# Patient Record
Sex: Female | Born: 1945
Health system: Southern US, Community
[De-identification: ages and names within clinical notes are randomized; demographics above are authoritative.]

## PROBLEM LIST (undated history)

## (undated) DIAGNOSIS — J189 Pneumonia, unspecified organism: Secondary | ICD-10-CM

## (undated) DIAGNOSIS — F329 Major depressive disorder, single episode, unspecified: Secondary | ICD-10-CM

## (undated) DIAGNOSIS — R51 Headache: Secondary | ICD-10-CM

## (undated) DIAGNOSIS — R0989 Other specified symptoms and signs involving the circulatory and respiratory systems: Secondary | ICD-10-CM

## (undated) DIAGNOSIS — F32A Depression, unspecified: Secondary | ICD-10-CM

## (undated) DIAGNOSIS — M1990 Unspecified osteoarthritis, unspecified site: Secondary | ICD-10-CM

## (undated) DIAGNOSIS — R200 Anesthesia of skin: Secondary | ICD-10-CM

## (undated) DIAGNOSIS — Z8701 Personal history of pneumonia (recurrent): Secondary | ICD-10-CM

## (undated) DIAGNOSIS — F419 Anxiety disorder, unspecified: Secondary | ICD-10-CM

## (undated) DIAGNOSIS — D649 Anemia, unspecified: Secondary | ICD-10-CM

## (undated) DIAGNOSIS — R519 Headache, unspecified: Secondary | ICD-10-CM

## (undated) DIAGNOSIS — E119 Type 2 diabetes mellitus without complications: Secondary | ICD-10-CM

## (undated) HISTORY — PX: EYE SURGERY: SHX253

## (undated) HISTORY — DX: Other specified symptoms and signs involving the circulatory and respiratory systems: R09.89

## (undated) HISTORY — PX: CHOLECYSTECTOMY: SHX55

## (undated) HISTORY — PX: MASTECTOMY: SHX3

## (undated) HISTORY — PX: SPINE SURGERY: SHX786

## (undated) HISTORY — PX: DILATION AND CURETTAGE OF UTERUS: SHX78

## (undated) HISTORY — PX: MENISCUS REPAIR: SHX5179

## (undated) HISTORY — PX: COLONOSCOPY: SHX174

## (undated) HISTORY — DX: Type 2 diabetes mellitus without complications: E11.9

## (undated) HISTORY — PX: BACK SURGERY: SHX140

## (undated) HISTORY — PX: ABDOMINAL HYSTERECTOMY: SHX81

## (undated) HISTORY — PX: ABCESS DRAINAGE: SHX399

---

## 2004-03-26 ENCOUNTER — Encounter: Admission: RE | Admit: 2004-03-26 | Discharge: 2004-03-26 | Payer: Self-pay | Admitting: Orthopedic Surgery

## 2004-03-26 ENCOUNTER — Emergency Department (HOSPITAL_COMMUNITY): Admission: EM | Admit: 2004-03-26 | Discharge: 2004-03-26 | Payer: Self-pay | Admitting: Emergency Medicine

## 2004-12-14 ENCOUNTER — Encounter: Admission: RE | Admit: 2004-12-14 | Discharge: 2005-03-14 | Payer: Self-pay | Admitting: Orthopedic Surgery

## 2005-07-18 ENCOUNTER — Other Ambulatory Visit: Admission: RE | Admit: 2005-07-18 | Discharge: 2005-07-18 | Payer: Self-pay | Admitting: Obstetrics and Gynecology

## 2005-07-27 ENCOUNTER — Encounter: Admission: RE | Admit: 2005-07-27 | Discharge: 2005-07-27 | Payer: Self-pay | Admitting: Obstetrics and Gynecology

## 2006-12-28 ENCOUNTER — Encounter: Admission: RE | Admit: 2006-12-28 | Discharge: 2006-12-28 | Payer: Self-pay | Admitting: Family Medicine

## 2007-01-29 ENCOUNTER — Encounter: Admission: RE | Admit: 2007-01-29 | Discharge: 2007-01-29 | Payer: Self-pay | Admitting: Family Medicine

## 2007-03-13 ENCOUNTER — Encounter: Admission: RE | Admit: 2007-03-13 | Discharge: 2007-03-13 | Payer: Self-pay | Admitting: Family Medicine

## 2007-06-26 ENCOUNTER — Encounter: Admission: RE | Admit: 2007-06-26 | Discharge: 2007-06-26 | Payer: Self-pay | Admitting: Family Medicine

## 2007-07-06 ENCOUNTER — Encounter: Admission: RE | Admit: 2007-07-06 | Discharge: 2007-07-06 | Payer: Self-pay | Admitting: Family Medicine

## 2007-12-31 ENCOUNTER — Encounter: Admission: RE | Admit: 2007-12-31 | Discharge: 2007-12-31 | Payer: Self-pay | Admitting: Obstetrics and Gynecology

## 2008-05-02 ENCOUNTER — Encounter: Admission: RE | Admit: 2008-05-02 | Discharge: 2008-05-02 | Payer: Self-pay | Admitting: Family Medicine

## 2009-01-02 ENCOUNTER — Encounter: Admission: RE | Admit: 2009-01-02 | Discharge: 2009-01-02 | Payer: Self-pay | Admitting: Obstetrics and Gynecology

## 2010-02-17 ENCOUNTER — Other Ambulatory Visit: Admission: RE | Admit: 2010-02-17 | Discharge: 2010-02-17 | Payer: Self-pay | Admitting: Obstetrics and Gynecology

## 2010-03-05 ENCOUNTER — Observation Stay (HOSPITAL_COMMUNITY): Admission: EM | Admit: 2010-03-05 | Discharge: 2010-03-08 | Payer: Self-pay | Admitting: Emergency Medicine

## 2010-03-05 ENCOUNTER — Ambulatory Visit: Payer: Self-pay | Admitting: Internal Medicine

## 2010-03-06 ENCOUNTER — Encounter (INDEPENDENT_AMBULATORY_CARE_PROVIDER_SITE_OTHER): Payer: Self-pay | Admitting: Internal Medicine

## 2010-03-08 ENCOUNTER — Encounter (INDEPENDENT_AMBULATORY_CARE_PROVIDER_SITE_OTHER): Payer: Self-pay | Admitting: Internal Medicine

## 2010-08-01 ENCOUNTER — Encounter: Payer: Self-pay | Admitting: Family Medicine

## 2010-09-24 LAB — DIFFERENTIAL
Basophils Absolute: 0.1 10*3/uL (ref 0.0–0.1)
Basophils Relative: 1 % (ref 0–1)
Eosinophils Absolute: 0.2 10*3/uL (ref 0.0–0.7)
Eosinophils Relative: 2 % (ref 0–5)
Lymphocytes Relative: 56 % — ABNORMAL HIGH (ref 12–46)
Lymphs Abs: 3.7 10*3/uL (ref 0.7–4.0)
Monocytes Absolute: 0.4 10*3/uL (ref 0.1–1.0)
Monocytes Relative: 7 % (ref 3–12)
Neutro Abs: 2.2 10*3/uL (ref 1.7–7.7)
Neutrophils Relative %: 34 % — ABNORMAL LOW (ref 43–77)

## 2010-09-24 LAB — URINE MICROSCOPIC-ADD ON

## 2010-09-24 LAB — POCT CARDIAC MARKERS
CKMB, poc: <1 ng/mL — ABNORMAL LOW (ref 1.0–8.0)
Myoglobin, poc: 41.1 ng/mL (ref 12–200)
Troponin i, poc: <0.05 ng/mL (ref 0.00–0.09)

## 2010-09-24 LAB — COMPREHENSIVE METABOLIC PANEL
ALT: 18 U/L (ref 0–35)
AST: 22 U/L (ref 0–37)
Albumin: 3.6 g/dL (ref 3.5–5.2)
Alkaline Phosphatase: 62 U/L (ref 39–117)
BUN: 7 mg/dL (ref 6–23)
CO2: 27 mEq/L (ref 19–32)
Calcium: 8.7 mg/dL (ref 8.4–10.5)
Chloride: 109 mEq/L (ref 96–112)
Creatinine, Ser: 0.61 mg/dL (ref 0.4–1.2)
GFR calc Af Amer: >60 mL/min (ref >60)
GFR calc non Af Amer: >60 mL/min (ref >60)
Glucose, Bld: 85 mg/dL (ref 70–99)
Potassium: 3.9 mEq/L (ref 3.5–5.1)
Sodium: 143 mEq/L (ref 135–145)
Total Bilirubin: 0.4 mg/dL (ref 0.3–1.2)
Total Protein: 6.1 g/dL (ref 6.0–8.3)

## 2010-09-24 LAB — BASIC METABOLIC PANEL
BUN: 9 mg/dL (ref 6–23)
CO2: 29 mEq/L (ref 19–32)
Calcium: 9.2 mg/dL (ref 8.4–10.5)
Chloride: 107 mEq/L (ref 96–112)
Creatinine, Ser: 0.65 mg/dL (ref 0.4–1.2)
GFR calc Af Amer: >60 mL/min (ref >60)
GFR calc non Af Amer: >60 mL/min (ref >60)
Glucose, Bld: 87 mg/dL (ref 70–99)
Potassium: 3.9 mEq/L (ref 3.5–5.1)
Sodium: 142 mEq/L (ref 135–145)

## 2010-09-24 LAB — URINALYSIS, ROUTINE W REFLEX MICROSCOPIC
Bilirubin Urine: NEGATIVE
Glucose, UA: NEGATIVE mg/dL
Hgb urine dipstick: NEGATIVE
Ketones, ur: NEGATIVE mg/dL
Nitrite: NEGATIVE
Protein, ur: NEGATIVE mg/dL
Specific Gravity, Urine: 1.007 (ref 1.005–1.030)
Urobilinogen, UA: 0.2 mg/dL (ref 0.0–1.0)
pH: 7.5 (ref 5.0–8.0)

## 2010-09-24 LAB — LIPID PANEL
Cholesterol: 136 mg/dL (ref 0–200)
HDL: 70 mg/dL (ref >39)
LDL Cholesterol: 42 mg/dL (ref 0–99)
Total CHOL/HDL Ratio: 1.9 RATIO
Triglycerides: 120 mg/dL (ref <150)
VLDL: 24 mg/dL (ref 0–40)

## 2010-09-24 LAB — CBC
HCT: 41.2 % (ref 36.0–46.0)
Hemoglobin: 13.3 g/dL (ref 12.0–15.0)
MCH: 28.2 pg (ref 26.0–34.0)
MCHC: 32.3 g/dL (ref 30.0–36.0)
MCV: 87.5 fL (ref 78.0–100.0)
Platelets: 214 10*3/uL (ref 150–400)
RBC: 4.71 MIL/uL (ref 3.87–5.11)
RDW: 13.4 % (ref 11.5–15.5)
WBC: 6.6 10*3/uL (ref 4.0–10.5)

## 2010-09-24 LAB — HEMOGLOBIN A1C
Hgb A1c MFr Bld: 6.1 % — ABNORMAL HIGH (ref <5.7)
Mean Plasma Glucose: 128 mg/dL — ABNORMAL HIGH (ref <117)

## 2010-09-24 LAB — TROPONIN I: Troponin I: <0.01 ng/mL (ref 0.00–0.06)

## 2010-09-24 LAB — PROTIME-INR
INR: 1.03 (ref 0.00–1.49)
Prothrombin Time: 13.7 seconds (ref 11.6–15.2)

## 2010-09-24 LAB — CK TOTAL AND CKMB (NOT AT ARMC)
CK, MB: 1.5 ng/mL (ref 0.3–4.0)
Relative Index: 0.7 (ref 0.0–2.5)
Total CK: 208 U/L — ABNORMAL HIGH (ref 7–177)

## 2010-09-24 LAB — GLUCOSE, CAPILLARY
Glucose-Capillary: 126 mg/dL — ABNORMAL HIGH (ref 70–99)
Glucose-Capillary: 94 mg/dL (ref 70–99)

## 2010-09-24 LAB — APTT: aPTT: 27 seconds (ref 24–37)

## 2011-08-03 DIAGNOSIS — H524 Presbyopia: Secondary | ICD-10-CM | POA: Diagnosis not present

## 2011-08-03 DIAGNOSIS — H2510 Age-related nuclear cataract, unspecified eye: Secondary | ICD-10-CM | POA: Diagnosis not present

## 2011-08-03 DIAGNOSIS — H52229 Regular astigmatism, unspecified eye: Secondary | ICD-10-CM | POA: Diagnosis not present

## 2011-08-03 DIAGNOSIS — Z23 Encounter for immunization: Secondary | ICD-10-CM | POA: Diagnosis not present

## 2011-08-03 DIAGNOSIS — H5231 Anisometropia: Secondary | ICD-10-CM | POA: Diagnosis not present

## 2011-08-11 DIAGNOSIS — M259 Joint disorder, unspecified: Secondary | ICD-10-CM | POA: Diagnosis not present

## 2011-08-12 ENCOUNTER — Other Ambulatory Visit: Payer: Self-pay | Admitting: Orthopedic Surgery

## 2011-08-12 DIAGNOSIS — M79671 Pain in right foot: Secondary | ICD-10-CM

## 2011-08-12 DIAGNOSIS — R531 Weakness: Secondary | ICD-10-CM

## 2011-08-15 DIAGNOSIS — M79609 Pain in unspecified limb: Secondary | ICD-10-CM | POA: Diagnosis not present

## 2011-08-15 DIAGNOSIS — R5381 Other malaise: Secondary | ICD-10-CM | POA: Diagnosis not present

## 2011-08-16 ENCOUNTER — Ambulatory Visit
Admission: RE | Admit: 2011-08-16 | Discharge: 2011-08-16 | Disposition: A | Discharge status: Home / Self Care | Payer: Medicare Other | Source: Ambulatory Visit | Attending: Orthopedic Surgery | Admitting: Orthopedic Surgery

## 2011-08-16 DIAGNOSIS — R531 Weakness: Secondary | ICD-10-CM

## 2011-08-16 DIAGNOSIS — R229 Localized swelling, mass and lump, unspecified: Secondary | ICD-10-CM | POA: Diagnosis not present

## 2011-08-16 DIAGNOSIS — M79609 Pain in unspecified limb: Secondary | ICD-10-CM | POA: Diagnosis not present

## 2011-08-16 DIAGNOSIS — M79671 Pain in right foot: Secondary | ICD-10-CM

## 2011-08-16 MED ORDER — GADOBENATE DIMEGLUMINE 529 MG/ML IV SOLN
7.0000 mL | Freq: Once | INTRAVENOUS | Status: AC | PRN
  Administered 2011-08-16: 7 mL via INTRAVENOUS

## 2011-08-25 DIAGNOSIS — M259 Joint disorder, unspecified: Secondary | ICD-10-CM | POA: Diagnosis not present

## 2011-10-14 DIAGNOSIS — A6000 Herpesviral infection of urogenital system, unspecified: Secondary | ICD-10-CM | POA: Diagnosis not present

## 2011-10-14 DIAGNOSIS — Z1211 Encounter for screening for malignant neoplasm of colon: Secondary | ICD-10-CM | POA: Diagnosis not present

## 2011-10-14 DIAGNOSIS — Z23 Encounter for immunization: Secondary | ICD-10-CM | POA: Diagnosis not present

## 2011-10-14 DIAGNOSIS — G5600 Carpal tunnel syndrome, unspecified upper limb: Secondary | ICD-10-CM | POA: Diagnosis not present

## 2011-10-14 DIAGNOSIS — Z79899 Other long term (current) drug therapy: Secondary | ICD-10-CM | POA: Diagnosis not present

## 2011-10-14 DIAGNOSIS — M1990 Unspecified osteoarthritis, unspecified site: Secondary | ICD-10-CM | POA: Diagnosis not present

## 2011-10-14 DIAGNOSIS — Z0000 Encounter for general adult medical examination without abnormal findings: Secondary | ICD-10-CM | POA: Diagnosis not present

## 2011-10-14 DIAGNOSIS — F329 Major depressive disorder, single episode, unspecified: Secondary | ICD-10-CM | POA: Diagnosis not present

## 2011-10-14 DIAGNOSIS — R3915 Urgency of urination: Secondary | ICD-10-CM | POA: Diagnosis not present

## 2011-10-19 DIAGNOSIS — M1710 Unilateral primary osteoarthritis, unspecified knee: Secondary | ICD-10-CM | POA: Diagnosis not present

## 2011-10-28 DIAGNOSIS — Z1211 Encounter for screening for malignant neoplasm of colon: Secondary | ICD-10-CM | POA: Diagnosis not present

## 2011-11-09 DIAGNOSIS — L723 Sebaceous cyst: Secondary | ICD-10-CM | POA: Diagnosis not present

## 2011-12-25 DIAGNOSIS — J069 Acute upper respiratory infection, unspecified: Secondary | ICD-10-CM | POA: Diagnosis not present

## 2012-04-29 DIAGNOSIS — Z23 Encounter for immunization: Secondary | ICD-10-CM | POA: Diagnosis not present

## 2012-05-09 ENCOUNTER — Other Ambulatory Visit: Payer: Self-pay | Admitting: Family Medicine

## 2012-05-09 ENCOUNTER — Ambulatory Visit
Admission: RE | Admit: 2012-05-09 | Discharge: 2012-05-09 | Disposition: A | Discharge status: Home / Self Care | Payer: Medicare Other | Source: Ambulatory Visit | Attending: Family Medicine | Admitting: Family Medicine

## 2012-05-09 DIAGNOSIS — F411 Generalized anxiety disorder: Secondary | ICD-10-CM | POA: Diagnosis not present

## 2012-05-09 DIAGNOSIS — M549 Dorsalgia, unspecified: Secondary | ICD-10-CM

## 2012-05-09 DIAGNOSIS — R32 Unspecified urinary incontinence: Secondary | ICD-10-CM | POA: Diagnosis not present

## 2012-05-09 DIAGNOSIS — M545 Low back pain, unspecified: Secondary | ICD-10-CM | POA: Diagnosis not present

## 2012-06-28 DIAGNOSIS — J019 Acute sinusitis, unspecified: Secondary | ICD-10-CM | POA: Diagnosis not present

## 2012-07-26 DIAGNOSIS — M25569 Pain in unspecified knee: Secondary | ICD-10-CM | POA: Diagnosis not present

## 2012-08-03 DIAGNOSIS — J329 Chronic sinusitis, unspecified: Secondary | ICD-10-CM | POA: Diagnosis not present

## 2012-08-03 DIAGNOSIS — R509 Fever, unspecified: Secondary | ICD-10-CM | POA: Diagnosis not present

## 2012-09-24 ENCOUNTER — Other Ambulatory Visit: Payer: Self-pay | Admitting: Orthopedic Surgery

## 2012-09-24 DIAGNOSIS — M25562 Pain in left knee: Secondary | ICD-10-CM

## 2012-09-24 DIAGNOSIS — R609 Edema, unspecified: Secondary | ICD-10-CM

## 2012-09-24 DIAGNOSIS — R531 Weakness: Secondary | ICD-10-CM

## 2012-09-26 ENCOUNTER — Ambulatory Visit
Admission: RE | Admit: 2012-09-26 | Discharge: 2012-09-26 | Disposition: A | Discharge status: Home / Self Care | Payer: Medicare Other | Source: Ambulatory Visit | Attending: Orthopedic Surgery | Admitting: Orthopedic Surgery

## 2012-09-26 DIAGNOSIS — M25562 Pain in left knee: Secondary | ICD-10-CM

## 2012-09-26 DIAGNOSIS — M1710 Unilateral primary osteoarthritis, unspecified knee: Secondary | ICD-10-CM | POA: Diagnosis not present

## 2012-09-26 DIAGNOSIS — R531 Weakness: Secondary | ICD-10-CM

## 2012-09-26 DIAGNOSIS — M23359 Other meniscus derangements, posterior horn of lateral meniscus, unspecified knee: Secondary | ICD-10-CM | POA: Diagnosis not present

## 2012-09-26 DIAGNOSIS — IMO0002 Reserved for concepts with insufficient information to code with codable children: Secondary | ICD-10-CM | POA: Diagnosis not present

## 2012-09-26 DIAGNOSIS — R609 Edema, unspecified: Secondary | ICD-10-CM

## 2012-10-08 DIAGNOSIS — M23359 Other meniscus derangements, posterior horn of lateral meniscus, unspecified knee: Secondary | ICD-10-CM | POA: Diagnosis not present

## 2012-10-18 DIAGNOSIS — A088 Other specified intestinal infections: Secondary | ICD-10-CM | POA: Diagnosis not present

## 2012-10-22 DIAGNOSIS — Y929 Unspecified place or not applicable: Secondary | ICD-10-CM | POA: Diagnosis not present

## 2012-10-22 DIAGNOSIS — M659 Synovitis and tenosynovitis, unspecified: Secondary | ICD-10-CM | POA: Diagnosis not present

## 2012-10-22 DIAGNOSIS — Y999 Unspecified external cause status: Secondary | ICD-10-CM | POA: Diagnosis not present

## 2012-10-22 DIAGNOSIS — M2240 Chondromalacia patellae, unspecified knee: Secondary | ICD-10-CM | POA: Diagnosis not present

## 2012-10-22 DIAGNOSIS — M9420 Chondromalacia, unspecified site: Secondary | ICD-10-CM | POA: Diagnosis not present

## 2012-10-22 DIAGNOSIS — X58XXXA Exposure to other specified factors, initial encounter: Secondary | ICD-10-CM | POA: Diagnosis not present

## 2012-10-22 DIAGNOSIS — S83289A Other tear of lateral meniscus, current injury, unspecified knee, initial encounter: Secondary | ICD-10-CM | POA: Diagnosis not present

## 2012-10-22 DIAGNOSIS — M23359 Other meniscus derangements, posterior horn of lateral meniscus, unspecified knee: Secondary | ICD-10-CM | POA: Diagnosis not present

## 2012-10-22 DIAGNOSIS — M1710 Unilateral primary osteoarthritis, unspecified knee: Secondary | ICD-10-CM | POA: Diagnosis not present

## 2012-11-01 ENCOUNTER — Other Ambulatory Visit: Payer: Self-pay | Admitting: Family Medicine

## 2012-11-01 DIAGNOSIS — Z0000 Encounter for general adult medical examination without abnormal findings: Secondary | ICD-10-CM | POA: Diagnosis not present

## 2012-11-01 DIAGNOSIS — F329 Major depressive disorder, single episode, unspecified: Secondary | ICD-10-CM | POA: Diagnosis not present

## 2012-11-01 DIAGNOSIS — Z1231 Encounter for screening mammogram for malignant neoplasm of breast: Secondary | ICD-10-CM

## 2012-11-01 DIAGNOSIS — A6000 Herpesviral infection of urogenital system, unspecified: Secondary | ICD-10-CM | POA: Diagnosis not present

## 2012-11-01 DIAGNOSIS — Z13220 Encounter for screening for lipoid disorders: Secondary | ICD-10-CM | POA: Diagnosis not present

## 2012-11-01 DIAGNOSIS — Z79899 Other long term (current) drug therapy: Secondary | ICD-10-CM | POA: Diagnosis not present

## 2012-11-01 DIAGNOSIS — M1990 Unspecified osteoarthritis, unspecified site: Secondary | ICD-10-CM | POA: Diagnosis not present

## 2012-11-01 DIAGNOSIS — M949 Disorder of cartilage, unspecified: Secondary | ICD-10-CM | POA: Diagnosis not present

## 2012-11-01 DIAGNOSIS — M899 Disorder of bone, unspecified: Secondary | ICD-10-CM | POA: Diagnosis not present

## 2012-11-01 DIAGNOSIS — Z1211 Encounter for screening for malignant neoplasm of colon: Secondary | ICD-10-CM | POA: Diagnosis not present

## 2012-11-02 DIAGNOSIS — Z1211 Encounter for screening for malignant neoplasm of colon: Secondary | ICD-10-CM | POA: Diagnosis not present

## 2012-11-08 ENCOUNTER — Ambulatory Visit
Admission: RE | Admit: 2012-11-08 | Discharge: 2012-11-08 | Disposition: A | Discharge status: Home / Self Care | Payer: Medicare Other | Source: Ambulatory Visit | Attending: Family Medicine | Admitting: Family Medicine

## 2012-11-08 DIAGNOSIS — Z1231 Encounter for screening mammogram for malignant neoplasm of breast: Secondary | ICD-10-CM | POA: Diagnosis not present

## 2012-11-19 ENCOUNTER — Other Ambulatory Visit (HOSPITAL_COMMUNITY): Payer: Self-pay | Admitting: Orthopedic Surgery

## 2012-11-19 ENCOUNTER — Ambulatory Visit (HOSPITAL_COMMUNITY)
Admission: RE | Admit: 2012-11-19 | Discharge: 2012-11-19 | Disposition: A | Discharge status: Home / Self Care | Payer: Medicare Other | Source: Ambulatory Visit | Attending: Orthopedic Surgery | Admitting: Orthopedic Surgery

## 2012-11-19 DIAGNOSIS — M7989 Other specified soft tissue disorders: Secondary | ICD-10-CM

## 2012-11-19 DIAGNOSIS — M7120 Synovial cyst of popliteal space [Baker], unspecified knee: Secondary | ICD-10-CM | POA: Insufficient documentation

## 2012-11-19 DIAGNOSIS — M25562 Pain in left knee: Secondary | ICD-10-CM

## 2012-11-19 DIAGNOSIS — M79609 Pain in unspecified limb: Secondary | ICD-10-CM | POA: Insufficient documentation

## 2012-11-19 NOTE — Progress Notes (Signed)
*  PRELIMINARY RESULTS* Vascular Ultrasound Left lower extremity venous duplex has been completed.  Preliminary findings: Left = no evidence of DVT. A ruptured baker's cyst is noted extending from the popliteal fossa to the proximal calf.  Called report to [facility] at Dr. Diamantina Providence office.   Farrel Demark, RDMS, RVT  11/19/2012, 4:11 PM

## 2012-11-28 DIAGNOSIS — M23359 Other meniscus derangements, posterior horn of lateral meniscus, unspecified knee: Secondary | ICD-10-CM | POA: Diagnosis not present

## 2012-12-06 DIAGNOSIS — M899 Disorder of bone, unspecified: Secondary | ICD-10-CM | POA: Diagnosis not present

## 2013-01-23 DIAGNOSIS — M25569 Pain in unspecified knee: Secondary | ICD-10-CM | POA: Diagnosis not present

## 2013-01-23 DIAGNOSIS — M1710 Unilateral primary osteoarthritis, unspecified knee: Secondary | ICD-10-CM | POA: Diagnosis not present

## 2013-01-23 DIAGNOSIS — M23359 Other meniscus derangements, posterior horn of lateral meniscus, unspecified knee: Secondary | ICD-10-CM | POA: Diagnosis not present

## 2013-05-08 DIAGNOSIS — F329 Major depressive disorder, single episode, unspecified: Secondary | ICD-10-CM | POA: Diagnosis not present

## 2013-06-30 DIAGNOSIS — Z23 Encounter for immunization: Secondary | ICD-10-CM | POA: Diagnosis not present

## 2013-07-31 ENCOUNTER — Ambulatory Visit (HOSPITAL_COMMUNITY): Payer: Medicare Other | Admitting: Psychiatry

## 2013-09-18 DIAGNOSIS — J329 Chronic sinusitis, unspecified: Secondary | ICD-10-CM | POA: Diagnosis not present

## 2013-11-15 ENCOUNTER — Other Ambulatory Visit: Payer: Self-pay

## 2013-11-15 DIAGNOSIS — Z1231 Encounter for screening mammogram for malignant neoplasm of breast: Secondary | ICD-10-CM

## 2013-11-19 DIAGNOSIS — H11159 Pinguecula, unspecified eye: Secondary | ICD-10-CM | POA: Diagnosis not present

## 2013-11-19 DIAGNOSIS — H2510 Age-related nuclear cataract, unspecified eye: Secondary | ICD-10-CM | POA: Diagnosis not present

## 2013-11-19 DIAGNOSIS — H524 Presbyopia: Secondary | ICD-10-CM | POA: Diagnosis not present

## 2013-11-19 DIAGNOSIS — H5231 Anisometropia: Secondary | ICD-10-CM | POA: Diagnosis not present

## 2013-11-22 ENCOUNTER — Encounter (INDEPENDENT_AMBULATORY_CARE_PROVIDER_SITE_OTHER): Payer: Self-pay

## 2013-11-22 ENCOUNTER — Ambulatory Visit
Admission: RE | Admit: 2013-11-22 | Discharge: 2013-11-22 | Disposition: A | Discharge status: Home / Self Care | Payer: Medicare Other | Source: Ambulatory Visit

## 2013-11-22 DIAGNOSIS — Z1231 Encounter for screening mammogram for malignant neoplasm of breast: Secondary | ICD-10-CM | POA: Diagnosis not present

## 2013-12-03 ENCOUNTER — Other Ambulatory Visit: Payer: Self-pay | Admitting: Family Medicine

## 2013-12-03 ENCOUNTER — Ambulatory Visit
Admission: RE | Admit: 2013-12-03 | Discharge: 2013-12-03 | Disposition: A | Discharge status: Home / Self Care | Payer: Medicare Other | Source: Ambulatory Visit | Attending: Family Medicine | Admitting: Family Medicine

## 2013-12-03 DIAGNOSIS — R229 Localized swelling, mass and lump, unspecified: Secondary | ICD-10-CM | POA: Diagnosis not present

## 2013-12-03 DIAGNOSIS — M47812 Spondylosis without myelopathy or radiculopathy, cervical region: Secondary | ICD-10-CM | POA: Diagnosis not present

## 2013-12-03 DIAGNOSIS — M25519 Pain in unspecified shoulder: Secondary | ICD-10-CM | POA: Diagnosis not present

## 2013-12-03 DIAGNOSIS — M25512 Pain in left shoulder: Secondary | ICD-10-CM

## 2013-12-03 DIAGNOSIS — M542 Cervicalgia: Secondary | ICD-10-CM

## 2013-12-04 DIAGNOSIS — M25569 Pain in unspecified knee: Secondary | ICD-10-CM | POA: Diagnosis not present

## 2013-12-04 DIAGNOSIS — M25519 Pain in unspecified shoulder: Secondary | ICD-10-CM | POA: Diagnosis not present

## 2013-12-05 ENCOUNTER — Ambulatory Visit
Admission: RE | Admit: 2013-12-05 | Discharge: 2013-12-05 | Disposition: A | Discharge status: Home / Self Care | Payer: Medicare Other | Source: Ambulatory Visit | Attending: Family Medicine | Admitting: Family Medicine

## 2013-12-05 DIAGNOSIS — D1779 Benign lipomatous neoplasm of other sites: Secondary | ICD-10-CM | POA: Diagnosis not present

## 2013-12-05 DIAGNOSIS — R229 Localized swelling, mass and lump, unspecified: Secondary | ICD-10-CM

## 2014-01-20 DIAGNOSIS — K1370 Unspecified lesions of oral mucosa: Secondary | ICD-10-CM | POA: Diagnosis not present

## 2014-01-20 DIAGNOSIS — D179 Benign lipomatous neoplasm, unspecified: Secondary | ICD-10-CM | POA: Diagnosis not present

## 2014-03-10 DIAGNOSIS — M1710 Unilateral primary osteoarthritis, unspecified knee: Secondary | ICD-10-CM | POA: Diagnosis not present

## 2014-05-12 DIAGNOSIS — D3709 Neoplasm of uncertain behavior of other specified sites of the oral cavity: Secondary | ICD-10-CM | POA: Diagnosis not present

## 2014-06-11 ENCOUNTER — Other Ambulatory Visit: Payer: Self-pay | Admitting: Otolaryngology

## 2014-06-11 DIAGNOSIS — D210 Benign neoplasm of connective and other soft tissue of head, face and neck: Secondary | ICD-10-CM | POA: Diagnosis not present

## 2014-06-11 DIAGNOSIS — D3709 Neoplasm of uncertain behavior of other specified sites of the oral cavity: Secondary | ICD-10-CM | POA: Diagnosis not present

## 2014-06-25 DIAGNOSIS — R609 Edema, unspecified: Secondary | ICD-10-CM | POA: Diagnosis not present

## 2014-06-25 DIAGNOSIS — M791 Myalgia: Secondary | ICD-10-CM | POA: Diagnosis not present

## 2014-06-25 DIAGNOSIS — Z23 Encounter for immunization: Secondary | ICD-10-CM | POA: Diagnosis not present

## 2014-07-02 DIAGNOSIS — M1712 Unilateral primary osteoarthritis, left knee: Secondary | ICD-10-CM | POA: Diagnosis not present

## 2014-07-02 DIAGNOSIS — M1711 Unilateral primary osteoarthritis, right knee: Secondary | ICD-10-CM | POA: Diagnosis not present

## 2014-07-02 DIAGNOSIS — M545 Low back pain: Secondary | ICD-10-CM | POA: Diagnosis not present

## 2014-07-09 ENCOUNTER — Other Ambulatory Visit: Payer: Self-pay | Admitting: Orthopedic Surgery

## 2014-07-09 DIAGNOSIS — M545 Low back pain: Secondary | ICD-10-CM

## 2014-07-13 ENCOUNTER — Ambulatory Visit
Admission: RE | Admit: 2014-07-13 | Discharge: 2014-07-13 | Disposition: A | Discharge status: Home / Self Care | Payer: Medicare Other | Source: Ambulatory Visit | Attending: Orthopedic Surgery | Admitting: Orthopedic Surgery

## 2014-07-13 DIAGNOSIS — M545 Low back pain: Secondary | ICD-10-CM

## 2014-07-13 DIAGNOSIS — M47816 Spondylosis without myelopathy or radiculopathy, lumbar region: Secondary | ICD-10-CM | POA: Diagnosis not present

## 2014-07-13 DIAGNOSIS — M4806 Spinal stenosis, lumbar region: Secondary | ICD-10-CM | POA: Diagnosis not present

## 2014-07-18 DIAGNOSIS — M545 Low back pain: Secondary | ICD-10-CM | POA: Diagnosis not present

## 2014-07-18 DIAGNOSIS — M4726 Other spondylosis with radiculopathy, lumbar region: Secondary | ICD-10-CM | POA: Diagnosis not present

## 2014-07-23 DIAGNOSIS — F322 Major depressive disorder, single episode, severe without psychotic features: Secondary | ICD-10-CM | POA: Diagnosis not present

## 2014-07-23 DIAGNOSIS — Z1211 Encounter for screening for malignant neoplasm of colon: Secondary | ICD-10-CM | POA: Diagnosis not present

## 2014-07-23 DIAGNOSIS — Z0000 Encounter for general adult medical examination without abnormal findings: Secondary | ICD-10-CM | POA: Diagnosis not present

## 2014-07-23 DIAGNOSIS — J301 Allergic rhinitis due to pollen: Secondary | ICD-10-CM | POA: Diagnosis not present

## 2014-07-23 DIAGNOSIS — Z79899 Other long term (current) drug therapy: Secondary | ICD-10-CM | POA: Diagnosis not present

## 2014-07-23 DIAGNOSIS — M8580 Other specified disorders of bone density and structure, unspecified site: Secondary | ICD-10-CM | POA: Diagnosis not present

## 2014-07-23 DIAGNOSIS — M1990 Unspecified osteoarthritis, unspecified site: Secondary | ICD-10-CM | POA: Diagnosis not present

## 2014-07-23 DIAGNOSIS — R6889 Other general symptoms and signs: Secondary | ICD-10-CM | POA: Diagnosis not present

## 2014-07-23 DIAGNOSIS — A6000 Herpesviral infection of urogenital system, unspecified: Secondary | ICD-10-CM | POA: Diagnosis not present

## 2014-07-23 DIAGNOSIS — M549 Dorsalgia, unspecified: Secondary | ICD-10-CM | POA: Diagnosis not present

## 2014-07-23 DIAGNOSIS — Z23 Encounter for immunization: Secondary | ICD-10-CM | POA: Diagnosis not present

## 2014-07-31 DIAGNOSIS — Z1211 Encounter for screening for malignant neoplasm of colon: Secondary | ICD-10-CM | POA: Diagnosis not present

## 2014-09-28 DIAGNOSIS — J209 Acute bronchitis, unspecified: Secondary | ICD-10-CM | POA: Diagnosis not present

## 2014-10-08 DIAGNOSIS — K921 Melena: Secondary | ICD-10-CM | POA: Diagnosis not present

## 2014-10-14 ENCOUNTER — Other Ambulatory Visit: Payer: Self-pay | Admitting: Gastroenterology

## 2014-10-14 DIAGNOSIS — K635 Polyp of colon: Secondary | ICD-10-CM | POA: Diagnosis not present

## 2014-10-14 DIAGNOSIS — D125 Benign neoplasm of sigmoid colon: Secondary | ICD-10-CM | POA: Diagnosis not present

## 2014-10-14 DIAGNOSIS — K5730 Diverticulosis of large intestine without perforation or abscess without bleeding: Secondary | ICD-10-CM | POA: Diagnosis not present

## 2014-10-14 DIAGNOSIS — K921 Melena: Secondary | ICD-10-CM | POA: Diagnosis not present

## 2014-10-20 DIAGNOSIS — F322 Major depressive disorder, single episode, severe without psychotic features: Secondary | ICD-10-CM | POA: Diagnosis not present

## 2014-11-05 DIAGNOSIS — M4726 Other spondylosis with radiculopathy, lumbar region: Secondary | ICD-10-CM | POA: Diagnosis not present

## 2014-11-05 DIAGNOSIS — M545 Low back pain: Secondary | ICD-10-CM | POA: Diagnosis not present

## 2014-11-25 DIAGNOSIS — Z6831 Body mass index (BMI) 31.0-31.9, adult: Secondary | ICD-10-CM | POA: Diagnosis not present

## 2014-11-25 DIAGNOSIS — M5137 Other intervertebral disc degeneration, lumbosacral region: Secondary | ICD-10-CM | POA: Diagnosis not present

## 2014-11-25 DIAGNOSIS — M5416 Radiculopathy, lumbar region: Secondary | ICD-10-CM | POA: Diagnosis not present

## 2015-01-01 DIAGNOSIS — M6281 Muscle weakness (generalized): Secondary | ICD-10-CM | POA: Diagnosis not present

## 2015-01-01 DIAGNOSIS — M79604 Pain in right leg: Secondary | ICD-10-CM | POA: Diagnosis not present

## 2015-01-01 DIAGNOSIS — M545 Low back pain: Secondary | ICD-10-CM | POA: Diagnosis not present

## 2015-01-01 DIAGNOSIS — R262 Difficulty in walking, not elsewhere classified: Secondary | ICD-10-CM | POA: Diagnosis not present

## 2015-01-06 DIAGNOSIS — M545 Low back pain: Secondary | ICD-10-CM | POA: Diagnosis not present

## 2015-01-06 DIAGNOSIS — M79604 Pain in right leg: Secondary | ICD-10-CM | POA: Diagnosis not present

## 2015-01-06 DIAGNOSIS — M6281 Muscle weakness (generalized): Secondary | ICD-10-CM | POA: Diagnosis not present

## 2015-01-06 DIAGNOSIS — R262 Difficulty in walking, not elsewhere classified: Secondary | ICD-10-CM | POA: Diagnosis not present

## 2015-01-08 DIAGNOSIS — M79604 Pain in right leg: Secondary | ICD-10-CM | POA: Diagnosis not present

## 2015-01-08 DIAGNOSIS — M545 Low back pain: Secondary | ICD-10-CM | POA: Diagnosis not present

## 2015-01-08 DIAGNOSIS — R262 Difficulty in walking, not elsewhere classified: Secondary | ICD-10-CM | POA: Diagnosis not present

## 2015-01-08 DIAGNOSIS — M6281 Muscle weakness (generalized): Secondary | ICD-10-CM | POA: Diagnosis not present

## 2015-01-13 DIAGNOSIS — R262 Difficulty in walking, not elsewhere classified: Secondary | ICD-10-CM | POA: Diagnosis not present

## 2015-01-13 DIAGNOSIS — M545 Low back pain: Secondary | ICD-10-CM | POA: Diagnosis not present

## 2015-01-13 DIAGNOSIS — M79604 Pain in right leg: Secondary | ICD-10-CM | POA: Diagnosis not present

## 2015-01-13 DIAGNOSIS — M6281 Muscle weakness (generalized): Secondary | ICD-10-CM | POA: Diagnosis not present

## 2015-01-15 DIAGNOSIS — Z6830 Body mass index (BMI) 30.0-30.9, adult: Secondary | ICD-10-CM | POA: Diagnosis not present

## 2015-01-15 DIAGNOSIS — M5137 Other intervertebral disc degeneration, lumbosacral region: Secondary | ICD-10-CM | POA: Diagnosis not present

## 2015-01-20 DIAGNOSIS — M79604 Pain in right leg: Secondary | ICD-10-CM | POA: Diagnosis not present

## 2015-01-20 DIAGNOSIS — M6281 Muscle weakness (generalized): Secondary | ICD-10-CM | POA: Diagnosis not present

## 2015-01-20 DIAGNOSIS — M545 Low back pain: Secondary | ICD-10-CM | POA: Diagnosis not present

## 2015-01-20 DIAGNOSIS — R262 Difficulty in walking, not elsewhere classified: Secondary | ICD-10-CM | POA: Diagnosis not present

## 2015-01-22 DIAGNOSIS — H5201 Hypermetropia, right eye: Secondary | ICD-10-CM | POA: Diagnosis not present

## 2015-01-22 DIAGNOSIS — M545 Low back pain: Secondary | ICD-10-CM | POA: Diagnosis not present

## 2015-01-22 DIAGNOSIS — H5212 Myopia, left eye: Secondary | ICD-10-CM | POA: Diagnosis not present

## 2015-01-22 DIAGNOSIS — R262 Difficulty in walking, not elsewhere classified: Secondary | ICD-10-CM | POA: Diagnosis not present

## 2015-01-22 DIAGNOSIS — M79604 Pain in right leg: Secondary | ICD-10-CM | POA: Diagnosis not present

## 2015-01-22 DIAGNOSIS — H04123 Dry eye syndrome of bilateral lacrimal glands: Secondary | ICD-10-CM | POA: Diagnosis not present

## 2015-01-22 DIAGNOSIS — H524 Presbyopia: Secondary | ICD-10-CM | POA: Diagnosis not present

## 2015-01-22 DIAGNOSIS — H52222 Regular astigmatism, left eye: Secondary | ICD-10-CM | POA: Diagnosis not present

## 2015-01-22 DIAGNOSIS — M6281 Muscle weakness (generalized): Secondary | ICD-10-CM | POA: Diagnosis not present

## 2015-01-27 DIAGNOSIS — R262 Difficulty in walking, not elsewhere classified: Secondary | ICD-10-CM | POA: Diagnosis not present

## 2015-01-27 DIAGNOSIS — M6281 Muscle weakness (generalized): Secondary | ICD-10-CM | POA: Diagnosis not present

## 2015-01-27 DIAGNOSIS — M545 Low back pain: Secondary | ICD-10-CM | POA: Diagnosis not present

## 2015-01-27 DIAGNOSIS — M79604 Pain in right leg: Secondary | ICD-10-CM | POA: Diagnosis not present

## 2015-02-03 DIAGNOSIS — M545 Low back pain: Secondary | ICD-10-CM | POA: Diagnosis not present

## 2015-02-03 DIAGNOSIS — R262 Difficulty in walking, not elsewhere classified: Secondary | ICD-10-CM | POA: Diagnosis not present

## 2015-02-03 DIAGNOSIS — M79604 Pain in right leg: Secondary | ICD-10-CM | POA: Diagnosis not present

## 2015-02-03 DIAGNOSIS — M6281 Muscle weakness (generalized): Secondary | ICD-10-CM | POA: Diagnosis not present

## 2015-02-05 DIAGNOSIS — R262 Difficulty in walking, not elsewhere classified: Secondary | ICD-10-CM | POA: Diagnosis not present

## 2015-02-05 DIAGNOSIS — M6281 Muscle weakness (generalized): Secondary | ICD-10-CM | POA: Diagnosis not present

## 2015-02-05 DIAGNOSIS — M79604 Pain in right leg: Secondary | ICD-10-CM | POA: Diagnosis not present

## 2015-02-05 DIAGNOSIS — M545 Low back pain: Secondary | ICD-10-CM | POA: Diagnosis not present

## 2015-02-15 DIAGNOSIS — M79671 Pain in right foot: Secondary | ICD-10-CM | POA: Diagnosis not present

## 2015-02-17 DIAGNOSIS — M79604 Pain in right leg: Secondary | ICD-10-CM | POA: Diagnosis not present

## 2015-02-17 DIAGNOSIS — R262 Difficulty in walking, not elsewhere classified: Secondary | ICD-10-CM | POA: Diagnosis not present

## 2015-02-17 DIAGNOSIS — M6281 Muscle weakness (generalized): Secondary | ICD-10-CM | POA: Diagnosis not present

## 2015-02-17 DIAGNOSIS — M545 Low back pain: Secondary | ICD-10-CM | POA: Diagnosis not present

## 2015-02-19 ENCOUNTER — Other Ambulatory Visit: Payer: Self-pay

## 2015-02-19 DIAGNOSIS — Z1231 Encounter for screening mammogram for malignant neoplasm of breast: Secondary | ICD-10-CM

## 2015-02-24 DIAGNOSIS — M6281 Muscle weakness (generalized): Secondary | ICD-10-CM | POA: Diagnosis not present

## 2015-02-24 DIAGNOSIS — M545 Low back pain: Secondary | ICD-10-CM | POA: Diagnosis not present

## 2015-02-24 DIAGNOSIS — R262 Difficulty in walking, not elsewhere classified: Secondary | ICD-10-CM | POA: Diagnosis not present

## 2015-02-24 DIAGNOSIS — M79604 Pain in right leg: Secondary | ICD-10-CM | POA: Diagnosis not present

## 2015-02-25 DIAGNOSIS — R262 Difficulty in walking, not elsewhere classified: Secondary | ICD-10-CM | POA: Diagnosis not present

## 2015-02-25 DIAGNOSIS — M79604 Pain in right leg: Secondary | ICD-10-CM | POA: Diagnosis not present

## 2015-02-25 DIAGNOSIS — M6281 Muscle weakness (generalized): Secondary | ICD-10-CM | POA: Diagnosis not present

## 2015-02-25 DIAGNOSIS — M545 Low back pain: Secondary | ICD-10-CM | POA: Diagnosis not present

## 2015-03-10 DIAGNOSIS — M545 Low back pain: Secondary | ICD-10-CM | POA: Diagnosis not present

## 2015-03-10 DIAGNOSIS — M79604 Pain in right leg: Secondary | ICD-10-CM | POA: Diagnosis not present

## 2015-03-10 DIAGNOSIS — R262 Difficulty in walking, not elsewhere classified: Secondary | ICD-10-CM | POA: Diagnosis not present

## 2015-03-10 DIAGNOSIS — M6281 Muscle weakness (generalized): Secondary | ICD-10-CM | POA: Diagnosis not present

## 2015-03-17 ENCOUNTER — Other Ambulatory Visit: Payer: Self-pay | Admitting: Neurosurgery

## 2015-03-17 DIAGNOSIS — M4316 Spondylolisthesis, lumbar region: Secondary | ICD-10-CM | POA: Diagnosis not present

## 2015-03-17 DIAGNOSIS — M4806 Spinal stenosis, lumbar region: Secondary | ICD-10-CM | POA: Diagnosis not present

## 2015-03-19 ENCOUNTER — Ambulatory Visit
Admission: RE | Admit: 2015-03-19 | Discharge: 2015-03-19 | Disposition: A | Discharge status: Home / Self Care | Payer: Medicare Other | Source: Ambulatory Visit

## 2015-03-19 DIAGNOSIS — Z1231 Encounter for screening mammogram for malignant neoplasm of breast: Secondary | ICD-10-CM

## 2015-05-21 NOTE — Pre-Procedure Instructions (Addendum)
    Haydee Standing  05/21/2015      [id number] [place], [place] [phone] [facility] [address] [place] [address] Phone: [phone] Fax: [phone]    Your procedure is scheduled on Monday, November 21st, 2016.  Report to [facility] Admitting at 5:30 A.M.  Call this number if you have problems the morning of surgery:  [phone]   Remember:  Do not eat food or drink liquids after midnight.   Take these medicines the morning of surgery with A SIP OF WATER: Acetaminophen-codeine (Tylenol #3), Refresh Tears, Sertraline (Zoloft), Nasocort Allergy  7 days prior to surgery, stop taking: Meloxicam (Mobic), Aspirin, Aleve, Naproxen, Ibuprofen, Advil, Motrin, NSAIDS, fish oil, all herbal medications, and all vitamins.    Do not wear jewelry, make-up or nail polish.  Do not wear lotions, powders, or perfumes.  You may NOT wear deodorant.  Do not shave 48 hours prior to surgery.    Do not bring valuables to the hospital.  [facility] is not responsible for any belongings or valuables.  Contacts, dentures or bridgework may not be worn into surgery.  Leave your suitcase in the car.  After surgery it may be brought to your room.  For patients admitted to the hospital, discharge time will be determined by your treatment team.  Patients discharged the day of surgery will not be allowed to drive home.   Special instructions:  See attached.   Please read over the following fact sheets that you were given. Pain Booklet, Coughing and Deep Breathing, MRSA Information and Surgical Site Infection Prevention

## 2015-05-22 ENCOUNTER — Encounter (HOSPITAL_COMMUNITY)
Admission: RE | Admit: 2015-05-22 | Discharge: 2015-05-22 | Disposition: A | Discharge status: Home / Self Care | Payer: Medicare Other | Source: Ambulatory Visit | Attending: Neurosurgery | Admitting: Neurosurgery

## 2015-05-22 ENCOUNTER — Encounter (HOSPITAL_COMMUNITY): Payer: Self-pay

## 2015-05-22 DIAGNOSIS — Z0183 Encounter for blood typing: Secondary | ICD-10-CM | POA: Diagnosis not present

## 2015-05-22 DIAGNOSIS — Z01812 Encounter for preprocedural laboratory examination: Secondary | ICD-10-CM | POA: Diagnosis not present

## 2015-05-22 HISTORY — DX: Headache, unspecified: R51.9

## 2015-05-22 HISTORY — DX: Depression, unspecified: F32.A

## 2015-05-22 HISTORY — DX: Unspecified osteoarthritis, unspecified site: M19.90

## 2015-05-22 HISTORY — DX: Major depressive disorder, single episode, unspecified: F32.9

## 2015-05-22 HISTORY — DX: Headache: R51

## 2015-05-22 HISTORY — DX: Personal history of pneumonia (recurrent): Z87.01

## 2015-05-22 HISTORY — DX: Anesthesia of skin: R20.0

## 2015-05-22 HISTORY — DX: Anxiety disorder, unspecified: F41.9

## 2015-05-22 LAB — CBC
HCT: 42.1 % (ref 36.0–46.0)
Hemoglobin: 13.6 g/dL (ref 12.0–15.0)
MCH: 27.3 pg (ref 26.0–34.0)
MCHC: 32.3 g/dL (ref 30.0–36.0)
MCV: 84.5 fL (ref 78.0–100.0)
Platelets: 225 10*3/uL (ref 150–400)
RBC: 4.98 MIL/uL (ref 3.87–5.11)
RDW: 13.3 % (ref 11.5–15.5)
WBC: 5.7 10*3/uL (ref 4.0–10.5)

## 2015-05-22 LAB — BASIC METABOLIC PANEL
Anion gap: 9 (ref 5–15)
BUN: 10 mg/dL (ref 6–20)
CO2: 22 mmol/L (ref 22–32)
Calcium: 9.4 mg/dL (ref 8.9–10.3)
Chloride: 108 mmol/L (ref 101–111)
Creatinine, Ser: 0.55 mg/dL (ref 0.44–1.00)
GFR calc Af Amer: >60 mL/min (ref >60)
GFR calc non Af Amer: >60 mL/min (ref >60)
Glucose, Bld: 156 mg/dL — ABNORMAL HIGH (ref 65–99)
Potassium: 4.2 mmol/L (ref 3.5–5.1)
Sodium: 139 mmol/L (ref 135–145)

## 2015-05-22 LAB — SURGICAL PCR SCREEN
MRSA, PCR: NEGATIVE
Staphylococcus aureus: NEGATIVE

## 2015-05-22 LAB — TYPE AND SCREEN
ABO/RH(D): A POS
Antibody Screen: NEGATIVE

## 2015-05-22 LAB — ABO/RH: ABO/RH(D): A POS

## 2015-05-22 NOTE — Progress Notes (Signed)
PCP- Dr. Mayra Neer Cardiologist - denies  EKG/CXR - denies  Echo- denies Stress test - requested Cardiac Cath - denies  Patient denies chest pain and shortness of breath at PAT appointment.  Patient was tearful at PAT appointment and stated that she just felt alone due to loss of parents, siblings, aunts, cousins.  Patient states that she has not had thoughts of harming self or others.  Patient verbalizes that she does have a support system at home - husband, pastor, and friends.  Patient states that she is going to call Dr. Brigitte Pulse today to inform her of the upcoming surgery and how she has been feeling.

## 2015-06-01 ENCOUNTER — Encounter (HOSPITAL_COMMUNITY): Payer: Self-pay | Admitting: *Deleted

## 2015-06-01 ENCOUNTER — Inpatient Hospital Stay (HOSPITAL_COMMUNITY): Payer: Medicare Other | Admitting: Certified Registered Nurse Anesthetist

## 2015-06-01 ENCOUNTER — Inpatient Hospital Stay (HOSPITAL_COMMUNITY)
Admission: RE | Admit: 2015-06-01 | Discharge: 2015-06-05 | DRG: 460 | Disposition: A | Discharge status: Home Health | Payer: Medicare Other | Source: Ambulatory Visit | Attending: Neurosurgery | Admitting: Neurosurgery

## 2015-06-01 ENCOUNTER — Encounter (HOSPITAL_COMMUNITY)
Admission: RE | Disposition: A | Discharge status: Home Health | Payer: Medicare Other | Source: Ambulatory Visit | Attending: Neurosurgery

## 2015-06-01 ENCOUNTER — Inpatient Hospital Stay (HOSPITAL_COMMUNITY): Payer: Medicare Other

## 2015-06-01 DIAGNOSIS — Z87891 Personal history of nicotine dependence: Secondary | ICD-10-CM

## 2015-06-01 DIAGNOSIS — Z419 Encounter for procedure for purposes other than remedying health state, unspecified: Secondary | ICD-10-CM

## 2015-06-01 DIAGNOSIS — F418 Other specified anxiety disorders: Secondary | ICD-10-CM | POA: Diagnosis not present

## 2015-06-01 DIAGNOSIS — M4316 Spondylolisthesis, lumbar region: Secondary | ICD-10-CM | POA: Diagnosis not present

## 2015-06-01 DIAGNOSIS — M5416 Radiculopathy, lumbar region: Secondary | ICD-10-CM | POA: Diagnosis not present

## 2015-06-01 DIAGNOSIS — M5136 Other intervertebral disc degeneration, lumbar region: Secondary | ICD-10-CM | POA: Diagnosis not present

## 2015-06-01 DIAGNOSIS — M4806 Spinal stenosis, lumbar region: Secondary | ICD-10-CM | POA: Diagnosis not present

## 2015-06-01 DIAGNOSIS — Z9889 Other specified postprocedural states: Secondary | ICD-10-CM | POA: Diagnosis not present

## 2015-06-01 SURGERY — POSTERIOR LUMBAR FUSION 2 LEVEL
Anesthesia: General | Site: Spine Lumbar

## 2015-06-01 MED ORDER — CEFAZOLIN SODIUM-DEXTROSE 2-3 GM-% IV SOLR
2.0000 g | Freq: Three times a day (TID) | INTRAVENOUS | Status: AC
  Administered 2015-06-01 – 2015-06-03 (×6): 2 g via INTRAVENOUS
  Filled 2015-06-01 (×7): qty 50

## 2015-06-01 MED ORDER — VANCOMYCIN HCL 1000 MG IV SOLR
INTRAVENOUS | Status: DC | PRN
  Administered 2015-06-01: 1000 mg via TOPICAL

## 2015-06-01 MED ORDER — SUCCINYLCHOLINE CHLORIDE 20 MG/ML IJ SOLN
INTRAMUSCULAR | Status: AC
  Filled 2015-06-01: qty 1

## 2015-06-01 MED ORDER — FENTANYL CITRATE (PF) 100 MCG/2ML IJ SOLN
INTRAMUSCULAR | Status: AC
  Filled 2015-06-01: qty 2

## 2015-06-01 MED ORDER — HYDROMORPHONE HCL 1 MG/ML IJ SOLN
0.5000 mg | INTRAMUSCULAR | Status: DC | PRN
  Administered 2015-06-01: 0.5 mg via INTRAVENOUS

## 2015-06-01 MED ORDER — SODIUM CHLORIDE 0.9 % IV SOLN
10000.0000 ug | INTRAVENOUS | Status: DC | PRN
  Administered 2015-06-01 (×4): 80 ug via INTRAVENOUS

## 2015-06-01 MED ORDER — PHENOL 1.4 % MT LIQD: 1.0000 | OROMUCOSAL | Status: DC | PRN

## 2015-06-01 MED ORDER — HYDROMORPHONE HCL 1 MG/ML IJ SOLN
INTRAMUSCULAR | Status: AC
  Filled 2015-06-01: qty 1

## 2015-06-01 MED ORDER — CARBOXYMETHYLCELLULOSE SODIUM 0.5 % OP SOLN: 1.0000 [drp] | OPHTHALMIC | Status: DC | PRN

## 2015-06-01 MED ORDER — SODIUM CHLORIDE 0.9 % IR SOLN
Status: DC | PRN
  Administered 2015-06-01: 500 mL

## 2015-06-01 MED ORDER — ONDANSETRON HCL 4 MG/2ML IJ SOLN
4.0000 mg | Freq: Once | INTRAMUSCULAR | Status: AC | PRN
  Administered 2015-06-01: 4 mg via INTRAVENOUS

## 2015-06-01 MED ORDER — ACETAMINOPHEN 650 MG RE SUPP: 650.0000 mg | RECTAL | Status: DC | PRN

## 2015-06-01 MED ORDER — OXYCODONE-ACETAMINOPHEN 5-325 MG PO TABS
1.0000 | ORAL_TABLET | ORAL | Status: DC | PRN
  Administered 2015-06-01 – 2015-06-05 (×18): 2 via ORAL
  Filled 2015-06-01 (×9): qty 2
  Filled 2015-06-01: qty 1
  Filled 2015-06-01 (×5): qty 2
  Filled 2015-06-01: qty 1
  Filled 2015-06-01 (×3): qty 2

## 2015-06-01 MED ORDER — MIDAZOLAM HCL 2 MG/2ML IJ SOLN
INTRAMUSCULAR | Status: AC
  Filled 2015-06-01: qty 2

## 2015-06-01 MED ORDER — TRIAMCINOLONE ACETONIDE 55 MCG/ACT NA AERO
2.0000 | INHALATION_SPRAY | Freq: Every day | NASAL | Status: DC | PRN
  Filled 2015-06-01: qty 21.6

## 2015-06-01 MED ORDER — ACETAMINOPHEN 325 MG PO TABS
650.0000 mg | ORAL_TABLET | Freq: Once | ORAL | Status: AC
  Administered 2015-06-01: 650 mg via ORAL
  Filled 2015-06-01 (×2): qty 2

## 2015-06-01 MED ORDER — PHENYLEPHRINE HCL 10 MG/ML IJ SOLN
10.0000 mg | INTRAMUSCULAR | Status: DC | PRN
  Administered 2015-06-01: 25 ug/min via INTRAVENOUS

## 2015-06-01 MED ORDER — 0.9 % SODIUM CHLORIDE (POUR BTL) OPTIME
TOPICAL | Status: DC | PRN
  Administered 2015-06-01: 1000 mL

## 2015-06-01 MED ORDER — SERTRALINE HCL 50 MG PO TABS
50.0000 mg | ORAL_TABLET | ORAL | Status: DC
  Administered 2015-06-01 – 2015-06-05 (×3): 50 mg via ORAL
  Filled 2015-06-01 (×3): qty 1

## 2015-06-01 MED ORDER — LIDOCAINE HCL (CARDIAC) 20 MG/ML IV SOLN
INTRAVENOUS | Status: AC
  Filled 2015-06-01: qty 5

## 2015-06-01 MED ORDER — NEOSTIGMINE METHYLSULFATE 10 MG/10ML IV SOLN
INTRAVENOUS | Status: AC
  Filled 2015-06-01: qty 1

## 2015-06-01 MED ORDER — LIDOCAINE-EPINEPHRINE 1 %-1:100000 IJ SOLN
INTRAMUSCULAR | Status: DC | PRN
  Administered 2015-06-01: 10 mL

## 2015-06-01 MED ORDER — ONDANSETRON HCL 4 MG/2ML IJ SOLN: 4.0000 mg | INTRAMUSCULAR | Status: DC | PRN

## 2015-06-01 MED ORDER — ROCURONIUM BROMIDE 50 MG/5ML IV SOLN
INTRAVENOUS | Status: AC
  Filled 2015-06-01: qty 1

## 2015-06-01 MED ORDER — FENTANYL CITRATE (PF) 100 MCG/2ML IJ SOLN
INTRAMUSCULAR | Status: DC | PRN
  Administered 2015-06-01 (×7): 50 ug via INTRAVENOUS

## 2015-06-01 MED ORDER — LIDOCAINE HCL (CARDIAC) 20 MG/ML IV SOLN
INTRAVENOUS | Status: DC | PRN
  Administered 2015-06-01: 60 mg via INTRAVENOUS

## 2015-06-01 MED ORDER — SODIUM CHLORIDE 0.9 % IJ SOLN: 3.0000 mL | INTRAMUSCULAR | Status: DC | PRN

## 2015-06-01 MED ORDER — ARTIFICIAL TEARS OP OINT
TOPICAL_OINTMENT | OPHTHALMIC | Status: DC | PRN
  Administered 2015-06-01: 1 via OPHTHALMIC

## 2015-06-01 MED ORDER — EPHEDRINE SULFATE 50 MG/ML IJ SOLN
INTRAMUSCULAR | Status: AC
  Filled 2015-06-01: qty 1

## 2015-06-01 MED ORDER — SUGAMMADEX SODIUM 200 MG/2ML IV SOLN
INTRAVENOUS | Status: AC
  Filled 2015-06-01: qty 2

## 2015-06-01 MED ORDER — ACETAMINOPHEN-CODEINE #3 300-30 MG PO TABS: 1.0000 | ORAL_TABLET | ORAL | Status: DC | PRN

## 2015-06-01 MED ORDER — ONDANSETRON HCL 4 MG/2ML IJ SOLN
INTRAMUSCULAR | Status: AC
  Filled 2015-06-01: qty 2

## 2015-06-01 MED ORDER — SODIUM CHLORIDE 0.9 % IJ SOLN
3.0000 mL | Freq: Two times a day (BID) | INTRAMUSCULAR | Status: DC
  Administered 2015-06-02 – 2015-06-05 (×5): 3 mL via INTRAVENOUS

## 2015-06-01 MED ORDER — GLYCOPYRROLATE 0.2 MG/ML IJ SOLN
INTRAMUSCULAR | Status: AC
  Filled 2015-06-01: qty 2

## 2015-06-01 MED ORDER — PROPOFOL 10 MG/ML IV BOLUS
INTRAVENOUS | Status: AC
  Filled 2015-06-01: qty 20

## 2015-06-01 MED ORDER — SODIUM CHLORIDE 0.9 % IJ SOLN
INTRAMUSCULAR | Status: AC
  Filled 2015-06-01: qty 10

## 2015-06-01 MED ORDER — FENTANYL CITRATE (PF) 250 MCG/5ML IJ SOLN
INTRAMUSCULAR | Status: AC
  Filled 2015-06-01: qty 5

## 2015-06-01 MED ORDER — SURGIFOAM 100 EX MISC
CUTANEOUS | Status: DC | PRN
  Administered 2015-06-01: 20 mL via TOPICAL

## 2015-06-01 MED ORDER — DEXAMETHASONE SODIUM PHOSPHATE 10 MG/ML IJ SOLN
10.0000 mg | INTRAMUSCULAR | Status: DC
  Filled 2015-06-01: qty 1

## 2015-06-01 MED ORDER — SODIUM CHLORIDE 0.9 % IV SOLN: 250.0000 mL | INTRAVENOUS | Status: DC

## 2015-06-01 MED ORDER — ACETAMINOPHEN 325 MG PO TABS: 650.0000 mg | ORAL_TABLET | ORAL | Status: DC | PRN

## 2015-06-01 MED ORDER — LACTATED RINGERS IV SOLN
INTRAVENOUS | Status: DC
  Administered 2015-06-01 (×2): via INTRAVENOUS

## 2015-06-01 MED ORDER — MELOXICAM 15 MG PO TABS
15.0000 mg | ORAL_TABLET | Freq: Every day | ORAL | Status: DC
  Administered 2015-06-01 – 2015-06-05 (×5): 15 mg via ORAL
  Filled 2015-06-01: qty 2
  Filled 2015-06-01: qty 1
  Filled 2015-06-01: qty 2
  Filled 2015-06-01: qty 1
  Filled 2015-06-01: qty 2
  Filled 2015-06-01: qty 1
  Filled 2015-06-01: qty 2
  Filled 2015-06-01 (×2): qty 1
  Filled 2015-06-01: qty 2

## 2015-06-01 MED ORDER — VANCOMYCIN HCL 1000 MG IV SOLR
INTRAVENOUS | Status: AC
  Filled 2015-06-01: qty 1000

## 2015-06-01 MED ORDER — DOCUSATE SODIUM 100 MG PO CAPS
100.0000 mg | ORAL_CAPSULE | Freq: Two times a day (BID) | ORAL | Status: DC
Start: 2015-06-01 — End: 2015-06-05
  Administered 2015-06-01 – 2015-06-05 (×8): 100 mg via ORAL
  Filled 2015-06-01 (×8): qty 1

## 2015-06-01 MED ORDER — POLYVINYL ALCOHOL 1.4 % OP SOLN
1.0000 [drp] | OPHTHALMIC | Status: DC | PRN
  Filled 2015-06-01: qty 15

## 2015-06-01 MED ORDER — SUGAMMADEX SODIUM 200 MG/2ML IV SOLN
INTRAVENOUS | Status: DC | PRN
  Administered 2015-06-01: 150 mg via INTRAVENOUS

## 2015-06-01 MED ORDER — PHENYLEPHRINE 40 MCG/ML (10ML) SYRINGE FOR IV PUSH (FOR BLOOD PRESSURE SUPPORT)
PREFILLED_SYRINGE | INTRAVENOUS | Status: AC
  Filled 2015-06-01: qty 10

## 2015-06-01 MED ORDER — ARTIFICIAL TEARS OP OINT
TOPICAL_OINTMENT | OPHTHALMIC | Status: AC
  Filled 2015-06-01: qty 3.5

## 2015-06-01 MED ORDER — PROPOFOL 10 MG/ML IV BOLUS
INTRAVENOUS | Status: DC | PRN
  Administered 2015-06-01: 130 mg via INTRAVENOUS

## 2015-06-01 MED ORDER — CYCLOBENZAPRINE HCL 10 MG PO TABS
10.0000 mg | ORAL_TABLET | Freq: Three times a day (TID) | ORAL | Status: DC | PRN
  Administered 2015-06-01 – 2015-06-05 (×5): 10 mg via ORAL
  Filled 2015-06-01 (×5): qty 1

## 2015-06-01 MED ORDER — ROCURONIUM BROMIDE 100 MG/10ML IV SOLN
INTRAVENOUS | Status: DC | PRN
  Administered 2015-06-01: 40 mg via INTRAVENOUS
  Administered 2015-06-01 (×3): 10 mg via INTRAVENOUS

## 2015-06-01 MED ORDER — FENTANYL CITRATE (PF) 100 MCG/2ML IJ SOLN
25.0000 ug | INTRAMUSCULAR | Status: DC | PRN
  Administered 2015-06-01: 50 ug via INTRAVENOUS

## 2015-06-01 MED ORDER — CEFAZOLIN SODIUM-DEXTROSE 2-3 GM-% IV SOLR
2.0000 g | INTRAVENOUS | Status: AC
  Administered 2015-06-01: 2 g via INTRAVENOUS
  Filled 2015-06-01: qty 50

## 2015-06-01 MED ORDER — BUPIVACAINE LIPOSOME 1.3 % IJ SUSP
INTRAMUSCULAR | Status: DC | PRN
  Administered 2015-06-01: 20 mL

## 2015-06-01 MED ORDER — MENTHOL 3 MG MT LOZG: 1.0000 | LOZENGE | OROMUCOSAL | Status: DC | PRN

## 2015-06-01 MED ORDER — MIDAZOLAM HCL 5 MG/5ML IJ SOLN
INTRAMUSCULAR | Status: DC | PRN
  Administered 2015-06-01: 2 mg via INTRAVENOUS

## 2015-06-01 MED ORDER — BUPIVACAINE LIPOSOME 1.3 % IJ SUSP
20.0000 mL | Freq: Once | INTRAMUSCULAR | Status: DC
  Filled 2015-06-01: qty 20

## 2015-06-01 SURGICAL SUPPLY — 81 items
ALLOSTEM STRIP 20MMX50MM (Tissue) ×1 IMPLANT
APL SKNCLS STERI-STRIP NONHPOA (GAUZE/BANDAGES/DRESSINGS) ×1
BAG DECANTER FOR FLEXI CONT (MISCELLANEOUS) ×2 IMPLANT
BENZOIN TINCTURE PRP APPL 2/3 (GAUZE/BANDAGES/DRESSINGS) ×2 IMPLANT
BIT DRILL 5.0/4.0 (BIT) IMPLANT
BLADE CLIPPER SURG (BLADE) IMPLANT
BLADE SURG 11 STRL SS (BLADE) ×2 IMPLANT
BONE ALLOSTEM MORSELIZED 5CC (Bone Implant) ×1 IMPLANT
BRUSH SCRUB EZ PLAIN DRY (MISCELLANEOUS) ×2 IMPLANT
BUR MATCHSTICK NEURO 3.0 LAGG (BURR) ×2 IMPLANT
BUR PRECISION FLUTE 6.0 (BURR) ×2 IMPLANT
CANISTER SUCT 3000ML PPV (MISCELLANEOUS) ×2 IMPLANT
CAP LOCKING (Cap) ×6 IMPLANT
CAP LOCKING 5.5 CREO (Cap) IMPLANT
CONT SPEC 4OZ CLIKSEAL STRL BL (MISCELLANEOUS) ×2 IMPLANT
COVER BACK TABLE 60X90IN (DRAPES) ×2 IMPLANT
DECANTER SPIKE VIAL GLASS SM (MISCELLANEOUS) ×2 IMPLANT
DRAPE C-ARM 42X72 X-RAY (DRAPES) ×2 IMPLANT
DRAPE C-ARMOR (DRAPES) ×2 IMPLANT
DRAPE LAPAROTOMY 100X72X124 (DRAPES) ×2 IMPLANT
DRAPE POUCH INSTRU U-SHP 10X18 (DRAPES) ×2 IMPLANT
DRAPE PROXIMA HALF (DRAPES) ×1 IMPLANT
DRAPE SURG 17X23 STRL (DRAPES) ×2 IMPLANT
DRILL 5.0/4.0 (BIT) ×2
DRSG OPSITE 4X5.5 SM (GAUZE/BANDAGES/DRESSINGS) ×1 IMPLANT
DRSG OPSITE POSTOP 4X6 (GAUZE/BANDAGES/DRESSINGS) ×1 IMPLANT
DURAPREP 26ML APPLICATOR (WOUND CARE) ×2 IMPLANT
ELECT REM PT RETURN 9FT ADLT (ELECTROSURGICAL) ×2
ELECTRODE REM PT RTRN 9FT ADLT (ELECTROSURGICAL) ×1 IMPLANT
EVACUATOR 3/16  PVC DRAIN (DRAIN) ×1
EVACUATOR 3/16 PVC DRAIN (DRAIN) ×1 IMPLANT
GAUZE SPONGE 4X4 12PLY STRL (GAUZE/BANDAGES/DRESSINGS) ×2 IMPLANT
GAUZE SPONGE 4X4 16PLY XRAY LF (GAUZE/BANDAGES/DRESSINGS) ×1 IMPLANT
GLOVE BIO SURGEON STRL SZ7 (GLOVE) ×1 IMPLANT
GLOVE BIO SURGEON STRL SZ8 (GLOVE) ×4 IMPLANT
GLOVE BIOGEL PI IND STRL 7.0 (GLOVE) IMPLANT
GLOVE BIOGEL PI IND STRL 7.5 (GLOVE) IMPLANT
GLOVE BIOGEL PI INDICATOR 7.0 (GLOVE) ×5
GLOVE BIOGEL PI INDICATOR 7.5 (GLOVE) ×1
GLOVE EXAM NITRILE LRG STRL (GLOVE) IMPLANT
GLOVE EXAM NITRILE MD LF STRL (GLOVE) IMPLANT
GLOVE EXAM NITRILE XL STR (GLOVE) IMPLANT
GLOVE EXAM NITRILE XS STR PU (GLOVE) IMPLANT
GLOVE INDICATOR 7.5 STRL GRN (GLOVE) ×1 IMPLANT
GLOVE INDICATOR 8.5 STRL (GLOVE) ×4 IMPLANT
GOWN STRL REUS W/ TWL LRG LVL3 (GOWN DISPOSABLE) IMPLANT
GOWN STRL REUS W/ TWL XL LVL3 (GOWN DISPOSABLE) ×2 IMPLANT
GOWN STRL REUS W/TWL 2XL LVL3 (GOWN DISPOSABLE) IMPLANT
GOWN STRL REUS W/TWL LRG LVL3 (GOWN DISPOSABLE) ×2
GOWN STRL REUS W/TWL XL LVL3 (GOWN DISPOSABLE) ×4
KIT BASIN OR (CUSTOM PROCEDURE TRAY) ×2 IMPLANT
KIT INFUSE XX SMALL 0.7CC (Orthopedic Implant) ×1 IMPLANT
KIT ROOM TURNOVER OR (KITS) ×2 IMPLANT
LIQUID BAND (GAUZE/BANDAGES/DRESSINGS) ×2 IMPLANT
NDL HYPO 21X1.5 SAFETY (NEEDLE) ×1 IMPLANT
NDL HYPO 25X1 1.5 SAFETY (NEEDLE) ×1 IMPLANT
NEEDLE HYPO 21X1.5 SAFETY (NEEDLE) ×2 IMPLANT
NEEDLE HYPO 25X1 1.5 SAFETY (NEEDLE) ×2 IMPLANT
NS IRRIG 1000ML POUR BTL (IV SOLUTION) ×2 IMPLANT
PACK LAMINECTOMY NEURO (CUSTOM PROCEDURE TRAY) ×2 IMPLANT
PAD ARMBOARD 7.5X6 YLW CONV (MISCELLANEOUS) ×6 IMPLANT
ROD 65MM SPINAL (Rod) ×1 IMPLANT
ROD 70MM SPINAL (Rod) ×1 IMPLANT
ROD SPNL 5.5 CREO TI 65 (Rod) IMPLANT
SCREW CORT CREO 6.0-5.0X30MM (Screw) ×2 IMPLANT
SCREW MOD 6.0-5.0X35MM (Screw) ×2 IMPLANT
SCREW PREASSEMBLY 6.0-5.0X35 (Screw) ×2 IMPLANT
SPACER RISE 10X22 8-14MM-10 (Spacer) ×4 IMPLANT
SPONGE LAP 4X18 X RAY DECT (DISPOSABLE) IMPLANT
SPONGE SURGIFOAM ABS GEL 100 (HEMOSTASIS) ×2 IMPLANT
STRIP CLOSURE SKIN 1/2X4 (GAUZE/BANDAGES/DRESSINGS) ×3 IMPLANT
SUT VIC AB 0 CT1 18XCR BRD8 (SUTURE) ×1 IMPLANT
SUT VIC AB 0 CT1 8-18 (SUTURE) ×4
SUT VIC AB 2-0 CT1 18 (SUTURE) ×3 IMPLANT
SUT VIC AB 4-0 PS2 27 (SUTURE) ×2 IMPLANT
SYR 20CC LL (SYRINGE) ×3 IMPLANT
TOWEL OR 17X24 6PK STRL BLUE (TOWEL DISPOSABLE) ×2 IMPLANT
TOWEL OR 17X26 10 PK STRL BLUE (TOWEL DISPOSABLE) ×2 IMPLANT
TRAY FOLEY W/METER SILVER 14FR (SET/KITS/TRAYS/PACK) ×2 IMPLANT
TULIP CREP AMP 5.5MM (Orthopedic Implant) ×2 IMPLANT
WATER STERILE IRR 1000ML POUR (IV SOLUTION) ×2 IMPLANT

## 2015-06-01 NOTE — Progress Notes (Signed)
Utilization review completed.  

## 2015-06-01 NOTE — Transfer of Care (Signed)
Immediate Anesthesia Transfer of Care Note  Patient: Judith Chavez  Procedure(s) Performed: Procedure(s): POSTERIOR LUMBAR INTERBODY FUSION LUMBAR THREE-FOUR,LUMBAR FOUR-FIVE (N/A)  Patient Location: PACU  Anesthesia Type:General  Level of Consciousness: awake and pateint uncooperative  Airway & Oxygen Therapy: Patient Spontanous Breathing and Patient connected to nasal cannula oxygen  Post-op Assessment: Report given to RN, Post -op Vital signs reviewed and stable, Patient moving all extremities, Patient moving all extremities X 4 and Patient able to stick tongue midline  Post vital signs: Reviewed and stable  Last Vitals:  Filed Vitals:   06/01/15 0639 06/01/15 1300  BP:  109/82  Temp: 36.1 C 36.3 C  Resp:      Complications: No apparent anesthesia complications

## 2015-06-01 NOTE — Anesthesia Preprocedure Evaluation (Addendum)
Anesthesia Evaluation  Patient identified by MRN, date of birth, ID band Patient awake    Reviewed: Allergy & Precautions, NPO status , Patient's Chart, lab work & pertinent test results  History of Anesthesia Complications Negative for: history of anesthetic complications  Airway Mallampati: III  TM Distance: >3 FB Neck ROM: Full    Dental no notable dental hx. (+) Dental Advisory Given   Pulmonary former smoker,    Pulmonary exam normal breath sounds clear to auscultation       Cardiovascular negative cardio ROS Normal cardiovascular exam Rhythm:Regular Rate:Normal     Neuro/Psych  Headaches, PSYCHIATRIC DISORDERS Anxiety Depression    GI/Hepatic negative GI ROS, Neg liver ROS,   Endo/Other  negative endocrine ROS  Renal/GU negative Renal ROS  negative genitourinary   Musculoskeletal  (+) Arthritis , Pain in R leg mostly, also down L with associated numbness and weakness. Pain across lower back.    Abdominal   Peds negative pediatric ROS (+)  Hematology negative hematology ROS (+)   Anesthesia Other Findings   Reproductive/Obstetrics negative OB ROS                            Anesthesia Physical Anesthesia Plan  ASA: II  Anesthesia Plan: General   Post-op Pain Management:    Induction: Intravenous  Airway Management Planned: Oral ETT  Additional Equipment:   Intra-op Plan:   Post-operative Plan: Extubation in OR  Informed Consent: I have reviewed the patients History and Physical, chart, labs and discussed the procedure including the risks, benefits and alternatives for the proposed anesthesia with the patient or authorized representative who has indicated his/her understanding and acceptance.   Dental advisory given  Plan Discussed with: CRNA  Anesthesia Plan Comments:         Anesthesia Quick Evaluation

## 2015-06-01 NOTE — Anesthesia Procedure Notes (Signed)
Procedure Name: Intubation Date/Time: 06/01/2015 8:57 AM Performed by: Shirlyn Goltz Pre-anesthesia Checklist: Patient identified, Emergency Drugs available, Suction available and Patient being monitored Patient Re-evaluated:Patient Re-evaluated prior to inductionOxygen Delivery Method: Circle system utilized Preoxygenation: Pre-oxygenation with 100% oxygen Intubation Type: IV induction Ventilation: Mask ventilation without difficulty Laryngoscope Size: Mac and 3 Grade View: Grade II Tube type: Oral Tube size: 7.0 mm Number of attempts: 1 Airway Equipment and Method: Stylet Placement Confirmation: ETT inserted through vocal cords under direct vision,  positive ETCO2 and breath sounds checked- equal and bilateral Secured at: 22 cm Tube secured with: Tape Dental Injury: Teeth and Oropharynx as per pre-operative assessment

## 2015-06-01 NOTE — Op Note (Signed)
Preoperative diagnosis: Grade 1 spondylolisthesis and lumbar spinal stenosis L3-4, degenerative disc disease severe lumbar spinal stenosis with radiculopathy L4-5  Postoperative diagnosis: Same  Procedure: #1 decompressive lumbar laminectomies in excess and requiring more work to would be needed with a standard interbody fusion at L3-4 and L4-5  #2 posterior lumbar interbody fusion L3-4 L4-5 using the globus rise expandable cage system packed with locally harvested autograft mixed with allostem morsels and strips  #3 cortical screw fixation L3-L5 using the globus creo MCS cortical screw set  Surgeon: Dominica Severin Yarissa Reining  Asst.: Marland Kitchen Ditty  Anesthesia: Gen.  EBL: Minimal  History of present illness: Patient is a very pleasant 69 year old female is a progress worsening back and bilateral leg pain mostly in an L4 and L5 nerve root pattern. Workup revealed severe spinal stenosis grade 1 spinal listhesis and instability L3-4. As well as severe spinal stenosis at L4-5. And due to her progressive worsening clinical syndrome failure conservative treatment imaging findings I recommended decompression stabilization procedures at L3-4 and L4-5. I extensively went over the risks and benefits of the operation with her as well as perioperative course expectations of outcome and alternatives of surgery and she understands and agrees to proceed forward.  Operative procedure: Patient brought into the or was induced under general anesthesia positioned prone on Jackson table back was prepped and draped in routine sterile fashion preoperative x-ray localize the appropriate level so after infiltration of 10 mL lidocaine with epi a midline incision was made and Bovie left car was used to gas in tissues and subperiosteal dissections care lamina of L3-4 and L4-5. I also exposed the entry point inferiorly to C3 facet. Then using fluoroscopy and an AP and lateral projections I selected 200 point of the 5 and 7:00 positions of  the L3 pedicles drilled those probed him tapped with a 6050 tap probed again and 6050 by 35 mm screws were inserted. All screws excellent purchase. At this point the spinous processes at L3 and L4 sent decompression was begun there was marked hourglass compression of thecal sac predominantly at L3-4 but also at L4-5 complete medial fasciectomies were performed with radical foraminotomies of the L3-L4 and L5 nerve roots. After adequate and aggressive undergoing the superior tickling facets at both levels and axis the lateral margins of this was identified and obtained this basal cleaned out and using sequential distraction cleaned out the disc and prepare the endplates and utilizing 8-14 expandable cages at L3-4 as well as 814 expandable to L4-5 I opened up the disc space packed and aggressive local autograft mixed centrally along with BMP and by opening up the disc space I spoke moderately reduced the deformity L3-4 opening up the L3 foramen. After all the cage been inserted autograft were then packed within the cage as well as centrally, after all the interbody interbody work been done and this was all confirmed by fluoroscopy attention second the cortical screw placement at the L4 and L5 pedicles so directing slightly lateral straighten the pedicle with fluoroscopy I inserted 60 by 50 30 Milner screws at L4 and 60/50 by 35 mm at L5. Then copiously irrigated wound aggressively decorticated the superior aspect of the facet complexes but some more bone graft from the tops of each facet to each other as well as the [place] strips. I assembled the heads at L3 and 2 rods anchored the top tightening nuts explore the foramina to confirm patency laid Gelfoam over the foramen large Hemovac drain and closed with layers with after  Vicryl and a running 4 subcuticular and skin Dermabond benzo and Steri-Strips and sterile dressing applied patient recovered in stable condition. At the end of case all needle counts sponge counts  were correct.

## 2015-06-01 NOTE — H&P (Signed)
Judith Chavez is an 69 y.o. female.   Chief Complaint: Back and right greater than left leg pain HPI: Patient is a very pleasant 69 year old female is a progress worsening back and predominantly right leg pain rating down hip posterior lateral thigh the front of her shin in the top foot. This was consistent with an L4 and L5 nerve root pattern. Workup revealed severe spondylosis stenosis and spondylolisthesis at L3-4 and L4-5. Due to her failure conservative treatment imaging findings progression of clinical syndrome I recommended decompression and fusion at L3-4 and L4-5. I extensively reviewed the risks and benefits of the operation with the patient as well as perioperative course expectations of outcome and alternatives of surgery and she understands and agrees to proceed forward.  Past Medical History  Diagnosis Date  . History of pneumonia   . Anxiety   . Depression   . Headache     migraines  . Arthritis     knees  . Numbness     "left foot"     Past Surgical History  Procedure Laterality Date  . Cesarean section  1970  . Abcess drainage      "after cesarean- in hospital for 3 months"  . Abdominal hysterectomy    . Cholecystectomy    . Eye surgery Right   . Colonoscopy    . Meniscus repair Bilateral     History reviewed. No pertinent family history. Social History:  reports that she quit smoking about 28 years ago. She does not have any smokeless tobacco history on file. She reports that she drinks alcohol. She reports that she does not use illicit drugs.  Allergies: No Known Allergies  Medications Prior to Admission  Medication Sig Dispense Refill  . acetaminophen-codeine (TYLENOL #3) 300-30 MG tablet Take 1 tablet by mouth every 4 (four) hours as needed for moderate pain.    . Carboxymethylcellulose Sodium (REFRESH TEARS OP) Place 1 drop into the right eye as needed (for dry eyes).    . meloxicam (MOBIC) 15 MG tablet Take 15 mg by mouth daily as needed for pain.    Marland Kitchen  sertraline (ZOLOFT) 50 MG tablet Take 50 mg by mouth every other day.    . Triamcinolone Acetonide (NASACORT ALLERGY 24HR NA) Place 1 spray into both nostrils daily as needed (for allergies).      No results found for this or any previous visit (from the past 48 hour(s)). No results found.  Review of Systems  Constitutional: Negative.   HENT: Negative.   Eyes: Negative.   Respiratory: Negative.   Cardiovascular: Negative.   Gastrointestinal: Negative.   Genitourinary: Negative.   Musculoskeletal: Positive for myalgias and back pain.  Skin: Negative.   Neurological: Positive for tingling and sensory change.  Psychiatric/Behavioral: Negative.     Blood pressure 144/93, temperature 97 F (36.1 C), temperature source Oral, resp. rate 20, height 5\' 2"  (1.575 m), weight 70.761 kg (156 lb), SpO2 100 %. Physical Exam  Constitutional: She is oriented to person, place, and time. She appears well-developed.  HENT:  Head: Normocephalic.  Neck: Normal range of motion.  Respiratory: Effort normal.  GI: Soft.  Neurological: She is alert and oriented to person, place, and time. She has normal strength. GCS eye subscore is 4. GCS verbal subscore is 5. GCS motor subscore is 6.  Strength is 5 out of 5 in her iliopsoas, quads, hip she's, gastrocs, and tibialis, and EHL.  Skin: Skin is warm and dry.     Assessment/Plan 69 year old  female presents for an L3-4 L4-5 decompression fusion.  Opel Lejeune P 06/01/2015, 8:38 AM

## 2015-06-02 MED FILL — Sodium Chloride IV Soln 0.9%: INTRAVENOUS | Qty: 1000 | Status: AC

## 2015-06-02 MED FILL — Heparin Sodium (Porcine) Inj 1000 Unit/ML: INTRAMUSCULAR | Qty: 30 | Status: AC

## 2015-06-02 NOTE — Anesthesia Postprocedure Evaluation (Signed)
Anesthesia Post Note  Patient: Judith Chavez  Procedure(s) Performed: Procedure(s) (LRB): POSTERIOR LUMBAR INTERBODY FUSION LUMBAR THREE-FOUR,LUMBAR FOUR-FIVE (N/A)  Patient location during evaluation: PACU Anesthesia Type: General Level of consciousness: awake and alert Pain management: pain level controlled Vital Signs Assessment: post-procedure vital signs reviewed and stable Respiratory status: spontaneous breathing, nonlabored ventilation, respiratory function stable and patient connected to nasal cannula oxygen Cardiovascular status: blood pressure returned to baseline and stable Postop Assessment: No signs of nausea or vomiting Anesthetic complications: no    Last Vitals:  Filed Vitals:   06/02/15 0935 06/02/15 1412  BP: 117/78 121/60  Pulse: 70 71  Temp: 36.7 C 36.8 C  Resp: 16 16    Last Pain:  Filed Vitals:   06/02/15 1412  PainSc: 6     LLE Motor Response: Purposeful movement LLE Sensation: Full sensation RLE Motor Response: Purposeful movement RLE Sensation: Full sensation      Mairi Stagliano [name]

## 2015-06-02 NOTE — Progress Notes (Signed)
Subjective: Patient reports Doing great no leg pain condition of back soreness but managed  Objective: Vital signs in last 24 hours: Temp:  [97 F (36.1 C)-98.3 F (36.8 C)] 98.3 F (36.8 C) (11/22 0208) Pulse Rate:  [75-109] 79 (11/22 0208) Resp:  [8-42] 17 (11/22 0208) BP: (109-154)/(57-96) 115/57 mmHg (11/22 0208) SpO2:  [80 %-98 %] 96 % (11/22 0208)  Intake/Output from previous day: 11/21 0701 - 11/22 0700 In: 1800 [I.V.:1800] Out: 1710 [Urine:1235; Drains:175; Blood:300] Intake/Output this shift: Total I/O In: -  Out: 700 [Urine:550; Drains:150]  Strength 5 out of 5 wound clean dry and intact  Lab Results: No results for input(s): WBC, HGB, HCT, PLT in the last 72 hours. BMET No results for input(s): NA, K, CL, CO2, GLUCOSE, BUN, CREATININE, CALCIUM in the last 72 hours.  Studies/Results: Dg Lumbar Spine 2-3 Views  06/01/2015  CLINICAL DATA:  Lumbar spine surgery. EXAM: LUMBAR SPINE - 2-3 VIEW COMPARISON:  MRI 07/13/2014 . FINDINGS: Lumbar spine numbered as per prior MRI of 07/13/2014 . Postsurgical changes are noted from L3 through L5. Inter disc fusion L3- L4, L4-L5. IMPRESSION: Postsurgical changes lumbar spine. Electronically Signed   By: Marcello Moores  Register   On: 06/01/2015 12:44   Dg C-arm Gt 120 Min  06/01/2015  CLINICAL DATA:  Lumbar spine surgery. EXAM: DG C-ARM GT 120 MIN CONTRAST:  None. FLUOROSCOPY TIME:  Fluoroscopy Time (in minutes and seconds): 0 minutes, 0 seconds fluoroscopy time reported Number of Acquired Images:  2 images COMPARISON:  MRI of 07/13/2014. FINDINGS: Lumbar vertebra numbered as per prior MRI of 07/13/2014 . L3-L4 and L4-L5 postsurgical changes are present. Inter disc fusion device is noted L3-L4 and L4-L5. Good anatomic alignment. IMPRESSION: Postsurgical changes L3 through L5. Good anatomic alignment. No acute abnormality. Electronically Signed   By: Marcello Moores  Register   On: 06/01/2015 12:50    Assessment/Plan: Continue to mobilize today with  physical and occupational therapy. Continue Hemovac. DC Foley.  LOS: 1 day     Sherrine Salberg P 06/02/2015, 6:38 AM

## 2015-06-02 NOTE — Progress Notes (Signed)
Occupational Therapy Evaluation Patient Details Name: Judith Chavez MRN: [id number] DOB: [birth year] Today's Date: 06/02/2015    History of Present Illness Adm 06/01/15 for L3-5 laminectomy and fusion  PMHx- anxiety, depression, Lt foot/leg numbness   Clinical Impression   PTA, pt was independent with all ADL and functional mobility. Pt currently requires mod assist for ADL and mobility. Pt will benefit from continued skilled OT to increase independence with ADL and mobility. Pt benefit from education and practice with AE for LB ADL.Will continue to follow acutely.     Follow Up Recommendations  No OT follow up;Supervision/Assistance - 24 hour    Equipment Recommendations  3 in 1 bedside comode;Other (comment) (RW)    Recommendations for Other Services       Precautions / Restrictions Precautions Precautions: Back;Fall Precaution Comments: Reviewed precautions-pt able to recall 3/3 precautions at end of session Required Braces or Orthoses: Spinal Brace Spinal Brace: Lumbar corset;Applied in sitting position Restrictions Weight Bearing Restrictions: No      Mobility Bed Mobility Overal bed mobility: Needs Assistance Bed Mobility: Rolling;Sidelying to Sit;Sit to Sidelying Rolling: Min assist Sidelying to sit: Mod assist     Sit to sidelying: Mod assist General bed mobility comments: Pt required verbal cues for log roll technique and required mod assist for both LE to progress from sidelying to supine.  Transfers Overall transfer level: Needs assistance Equipment used: Rolling walker (2 wheeled) Transfers: Sit to/from Stand Sit to Stand: Mod assist         General transfer comment: Pt required mod assist from lower surfaces and min assist from elevated surfaces. Pt with increased anxiety during transfers due to fear of falling    Balance Overall balance assessment: Needs assistance Sitting-balance support: Bilateral upper extremity supported Sitting  balance-Leahy Scale: Fair     Standing balance support: Bilateral upper extremity supported Standing balance-Leahy Scale: Fair                              ADL Overall ADL's : Needs assistance/impaired     Grooming: Wash/dry hands;Wash/dry face;Oral care;Set up;Cueing for safety;Standing Grooming Details (indicate cue type and reason): Verbal cues for safe positioning of RW while at sink                 Toilet Transfer: Minimal assistance;Cueing for sequencing;Cueing for safety;Ambulation;BSC;Grab bars;RW Armed forces technical officer Details (indicate cue type and reason): Verbal cues for sequencing with RW and BSC Toileting- Clothing Manipulation and Hygiene: Min guard;Sit to/from stand       Functional mobility during ADLs: Minimal assistance;Cueing for safety;Cueing for sequencing;Rolling walker General ADL Comments: Pt moving very slowly because of fear of falling and does not want to break any precautions. Pt demonstrating excellent adherence to back precautions during transfers and ADL. Pt required verbal cues for safe hand placement and positioning of RW during functional activities. Educated and practiced donning/doffing back brace-pt would like husband to be taught this as well.      Vision Vision Assessment?: No apparent visual deficits   Perception     Praxis      Pertinent Vitals/Pain Pain Assessment: 0-10 Pain Score: 5  Pain Descriptors / Indicators: Aching;Operative site guarding Pain Intervention(s): Limited activity within patient's tolerance;Monitored during session;Repositioned     Hand Dominance Right   Extremity/Trunk Assessment Upper Extremity Assessment Upper Extremity Assessment: Overall WFL for tasks assessed   Lower Extremity Assessment Lower Extremity Assessment: Defer to PT evaluation  Cervical / Trunk Assessment Cervical / Trunk Assessment: Normal   Communication Communication Communication: No difficulties   Cognition  Arousal/Alertness: Lethargic;Suspect due to medications Behavior During Therapy: Flat affect Overall Cognitive Status: Within Functional Limits for tasks assessed                     General Comments       Exercises       Shoulder Instructions      Home Living Family/patient expects to be discharged to:: Private residence Living Arrangements: Spouse/significant other Available Help at Discharge: Family;Available 24 hours/day Type of Home: House Home Access: Stairs to enter [employer] of Steps: 1 Entrance Stairs-Rails: None Home Layout: Two level;Able to live on main level with bedroom/bathroom     Bathroom Shower/Tub: Walk-in shower;Door   [employer] Toilet: Standard     Home Equipment: Cane - single point          Prior Functioning/Environment Level of Independence: Independent             OT Diagnosis: Generalized weakness;Acute pain   OT Problem List: Decreased strength;Decreased range of motion;Decreased activity tolerance;Impaired balance (sitting and/or standing);Decreased coordination;Decreased safety awareness;Decreased knowledge of use of DME or AE;Decreased knowledge of precautions;Pain   OT Treatment/Interventions: Self-care/ADL training;DME and/or AE instruction;Therapeutic activities;Patient/family education;Balance training    OT Goals(Current goals can be found in the care plan section) Acute Rehab OT Goals Patient Stated Goal: to decrease pain OT Goal Formulation: With patient Time For Goal Achievement: 06/16/15 Potential to Achieve Goals: Good ADL Goals Pt Will Perform Lower Body Bathing: with supervision;with adaptive equipment;sit to/from stand Pt Will Perform Lower Body Dressing: with supervision;with adaptive equipment;sit to/from stand Pt Will Transfer to Toilet: with supervision;ambulating;bedside commode Pt Will Perform Toileting - Clothing Manipulation and hygiene: with supervision;sitting/lateral leans Pt Will  Perform Tub/Shower Transfer: Shower transfer;with supervision;ambulating;rolling walker Additional ADL Goal #1: Pt will verbalize 3/3 back precautions.  OT Frequency: Min 3X/week   Barriers to D/C:            Co-evaluation              End of Session Equipment Utilized During Treatment: Gait belt;Rolling walker;Back brace  Activity Tolerance: Patient tolerated treatment well;Patient limited by pain Patient left: in bed;with call bell/phone within reach;with bed alarm set   Time: [id number] OT Time Calculation (min): 27 min Charges:  OT General Charges $OT Visit: 1 Procedure OT Evaluation $Initial OT Evaluation Tier I: 1 Procedure OT Treatments $Self Care/Home Management : 8-22 mins G-Codes:    Redmond Baseman 2015-06-08, 4:04 PM

## 2015-06-02 NOTE — Evaluation (Addendum)
Physical Therapy Evaluation Patient Details Name: Judith Chavez MRN: [id number] DOB: 07/01/1946 Today's Date: 06/02/2015   History of Present Illness  Adm 06/01/15 for L3-5 laminectomy and fusion  PMHx- anxiety, depression, Lt foot/leg numbness  Clinical Impression  Patient is s/p above surgery resulting in the deficits listed below (see PT Problem List). Patient will benefit from skilled PT to increase their independence and safety with mobility (while adhering to their precautions) to allow discharge to the venue listed below.     Follow Up Recommendations Home health PT;Supervision for mobility/OOB (assuming cognition clears up)    Equipment Recommendations  Rolling walker with 5" wheels    Recommendations for Other Services OT consult     Precautions / Restrictions Precautions Precautions: Back;Fall Precaution Booklet Issued: Yes (comment) Precaution Comments: pt very groggy and unable to recall at end of session Required Braces or Orthoses: Spinal Brace Spinal Brace: Lumbar corset;Applied in sitting position Restrictions Weight Bearing Restrictions: No      Mobility  Bed Mobility Overal bed mobility: Needs Assistance Bed Mobility: Rolling;Sidelying to Sit Rolling: Mod assist Sidelying to sit: Mod assist       General bed mobility comments: drowsy required frequent repetition of instructions/steps  Transfers Overall transfer level: Needs assistance Equipment used: Rolling walker (2 wheeled) Transfers: Sit to/from Stand Sit to Stand: Mod assist;From elevated surface (simulated height of her bed)         General transfer comment: required repeated cues for sequence and safe use of RW (very drowsy)  Ambulation/Gait Ambulation/Gait assistance: Min assist Ambulation Distance (Feet): 20 Feet Assistive device: Rolling walker (2 wheeled) Gait Pattern/deviations: Step-through pattern;Decreased stride length;Shuffle Gait velocity: very slow Gait velocity  interpretation: <1.8 ft/sec, indicative of risk for recurrent falls General Gait Details: pt taking extremely short steps (fearful of falling while drowsy); distance limited due to RN trying to cap-off pt's IV while up (had to stand still for ~3 minutes) with pt uncormfortable and tired  Stairs            Wheelchair Mobility    Modified Rankin (Stroke Patients Only)       Balance                                             Pertinent Vitals/Pain Pain Assessment: 0-10 Pain Score: 4  Pain Location: back Pain Descriptors / Indicators: Operative site guarding Pain Intervention(s): Limited activity within patient's tolerance;Monitored during session;Repositioned    Home Living Family/patient expects to be discharged to:: Private residence Living Arrangements: Spouse/significant other Available Help at Discharge: Family;Neighbor;Available 24 hours/day Type of Home: House Home Access: Stairs to enter Entrance Stairs-Rails: None Entrance Stairs-Number of Steps: 1 Home Layout: Two level;Able to live on main level with bedroom/bathroom Home Equipment: None      Prior Function Level of Independence: Independent         Comments: Pt denies falls PTA, however admits Lt knee would partially buckle and she "favored it"     Hand Dominance        Extremity/Trunk Assessment   Upper Extremity Assessment: Defer to OT evaluation           Lower Extremity Assessment: LLE deficits/detail      Cervical / Trunk Assessment: Normal  Communication   Communication: Expressive difficulties (I'm thinking one thing, but that's not what comes out)  Cognition Arousal/Alertness: Lethargic;Suspect due to medications (  awakened on arrival) Behavior During Therapy: Flat affect Overall Cognitive Status: Difficult to assess                      General Comments      Exercises        Assessment/Plan    PT Assessment Patient needs continued PT  services  PT Diagnosis Difficulty walking;Acute pain   PT Problem List Decreased activity tolerance;Decreased mobility;Decreased knowledge of use of DME;Decreased knowledge of precautions;Impaired sensation;Pain  PT Treatment Interventions DME instruction;Gait training;Functional mobility training;Therapeutic activities;Cognitive remediation;Patient/family education   PT Goals (Current goals can be found in the Care Plan section) Acute Rehab PT Goals Patient Stated Goal: unable due to drowsiness and RN interruption PT Goal Formulation: Patient unable to participate in goal setting Time For Goal Achievement: 06/09/15 Potential to Achieve Goals: Good    Frequency Min 5X/week   Barriers to discharge        Co-evaluation               End of Session Equipment Utilized During Treatment: Gait belt;Back brace Activity Tolerance: Patient limited by lethargy Patient left: in chair;with call bell/phone within reach;with nursing/sitter in room (chair alarm pad under pt; no box in room) Nurse Communication: Mobility status;Other (comment) (no alarm box in room)         Time: [id number] PT Time Calculation (min) (ACUTE ONLY): 34 min   Charges:   PT Evaluation $Initial PT Evaluation Tier I: 1 Procedure PT Treatments $Gait Training: 8-22 mins   PT G Codes:        Fleur Audino 06/11/15, 10:50 AM Pager [phone]

## 2015-06-03 NOTE — Progress Notes (Signed)
Physical Therapy Treatment Patient Details Name: Judith Chavez MRN: [id number] DOB: [birth year] Today's Date: 06/03/2015    History of Present Illness Adm 06/01/15 for L3-5 laminectomy and fusion  PMHx- anxiety, depression, Lt foot/leg numbness    PT Comments    Patient still slightly groggy, however much more clear than 11/22. Tolerated increased activity and required near constant cues to incr step length and gait velocity.   Follow Up Recommendations  Home health PT;Supervision for mobility/OOB     Equipment Recommendations  Rolling walker with 5" wheels    Recommendations for Other Services       Precautions / Restrictions Precautions Precautions: Back;Fall Precaution Booklet Issued: Yes (comment) (she had lost original) Precaution Comments: could not recall all prec (despite recent OT session) Required Braces or Orthoses: Spinal Brace Spinal Brace: Lumbar corset;Applied in sitting position Restrictions Weight Bearing Restrictions: No    Mobility  Bed Mobility Overal bed mobility: Needs Assistance Bed Mobility: Rolling;Sit to Sidelying Rolling: Min assist Sidelying to sit: Min assist     Sit to sidelying: Mod assist General bed mobility comments: vc for sequencing and maintain back precautions  Transfers Overall transfer level: Needs assistance Equipment used: Rolling walker (2 wheeled) Transfers: Sit to/from Stand Sit to Stand: Min assist         General transfer comment: required repeated cues for sequence and safe use of RW; repeated x 2  Ambulation/Gait Ambulation/Gait assistance: Min assist Ambulation Distance (Feet): 180 Feet Assistive device: Rolling walker (2 wheeled) Gait Pattern/deviations: Step-through pattern;Decreased stride length;Shuffle Gait velocity: very slow   General Gait Details: pt taking extremely short steps; able to incr step length and velocity significantly with repeated cues   Stairs            Wheelchair  Mobility    Modified Rankin (Stroke Patients Only)       Balance Overall balance assessment: Needs assistance Sitting-balance support: No upper extremity supported Sitting balance-Leahy Scale: Fair     Standing balance support: Bilateral upper extremity supported Standing balance-Leahy Scale: Fair                      Cognition Arousal/Alertness: Lethargic;Suspect due to medications Behavior During Therapy: Flat affect Overall Cognitive Status: Within Functional Limits for tasks assessed                      Exercises      General Comments        Pertinent Vitals/Pain Pain Assessment: 0-10 Pain Score: 4  Pain Location: back; Lt lower leg Pain Descriptors / Indicators: Burning;Operative site guarding Pain Intervention(s): Limited activity within patient's tolerance;Monitored during session;Repositioned;Ice applied    Home Living                      Prior Function            PT Goals (current goals can now be found in the care plan section) Acute Rehab PT Goals Patient Stated Goal: to go home Time For Goal Achievement: 06/09/15 Progress towards PT goals: Progressing toward goals    Frequency  Min 5X/week    PT Plan Current plan remains appropriate    Co-evaluation             End of Session Equipment Utilized During Treatment: Gait belt;Back brace Activity Tolerance: Patient tolerated treatment well Patient left: with call bell/phone within reach;in bed;with bed alarm set     Time: 0940-1009 PT Time  Calculation (min) (ACUTE ONLY): 29 min  Charges:  $Gait Training: 23-37 mins                    G Codes:      Judith Chavez Jun 13, 2015, 12:13 PM Pager [phone]

## 2015-06-03 NOTE — Progress Notes (Signed)
Subjective: Patient reports No leg pain minimal back pain  Objective: Vital signs in last 24 hours: Temp:  [98 F (36.7 C)-98.6 F (37 C)] 98.1 F (36.7 C) (11/23 0118) Pulse Rate:  [67-77] 72 (11/23 0118) Resp:  [16-20] 20 (11/23 0118) BP: (101-121)/(60-81) 101/81 mmHg (11/23 0118) SpO2:  [91 %-98 %] 94 % (11/23 0118)  Intake/Output from previous day: 11/22 0701 - 11/23 0700 In: -  Out: 520 [Drains:520] Intake/Output this shift: Total I/O In: -  Out: 350 [Drains:350]  Strength 5 out of 5 wound clean dry and intact  Lab Results: No results for input(s): WBC, HGB, HCT, PLT in the last 72 hours. BMET No results for input(s): NA, K, CL, CO2, GLUCOSE, BUN, CREATININE, CALCIUM in the last 72 hours.  Studies/Results: Dg Lumbar Spine 2-3 Views  06/01/2015  CLINICAL DATA:  Lumbar spine surgery. EXAM: LUMBAR SPINE - 2-3 VIEW COMPARISON:  MRI 07/13/2014 . FINDINGS: Lumbar spine numbered as per prior MRI of 07/13/2014 . Postsurgical changes are noted from L3 through L5. Inter disc fusion L3- L4, L4-L5. IMPRESSION: Postsurgical changes lumbar spine. Electronically Signed   By: Marcello Moores  Register   On: 06/01/2015 12:44   Dg C-arm Gt 120 Min  06/01/2015  CLINICAL DATA:  Lumbar spine surgery. EXAM: DG C-ARM GT 120 MIN CONTRAST:  None. FLUOROSCOPY TIME:  Fluoroscopy Time (in minutes and seconds): 0 minutes, 0 seconds fluoroscopy time reported Number of Acquired Images:  2 images COMPARISON:  MRI of 07/13/2014. FINDINGS: Lumbar vertebra numbered as per prior MRI of 07/13/2014 . L3-L4 and L4-L5 postsurgical changes are present. Inter disc fusion device is noted L3-L4 and L4-L5. Good anatomic alignment. IMPRESSION: Postsurgical changes L3 through L5. Good anatomic alignment. No acute abnormality. Electronically Signed   By: Marcello Moores  Register   On: 06/01/2015 12:50    Assessment/Plan: 69 year old 2 days status post PLIF tumor well keep working with physical therapy  LOS: 2 days     Judith Chavez  P 06/03/2015, 6:20 AM

## 2015-06-03 NOTE — Progress Notes (Signed)
Occupational Therapy Treatment/Discharge Patient Details Name: Rhilyn Chavez MRN: [id number] DOB: 10-Dec-1945 Today's Date: 06/03/2015    History of present illness Adm 06/01/15 for L3-5 laminectomy and fusion  PMHx- anxiety, depression, Lt foot/leg numbness   OT comments  All education completed with pt and husband. Pt demonstrated good adherence to back precautions during earlier session. Pt has no further acute OT needs. OT signing off.   Follow Up Recommendations  No OT follow up;Supervision/Assistance - 24 hour    Equipment Recommendations  3 in 1 bedside comode;Other (comment)    Recommendations for Other Services      Precautions / Restrictions Precautions Precautions: Back;Fall Precaution Booklet Issued: Yes (comment) (she had lost original) Precaution Comments: could not recall all prec (despite recent OT session) Required Braces or Orthoses: Spinal Brace Spinal Brace: Lumbar corset;Applied in sitting position Restrictions Weight Bearing Restrictions: No       Mobility Bed Mobility Overal bed mobility: Needs Assistance Bed Mobility: Rolling;Sit to Sidelying Rolling: Min assist       Sit to sidelying: Mod assist General bed mobility comments: vc for sequencing and maintain back precautions  Transfers Overall transfer level: Needs assistance Equipment used: Rolling walker (2 wheeled) Transfers: Sit to/from Stand Sit to Stand: Min assist         General transfer comment: required repeated cues for sequence and safe use of RW; repeated x 2    Balance                                   ADL Overall ADL's : Needs assistance/impaired                 Upper Body Dressing : Minimal assistance;Sitting Upper Body Dressing Details (indicate cue type and reason): Verbal cues for proper placement of back brace                   General ADL Comments: Educated and practiced don/doff back brace with pt's husband. Also reviewed brace wear  schedule, back precautions, and activity progression        Vision                     Perception     Praxis      Cognition   Behavior During Therapy: Flat affect Overall Cognitive Status: Within Functional Limits for tasks assessed                       Extremity/Trunk Assessment               Exercises     Shoulder Instructions       General Comments      Pertinent Vitals/ Pain       Pain Assessment: 0-10 Pain Score: 3  Pain Location: back Pain Descriptors / Indicators: Aching Pain Intervention(s): Limited activity within patient's tolerance;Repositioned  Home Living                                          Prior Functioning/Environment              Frequency       Progress Toward Goals  OT Goals(current goals can now be found in the care plan section)  Progress towards OT goals: Goals met/education completed, patient discharged  from OT  Acute Rehab OT Goals Patient Stated Goal: to go home OT Goal Formulation: With patient Time For Goal Achievement: 06/16/15 Potential to Achieve Goals: Good ADL Goals Pt Will Perform Lower Body Bathing: with supervision;with adaptive equipment;sit to/from stand Pt Will Perform Lower Body Dressing: with supervision;with adaptive equipment;sit to/from stand Pt Will Transfer to Toilet: with supervision;ambulating;bedside commode Pt Will Perform Toileting - Clothing Manipulation and hygiene: with supervision;sitting/lateral leans Pt Will Perform Tub/Shower Transfer: Shower transfer;with supervision;ambulating;rolling walker Additional ADL Goal #1: Pt will verbalize 3/3 back precautions.  Plan All goals met and education completed, patient discharged from OT services    Co-evaluation                 End of Session Equipment Utilized During Treatment: Back brace   Activity Tolerance Patient tolerated treatment well   Patient Left in chair;with call bell/phone within  reach;with family/visitor present   Nurse Communication          Time: 0981-1914 OT Time Calculation (min): 10 min  Charges: OT General Charges $OT Visit: 1 Procedure OT Treatments $Self Care/Home Management : 8-22 mins  Redmond Baseman 06/03/2015, 1:35 PM

## 2015-06-03 NOTE — Clinical Social Work Note (Signed)
CSW Consult Acknowledged:   CSW received a consult for SNF placement. Per PT/OT's evaluation the appropriate level of care is home. CSW will sign off.     Clinical Social Worker  Hershy Flenner, MSW, LCSW [phone]

## 2015-06-03 NOTE — Progress Notes (Signed)
Occupational Therapy Treatment Patient Details Name: Judith Chavez MRN: [id number] DOB: 11/21/1945 Today's Date: 06/03/2015    History of present illness Adm 06/01/15 for L3-5 laminectomy and fusion  PMHx- anxiety, depression, Lt foot/leg numbness   OT comments  Pt is progressing well. Pt required min assist for ADL and mobility and demonstrated good adherence to all back precautions. Educated pt on and practiced with AE for ADLs. D/C recommendations remain appropriate. Will continue to follow acutely to address OT needs and goals.   Follow Up Recommendations  No OT follow up;Supervision/Assistance - 24 hour    Equipment Recommendations  3 in 1 bedside comode;Other (comment)    Recommendations for Other Services      Precautions / Restrictions Precautions Precautions: Back;Fall Precaution Comments: Pt able to recall 3/3 back precautions Spinal Brace: Applied in sitting position Restrictions Weight Bearing Restrictions: No       Mobility Bed Mobility Overal bed mobility: Needs Assistance Bed Mobility: Rolling;Sidelying to Sit Rolling: Min guard Sidelying to sit: Min assist       General bed mobility comments: Pt demonstrating good log roll technique without cues. Min assist to support trunk to come to sitting position.  Transfers Overall transfer level: Needs assistance Equipment used: Rolling walker (2 wheeled) Transfers: Sit to/from Stand Sit to Stand: Min assist;From elevated surface         General transfer comment: Min assist for boost to stand. Elevated bed to simulate home environment.    Balance Overall balance assessment: Needs assistance Sitting-balance support: No upper extremity supported Sitting balance-Leahy Scale: Fair     Standing balance support: Bilateral upper extremity supported Standing balance-Leahy Scale: Fair                     ADL Overall ADL's : Needs assistance/impaired     Grooming: Wash/dry face;Set up;Sitting    Upper Body Bathing: Set up;Sitting   Lower Body Bathing: Minimal assistance;Sit to/from stand;Cueing for sequencing;Cueing for compensatory techniques   Upper Body Dressing : Minimal assistance;Cueing for compensatory techniques;Sitting Upper Body Dressing Details (indicate cue type and reason): Verbal cues to properly don brace Lower Body Dressing: Minimal assistance;Cueing for compensatory techniques;Cueing for back precautions;Sit to/from stand Lower Body Dressing Details (indicate cue type and reason): Verbal cues for ankle-over-knee strategy for dressing Toilet Transfer: Minimal assistance;Ambulation;BSC   Toileting- Clothing Manipulation and Hygiene: Minimal assistance;With adaptive equipment;Sit to/from stand       Functional mobility during ADLs: Min guard;Rolling walker General ADL Comments: Pt educated and practiced with AE for LB ADL. Pt able to squat to complete pericare and bathing without breaking precautions. Pt able to cross ankle-over-knee to complete LB dressing. Pt has reacher at home and does not feel she will need any other AE. Husband not present during session for education on brace.      Vision                     Perception     Praxis      Cognition   Behavior During Therapy: Flat affect Overall Cognitive Status: Within Functional Limits for tasks assessed                       Extremity/Trunk Assessment               Exercises     Shoulder Instructions       General Comments      Pertinent Vitals/ Pain  Pain Assessment: 0-10 Pain Score: 5  Pain Location: low back Pain Descriptors / Indicators: Aching;Operative site guarding Pain Intervention(s): Monitored during session;Repositioned  Home Living                                          Prior Functioning/Environment              Frequency Min 3X/week     Progress Toward Goals  OT Goals(current goals can now be found in the care plan  section)  Progress towards OT goals: Progressing toward goals  Acute Rehab OT Goals Patient Stated Goal: to go home OT Goal Formulation: With patient Time For Goal Achievement: 06/16/15 Potential to Achieve Goals: Good ADL Goals Pt Will Perform Lower Body Bathing: with supervision;with adaptive equipment;sit to/from stand Pt Will Perform Lower Body Dressing: with supervision;with adaptive equipment;sit to/from stand Pt Will Transfer to Toilet: with supervision;ambulating;bedside commode Pt Will Perform Toileting - Clothing Manipulation and hygiene: with supervision;sitting/lateral leans Pt Will Perform Tub/Shower Transfer: Shower transfer;with supervision;ambulating;rolling walker Additional ADL Goal #1: Pt will verbalize 3/3 back precautions.  Plan Discharge plan remains appropriate    Co-evaluation                 End of Session Equipment Utilized During Treatment: Gait belt;Rolling walker;Back brace   Activity Tolerance Patient tolerated treatment well   Patient Left in chair;with call bell/phone within reach   Nurse Communication Other (comment) (No chair alarm box in room, pt up in chair)        Time: [id number] OT Time Calculation (min): 40 min  Charges: OT General Charges $OT Visit: 1 Procedure OT Treatments $Self Care/Home Management : 38-52 mins  Redmond Baseman 06/03/2015, 10:00 AM

## 2015-06-04 MED ORDER — SENNA 8.6 MG PO TABS
1.0000 | ORAL_TABLET | Freq: Two times a day (BID) | ORAL | Status: DC
  Administered 2015-06-04 – 2015-06-05 (×2): 8.6 mg via ORAL
  Filled 2015-06-04 (×2): qty 1

## 2015-06-04 NOTE — Progress Notes (Signed)
Patient ID: Judith Chavez, female   DOB: 03/17/1946, 69 y.o.   MRN: [id number] Judith Chavez is doing great with no leg pain except tenderness right around and above her left ankle that does not appear to be radicular. This appears to be more like tendinitis and has been improved with intermittent removal of her SCDs.  Strength out of 5 wound clean dry and intact  Mobilized today hopeful discharge tomorrow

## 2015-06-05 MED ORDER — CYCLOBENZAPRINE HCL 10 MG PO TABS: 10.0000 mg | ORAL_TABLET | Freq: Three times a day (TID) | ORAL | Status: DC | PRN

## 2015-06-05 MED ORDER — OXYCODONE-ACETAMINOPHEN 5-325 MG PO TABS: 1.0000 | ORAL_TABLET | Freq: Four times a day (QID) | ORAL | Status: DC | PRN

## 2015-06-05 NOTE — Progress Notes (Signed)
Discharged home via w/c with husband. All discharge instructions reviewed thoroughly with husband and patient. Pt home via w/c.

## 2015-06-05 NOTE — Discharge Summary (Signed)
Physician Discharge Summary  Patient ID: Judith Chavez MRN: [id number] DOB/AGE: [birth year] 69 y.o.  Admit date: 06/01/2015 Discharge date: 06/05/2015  Admission Diagnoses: Spondylolisthesis   Discharge Diagnoses: Same   Discharged Condition: good  Hospital Course: The patient was admitted on 06/01/2015 and taken to the operating room where the patient underwent PLIF. The patient tolerated the procedure well and was taken to the recovery room and then to the floor in stable condition. The hospital course was routine. There were no complications. The wound remained clean dry and intact. Pt had appropriate back soreness. No complaints of leg pain or new N/T/W. The patient remained afebrile with stable vital signs, and tolerated a regular diet. The patient continued to increase activities, and pain was well controlled with oral pain medications.   Consults: None  Significant Diagnostic Studies:  Results for orders placed or performed during the hospital encounter of 05/22/15  Surgical pcr screen  Result Value Ref Range   MRSA, PCR NEGATIVE NEGATIVE   Staphylococcus aureus NEGATIVE NEGATIVE  Basic metabolic panel  Result Value Ref Range   Sodium 139 135 - 145 mmol/L   Potassium 4.2 3.5 - 5.1 mmol/L   Chloride 108 101 - 111 mmol/L   CO2 22 22 - 32 mmol/L   Glucose, Bld 156 (H) 65 - 99 mg/dL   BUN 10 6 - 20 mg/dL   Creatinine, Ser 0.55 0.44 - 1.00 mg/dL   Calcium 9.4 8.9 - 10.3 mg/dL   GFR calc non Af Amer >60 >60 mL/min   GFR calc Af Amer >60 >60 mL/min   Anion gap 9 5 - 15  CBC  Result Value Ref Range   WBC 5.7 4.0 - 10.5 K/uL   RBC 4.98 3.87 - 5.11 MIL/uL   Hemoglobin 13.6 12.0 - 15.0 g/dL   HCT 42.1 36.0 - 46.0 %   MCV 84.5 78.0 - 100.0 fL   MCH 27.3 26.0 - 34.0 pg   MCHC 32.3 30.0 - 36.0 g/dL   RDW 13.3 11.5 - 15.5 %   Platelets 225 150 - 400 K/uL  Type and screen All Cardiac and thoracic surgeries, spinal fusions, myomectomies, craniotomies, colon & liver resections,  total joint revisions, same day c-section with placenta previa or accreta.  Result Value Ref Range   ABO/RH(D) A POS    Antibody Screen NEG    Sample Expiration 06/05/2015    Extend sample reason NO TRANSFUSIONS OR PREGNANCY IN THE PAST 3 MONTHS   ABO/Rh  Result Value Ref Range   ABO/RH(D) A POS     Dg Lumbar Spine 2-3 Views  06/01/2015  CLINICAL DATA:  Lumbar spine surgery. EXAM: LUMBAR SPINE - 2-3 VIEW COMPARISON:  MRI 07/13/2014 . FINDINGS: Lumbar spine numbered as per prior MRI of 07/13/2014 . Postsurgical changes are noted from L3 through L5. Inter disc fusion L3- L4, L4-L5. IMPRESSION: Postsurgical changes lumbar spine. Electronically Signed   By: Marcello Moores  Register   On: 06/01/2015 12:44   Dg C-arm Gt 120 Min  06/01/2015  CLINICAL DATA:  Lumbar spine surgery. EXAM: DG C-ARM GT 120 MIN CONTRAST:  None. FLUOROSCOPY TIME:  Fluoroscopy Time (in minutes and seconds): 0 minutes, 0 seconds fluoroscopy time reported Number of Acquired Images:  2 images COMPARISON:  MRI of 07/13/2014. FINDINGS: Lumbar vertebra numbered as per prior MRI of 07/13/2014 . L3-L4 and L4-L5 postsurgical changes are present. Inter disc fusion device is noted L3-L4 and L4-L5. Good anatomic alignment. IMPRESSION: Postsurgical changes L3 through L5. Good anatomic alignment.  No acute abnormality. Electronically Signed   By: Marcello Moores  Register   On: 06/01/2015 12:50    Antibiotics:  Anti-infectives    Start     Dose/Rate Route Frequency Ordered Stop   06/01/15 1700  ceFAZolin (ANCEF) IVPB 2 g/50 mL premix     2 g 100 mL/hr over 30 Minutes Intravenous Every 8 hours 06/01/15 1531 06/03/15 1206   06/01/15 1230  vancomycin (VANCOCIN) powder  Status:  Discontinued       As needed 06/01/15 1230 06/01/15 1259   06/01/15 0956  bacitracin 50,000 Units in sodium chloride irrigation 0.9 % 500 mL irrigation  Status:  Discontinued       As needed 06/01/15 0956 06/01/15 1259   06/01/15 0932  vancomycin (VANCOCIN) 1000 MG powder     Comments:  Carroll Kinds   : cabinet override      06/01/15 0932 06/01/15 2144   06/01/15 0607  ceFAZolin (ANCEF) IVPB 2 g/50 mL premix     2 g 100 mL/hr over 30 Minutes Intravenous On call to O.R. 06/01/15 [id number] 06/01/15 0905      Discharge Exam: Blood pressure 137/82, pulse 65, temperature 98.1 F (36.7 C), temperature source Oral, resp. rate 18, height 5\' 2"  (1.575 m), weight 70.761 kg (156 lb), SpO2 100 %. Neurologic: Grossly normal Dressing dry  Discharge Medications:     Medication List    STOP taking these medications        acetaminophen-codeine 300-30 MG tablet  Commonly known as:  TYLENOL #3     meloxicam 15 MG tablet  Commonly known as:  MOBIC      TAKE these medications        cyclobenzaprine 10 MG tablet  Commonly known as:  FLEXERIL  Take 1 tablet (10 mg total) by mouth 3 (three) times daily as needed for muscle spasms.     NASACORT ALLERGY 24HR NA  Place 1 spray into both nostrils daily as needed (for allergies).     oxyCODONE-acetaminophen 5-325 MG tablet  Commonly known as:  PERCOCET/ROXICET  Take 1-2 tablets by mouth every 6 (six) hours as needed for moderate pain.     REFRESH TEARS OP  Place 1 drop into the right eye as needed (for dry eyes).     sertraline 50 MG tablet  Commonly known as:  ZOLOFT  Take 50 mg by mouth every other day.        Disposition: Home   Final Dx: PLIF      Discharge Instructions    Call MD for:  difficulty breathing, headache or visual disturbances    Complete by:  As directed      Call MD for:  persistant nausea and vomiting    Complete by:  As directed      Call MD for:  redness, tenderness, or signs of infection (pain, swelling, redness, odor or green/yellow discharge around incision site)    Complete by:  As directed      Call MD for:  severe uncontrolled pain    Complete by:  As directed      Call MD for:  temperature >100.4    Complete by:  As directed      Diet - low sodium heart healthy     Complete by:  As directed      Discharge instructions    Complete by:  As directed   No bending or twisting, no heavy lifting, may shower, no driving     Increase activity  slowly    Complete by:  As directed      Remove dressing in 48 hours    Complete by:  As directed            Follow-up Information    Follow up with CRAM,GARY P, MD. Schedule an appointment as soon as possible for a visit in 2 weeks.   Specialty:  Neurosurgery   Contact information:   [address] [place] [address] [phone]        Signed: Eustace Moore 06/05/2015, 9:11 AM

## 2015-06-05 NOTE — Care Management Note (Signed)
Case Management Note  Patient Details  Name: Judith Chavez MRN: [id number] Date of Birth: 1945/07/19  Subjective/Objective:                    Action/Plan: Patient being discharged home today with home health PT and DME orders. CM met with the patient and provided her a list of home health agencies in the [facility] area. She selected [facility]. Butch Penny with [facility] notified and accepted the referral. Pt also ordered a rolling walker and a 3 in 1. Tiffany with [facility] DME notified and equipment is going to be delivered to the room. Will update bedside RN.   Expected Discharge Date:   (Pending)               Expected Discharge Plan:  [place]  In-House Referral:     Discharge planning Services  CM Consult  Post Acute Care Choice:  Home Health, Durable Medical Equipment Choice offered to:  Patient  DME Arranged:  3-N-1, Walker rolling DME Agency:  [place]:  PT [place]:  [facility]  Status of Service:  Completed, signed off  Medicare Important Message Given:    Date Medicare IM Given:    Medicare IM give by:    Date Additional Medicare IM Given:    Additional Medicare Important Message give by:     If discussed at [place] of Stay Meetings, dates discussed:    Additional Comments:  Pollie Friar, RN 06/05/2015, 10:49 AM

## 2015-06-06 DIAGNOSIS — M4806 Spinal stenosis, lumbar region: Secondary | ICD-10-CM | POA: Diagnosis not present

## 2015-06-06 DIAGNOSIS — Z87891 Personal history of nicotine dependence: Secondary | ICD-10-CM | POA: Diagnosis not present

## 2015-06-06 DIAGNOSIS — M5136 Other intervertebral disc degeneration, lumbar region: Secondary | ICD-10-CM | POA: Diagnosis not present

## 2015-06-06 DIAGNOSIS — F419 Anxiety disorder, unspecified: Secondary | ICD-10-CM | POA: Diagnosis not present

## 2015-06-06 DIAGNOSIS — M170 Bilateral primary osteoarthritis of knee: Secondary | ICD-10-CM | POA: Diagnosis not present

## 2015-06-06 DIAGNOSIS — F329 Major depressive disorder, single episode, unspecified: Secondary | ICD-10-CM | POA: Diagnosis not present

## 2015-06-06 DIAGNOSIS — Z4889 Encounter for other specified surgical aftercare: Secondary | ICD-10-CM | POA: Diagnosis not present

## 2015-06-10 DIAGNOSIS — M4806 Spinal stenosis, lumbar region: Secondary | ICD-10-CM | POA: Diagnosis not present

## 2015-06-10 DIAGNOSIS — F329 Major depressive disorder, single episode, unspecified: Secondary | ICD-10-CM | POA: Diagnosis not present

## 2015-06-10 DIAGNOSIS — F419 Anxiety disorder, unspecified: Secondary | ICD-10-CM | POA: Diagnosis not present

## 2015-06-10 DIAGNOSIS — M5136 Other intervertebral disc degeneration, lumbar region: Secondary | ICD-10-CM | POA: Diagnosis not present

## 2015-06-10 DIAGNOSIS — Z4889 Encounter for other specified surgical aftercare: Secondary | ICD-10-CM | POA: Diagnosis not present

## 2015-06-10 DIAGNOSIS — M170 Bilateral primary osteoarthritis of knee: Secondary | ICD-10-CM | POA: Diagnosis not present

## 2015-06-11 ENCOUNTER — Emergency Department (HOSPITAL_COMMUNITY)
Admission: EM | Admit: 2015-06-11 | Discharge: 2015-06-12 | Disposition: A | Discharge status: Home / Self Care | Payer: Medicare Other | Attending: Emergency Medicine | Admitting: Emergency Medicine

## 2015-06-11 ENCOUNTER — Encounter (HOSPITAL_COMMUNITY): Payer: Self-pay | Admitting: Emergency Medicine

## 2015-06-11 ENCOUNTER — Emergency Department (HOSPITAL_COMMUNITY): Payer: Medicare Other

## 2015-06-11 DIAGNOSIS — Z79899 Other long term (current) drug therapy: Secondary | ICD-10-CM | POA: Insufficient documentation

## 2015-06-11 DIAGNOSIS — Z87891 Personal history of nicotine dependence: Secondary | ICD-10-CM | POA: Insufficient documentation

## 2015-06-11 DIAGNOSIS — M1990 Unspecified osteoarthritis, unspecified site: Secondary | ICD-10-CM | POA: Insufficient documentation

## 2015-06-11 DIAGNOSIS — F419 Anxiety disorder, unspecified: Secondary | ICD-10-CM | POA: Insufficient documentation

## 2015-06-11 DIAGNOSIS — M545 Low back pain, unspecified: Secondary | ICD-10-CM

## 2015-06-11 DIAGNOSIS — M5136 Other intervertebral disc degeneration, lumbar region: Secondary | ICD-10-CM | POA: Diagnosis not present

## 2015-06-11 DIAGNOSIS — Z8701 Personal history of pneumonia (recurrent): Secondary | ICD-10-CM | POA: Diagnosis not present

## 2015-06-11 DIAGNOSIS — F329 Major depressive disorder, single episode, unspecified: Secondary | ICD-10-CM | POA: Diagnosis not present

## 2015-06-11 DIAGNOSIS — M4806 Spinal stenosis, lumbar region: Secondary | ICD-10-CM | POA: Diagnosis not present

## 2015-06-11 DIAGNOSIS — G43909 Migraine, unspecified, not intractable, without status migrainosus: Secondary | ICD-10-CM | POA: Diagnosis not present

## 2015-06-11 DIAGNOSIS — R34 Anuria and oliguria: Secondary | ICD-10-CM | POA: Diagnosis not present

## 2015-06-11 DIAGNOSIS — Z4889 Encounter for other specified surgical aftercare: Secondary | ICD-10-CM | POA: Diagnosis not present

## 2015-06-11 DIAGNOSIS — R509 Fever, unspecified: Secondary | ICD-10-CM | POA: Diagnosis present

## 2015-06-11 DIAGNOSIS — M170 Bilateral primary osteoarthritis of knee: Secondary | ICD-10-CM | POA: Diagnosis not present

## 2015-06-11 LAB — CBC WITH DIFFERENTIAL/PLATELET
Basophils Absolute: 0 10*3/uL (ref 0.0–0.1)
Basophils Relative: 0 %
Eosinophils Absolute: 0.1 10*3/uL (ref 0.0–0.7)
Eosinophils Relative: 1 %
HCT: 33.6 % — ABNORMAL LOW (ref 36.0–46.0)
Hemoglobin: 10.9 g/dL — ABNORMAL LOW (ref 12.0–15.0)
Lymphocytes Relative: 30 %
Lymphs Abs: 3.2 10*3/uL (ref 0.7–4.0)
MCH: 27.5 pg (ref 26.0–34.0)
MCHC: 32.4 g/dL (ref 30.0–36.0)
MCV: 84.8 fL (ref 78.0–100.0)
Monocytes Absolute: 0.9 10*3/uL (ref 0.1–1.0)
Monocytes Relative: 8 %
Neutro Abs: 6.4 10*3/uL (ref 1.7–7.7)
Neutrophils Relative %: 61 %
Platelets: 415 10*3/uL — ABNORMAL HIGH (ref 150–400)
RBC: 3.96 MIL/uL (ref 3.87–5.11)
RDW: 13 % (ref 11.5–15.5)
WBC: 10.6 10*3/uL — ABNORMAL HIGH (ref 4.0–10.5)

## 2015-06-11 LAB — COMPREHENSIVE METABOLIC PANEL
ALT: 43 U/L (ref 14–54)
AST: 30 U/L (ref 15–41)
Albumin: 3.2 g/dL — ABNORMAL LOW (ref 3.5–5.0)
Alkaline Phosphatase: 95 U/L (ref 38–126)
Anion gap: 11 (ref 5–15)
BUN: 9 mg/dL (ref 6–20)
CO2: 26 mmol/L (ref 22–32)
Calcium: 9.4 mg/dL (ref 8.9–10.3)
Chloride: 99 mmol/L — ABNORMAL LOW (ref 101–111)
Creatinine, Ser: 0.59 mg/dL (ref 0.44–1.00)
GFR calc Af Amer: >60 mL/min (ref >60)
GFR calc non Af Amer: >60 mL/min (ref >60)
Glucose, Bld: 140 mg/dL — ABNORMAL HIGH (ref 65–99)
Potassium: 4.4 mmol/L (ref 3.5–5.1)
Sodium: 136 mmol/L (ref 135–145)
Total Bilirubin: 0.8 mg/dL (ref 0.3–1.2)
Total Protein: 7 g/dL (ref 6.5–8.1)

## 2015-06-11 LAB — URINALYSIS, ROUTINE W REFLEX MICROSCOPIC
Bilirubin Urine: NEGATIVE
Glucose, UA: NEGATIVE mg/dL
Hgb urine dipstick: NEGATIVE
Ketones, ur: NEGATIVE mg/dL
Leukocytes, UA: NEGATIVE
Nitrite: NEGATIVE
Protein, ur: NEGATIVE mg/dL
Specific Gravity, Urine: 1.011 (ref 1.005–1.030)
pH: 7 (ref 5.0–8.0)

## 2015-06-11 MED ORDER — ONDANSETRON HCL 4 MG/2ML IJ SOLN
4.0000 mg | Freq: Once | INTRAMUSCULAR | Status: AC
  Administered 2015-06-11: 4 mg via INTRAVENOUS
  Filled 2015-06-11: qty 2

## 2015-06-11 MED ORDER — IOHEXOL 300 MG/ML  SOLN
100.0000 mL | Freq: Once | INTRAMUSCULAR | Status: AC | PRN
  Administered 2015-06-11: 500 mL via INTRAVENOUS

## 2015-06-11 MED ORDER — OXYCODONE-ACETAMINOPHEN 5-325 MG PO TABS
1.0000 | ORAL_TABLET | Freq: Once | ORAL | Status: AC
  Administered 2015-06-11: 1 via ORAL
  Filled 2015-06-11: qty 1

## 2015-06-11 MED ORDER — IOHEXOL 300 MG/ML  SOLN: 25.0000 mL | Freq: Once | INTRAMUSCULAR | Status: DC | PRN

## 2015-06-11 NOTE — ED Provider Notes (Signed)
CSN: [id number]     Arrival date & time 06/11/15  1805 History   First MD Initiated Contact with Patient 06/11/15 1820     Chief Complaint  Patient presents with  . Fever  . Urinary Tract Infection      HPI  Patient is a 69 y/o AAF s/p lumbar fusion surgery on 11/21 here with a CC of fever, LLQ pain, dysuria.  Patient reports a 100.4 temp on Tuesday.  She called her PCP who told her to take Tylenol.  On Wednesday her temperature was 102.  Also on Wednesday she reports urinary incontinence x3 without the urgency to void.  Today, she reports a 100.6 temp, dysuria, urinary urgency without the ability to void, and her first BM since 11/19. Her LLQ abdominal pain began after her bowel movement and is sharp in nature.  Denies nausea, vomiting, vaginal discharge.    Past Medical History  Diagnosis Date  . History of pneumonia   . Anxiety   . Depression   . Headache     migraines  . Arthritis     knees  . Numbness     "left foot"    Past Surgical History  Procedure Laterality Date  . Cesarean section  1970  . Abcess drainage      "after cesarean- in hospital for 3 months"  . Abdominal hysterectomy    . Cholecystectomy    . Eye surgery Right   . Colonoscopy    . Meniscus repair Bilateral    No family history on file. Social History  Substance Use Topics  . Smoking status: Former Smoker    Quit date: 07/11/1986  . Smokeless tobacco: None  . Alcohol Use: Yes     Comment: occassionally   OB History    No data available     Review of Systems All other systems negative except as documented in the HPI. All pertinent positives and negatives as reviewed in the HPI.    Allergies  Review of patient's allergies indicates no known allergies.  Home Medications   Prior to Admission medications   Medication Sig Start Date End Date Taking? Authorizing Provider  acetaminophen (TYLENOL) 500 MG tablet Take 1,000 mg by mouth every 6 (six) hours as needed for fever.   Yes Historical  Provider, MD  Carboxymethylcellulose Sodium (REFRESH TEARS OP) Place 1 drop into the right eye as needed (for dry eyes).   Yes Historical Provider, MD  cyclobenzaprine (FLEXERIL) 10 MG tablet Take 1 tablet (10 mg total) by mouth 3 (three) times daily as needed for muscle spasms. 06/05/15  Yes Eustace Moore, MD  oxyCODONE-acetaminophen (PERCOCET/ROXICET) 5-325 MG tablet Take 1-2 tablets by mouth every 6 (six) hours as needed for moderate pain. 06/05/15  Yes Eustace Moore, MD  sertraline (ZOLOFT) 50 MG tablet Take 50 mg by mouth every other day.   Yes Historical Provider, MD  Triamcinolone Acetonide (NASACORT ALLERGY 24HR NA) Place 1 spray into both nostrils daily as needed (for allergies).   Yes Historical Provider, MD   BP 137/77 mmHg  Pulse 77  Temp(Src) 99.4 F (37.4 C) (Oral)  Resp 18  Ht 5\' 1"  (1.549 m)  Wt 68.947 kg  BMI 28.74 kg/m2  SpO2 94% Physical Exam  Constitutional: She is oriented to person, place, and time. She appears well-developed and well-nourished. No distress.  HENT:  Head: Normocephalic and atraumatic.  Mouth/Throat: Oropharynx is clear and moist.  Eyes: Pupils are equal, round, and reactive to light.  Neck: Normal range of motion. Neck supple.  Cardiovascular: Normal rate, regular rhythm and normal heart sounds.  Exam reveals no gallop and no friction rub.   No murmur heard. Pulmonary/Chest: Effort normal and breath sounds normal. No respiratory distress. She has no wheezes.  Abdominal: Soft. She exhibits no distension. Bowel sounds are decreased. There is tenderness in the left lower quadrant.  Neurological: She is alert and oriented to person, place, and time. She exhibits normal muscle tone. Coordination normal.  Skin: Skin is warm and dry. No erythema.  Psychiatric: She has a normal mood and affect. Her behavior is normal.  Nursing note and vitals reviewed.   ED Course  Procedures (including critical care time) Labs Review Labs Reviewed  COMPREHENSIVE  METABOLIC PANEL - Abnormal; Notable for the following:    Chloride 99 (*)    Glucose, Bld 140 (*)    Albumin 3.2 (*)    All other components within normal limits  CBC WITH DIFFERENTIAL/PLATELET - Abnormal; Notable for the following:    WBC 10.6 (*)    Hemoglobin 10.9 (*)    HCT 33.6 (*)    Platelets 415 (*)    All other components within normal limits  URINE CULTURE  URINALYSIS, ROUTINE W REFLEX MICROSCOPIC (NOT AT [facility])    Imaging Review No results found. I have personally reviewed and evaluated these images and lab results as part of my medical decision-making.   EKG Interpretation   Date/Time:  Thursday June 11 2015 18:10:05 EST Ventricular Rate:  80 PR Interval:  120 QRS Duration: 68 QT Interval:  342 QTC Calculation: 394 R Axis:   61 Text Interpretation:  Normal sinus rhythm with sinus arrhythmia  Nonspecific T wave abnormality Abnormal ECG Confirmed by COOK  MD, BRIAN  [phone]) on 06/11/2015 7:20:26 PM      Assessment & Plan Abdominal pain - abdominal CT negative, order abdominal MRI to r/o epidural abscess  Dysuria - UA negative for UTI  Patient will be signed out with MRI pending   Dalia Heading, PA-C 06/17/15 [place], MD 06/18/15 1332

## 2015-06-11 NOTE — ED Notes (Signed)
Pt arrives from home via [facility] c/o fever since Tuesday with decreased urine output.  Pt had lumbar fusion surgery 06/01/15.  Surgical site appears appropriate for ethnicity, no redness, swelling, heat noted.  Pt denies dysuria.  Pt reports using Tylenol to control fever.  NAD noted at this time, resp e/u.

## 2015-06-11 NOTE — ED Notes (Signed)
Patient transported to CT 

## 2015-06-12 ENCOUNTER — Emergency Department (HOSPITAL_COMMUNITY): Payer: Medicare Other

## 2015-06-12 DIAGNOSIS — M545 Low back pain: Secondary | ICD-10-CM | POA: Diagnosis not present

## 2015-06-12 MED ORDER — GADOBENATE DIMEGLUMINE 529 MG/ML IV SOLN
15.0000 mL | Freq: Once | INTRAVENOUS | Status: AC
  Administered 2015-06-12: 15 mL via INTRAVENOUS

## 2015-06-12 NOTE — ED Notes (Signed)
Pt back from MRI 

## 2015-06-12 NOTE — ED Notes (Signed)
Patient transported to MRI 

## 2015-06-12 NOTE — ED Provider Notes (Signed)
Lumbar laminectomy 2 weeks ago   Presents with reported fever and back pain x 2days Non-focal neuro exam MRI pending r/o abscess, infection  2:50: Patient re-examined. She is feeling better here than at home. Work up including labs, CT abd/pel neg, MRI reviewed with dr. Claudine Mouton and feel it shows post-operative changes only. No evidence infection or abscess. She can be discharged home with follow up with neurosurgery as scheduled and with PCP if she has any further fever at home. Discussed return precautions.  Charlann Lange, PA-C 06/12/15 [id number]  Nat Christen, MD 06/13/15 [phone]

## 2015-06-12 NOTE — Discharge Instructions (Signed)
CONTINUE CURRENT MEDICATIONS AT HOME. FOLLOW UP WITH YOUR DOCTORS AS DISCUSSED AND RETURN TO THE EMERGENCY DEPARTMENT AS NEEDED FOR ANY CHANGE IN SYMPTOMS OR NEW CONCERN.

## 2015-06-13 DIAGNOSIS — M170 Bilateral primary osteoarthritis of knee: Secondary | ICD-10-CM | POA: Diagnosis not present

## 2015-06-13 DIAGNOSIS — M4806 Spinal stenosis, lumbar region: Secondary | ICD-10-CM | POA: Diagnosis not present

## 2015-06-13 DIAGNOSIS — M5136 Other intervertebral disc degeneration, lumbar region: Secondary | ICD-10-CM | POA: Diagnosis not present

## 2015-06-13 DIAGNOSIS — F419 Anxiety disorder, unspecified: Secondary | ICD-10-CM | POA: Diagnosis not present

## 2015-06-13 DIAGNOSIS — F329 Major depressive disorder, single episode, unspecified: Secondary | ICD-10-CM | POA: Diagnosis not present

## 2015-06-13 DIAGNOSIS — Z4889 Encounter for other specified surgical aftercare: Secondary | ICD-10-CM | POA: Diagnosis not present

## 2015-06-13 LAB — URINE CULTURE: Culture: 60000

## 2015-06-14 NOTE — Progress Notes (Signed)
ED Antimicrobial Stewardship Positive Culture Follow Up   Judith Chavez is an 69 y.o. female who presented to [facility] on 06/11/2015 with a chief complaint of  Chief Complaint  Patient presents with  . Fever  . Urinary Tract Infection    Recent Results (from the past 720 hour(s))  Surgical pcr screen     Status: None   Collection Time: 05/22/15 10:52 AM  Result Value Ref Range Status   MRSA, PCR NEGATIVE NEGATIVE Final   Staphylococcus aureus NEGATIVE NEGATIVE Final    Comment:        The Xpert SA Assay (FDA approved for NASAL specimens in patients over 71 years of age), is one component of a comprehensive surveillance program.  Test performance has been validated by [facility] for patients greater than or equal to 43 year old. It is not intended to diagnose infection nor to guide or monitor treatment.   Urine culture     Status: None   Collection Time: 06/11/15  8:24 PM  Result Value Ref Range Status   Specimen Description URINE, CLEAN CATCH  Final   Special Requests NONE  Final   Culture 60,000 COLONIES/ml PROTEUS MIRABILIS  Final   Report Status 06/13/2015 FINAL  Final   Organism ID, Bacteria PROTEUS MIRABILIS  Final      Susceptibility   Proteus mirabilis - MIC*    AMPICILLIN <=2 SENSITIVE Sensitive     CEFAZOLIN >=64 RESISTANT Resistant     CEFTRIAXONE <=1 SENSITIVE Sensitive     CIPROFLOXACIN <=0.25 SENSITIVE Sensitive     GENTAMICIN <=1 SENSITIVE Sensitive     IMIPENEM 1 SENSITIVE Sensitive     NITROFURANTOIN 128 RESISTANT Resistant     TRIMETH/SULFA <=20 SENSITIVE Sensitive     AMPICILLIN/SULBACTAM <=2 SENSITIVE Sensitive     PIP/TAZO <=4 SENSITIVE Sensitive     * 60,000 COLONIES/ml PROTEUS MIRABILIS   Patient with 60K proteus in urine culture. No d/c antibiotics. Pt presented with post-op fever and back pain. No urinary symptoms. No treatment is indicated.  ED Provider: Carlisle Cater, PA-C   [name] 06/14/2015, 8:54 AM Infectious Diseases  Pharmacist Phone# [phone]

## 2015-06-15 ENCOUNTER — Telehealth: Payer: Self-pay | Admitting: *Deleted

## 2015-06-15 DIAGNOSIS — M170 Bilateral primary osteoarthritis of knee: Secondary | ICD-10-CM | POA: Diagnosis not present

## 2015-06-15 DIAGNOSIS — F329 Major depressive disorder, single episode, unspecified: Secondary | ICD-10-CM | POA: Diagnosis not present

## 2015-06-15 DIAGNOSIS — Z4889 Encounter for other specified surgical aftercare: Secondary | ICD-10-CM | POA: Diagnosis not present

## 2015-06-15 DIAGNOSIS — M4806 Spinal stenosis, lumbar region: Secondary | ICD-10-CM | POA: Diagnosis not present

## 2015-06-15 DIAGNOSIS — F419 Anxiety disorder, unspecified: Secondary | ICD-10-CM | POA: Diagnosis not present

## 2015-06-15 DIAGNOSIS — M5136 Other intervertebral disc degeneration, lumbar region: Secondary | ICD-10-CM | POA: Diagnosis not present

## 2015-06-15 NOTE — ED Notes (Signed)
(+)  urine culture, advised at discharge to follow up with PCP, OK per Alvera Singh, PA

## 2015-06-16 DIAGNOSIS — M4806 Spinal stenosis, lumbar region: Secondary | ICD-10-CM | POA: Diagnosis not present

## 2015-06-16 DIAGNOSIS — Z4889 Encounter for other specified surgical aftercare: Secondary | ICD-10-CM | POA: Diagnosis not present

## 2015-06-16 DIAGNOSIS — M170 Bilateral primary osteoarthritis of knee: Secondary | ICD-10-CM | POA: Diagnosis not present

## 2015-06-16 DIAGNOSIS — F419 Anxiety disorder, unspecified: Secondary | ICD-10-CM | POA: Diagnosis not present

## 2015-06-16 DIAGNOSIS — F329 Major depressive disorder, single episode, unspecified: Secondary | ICD-10-CM | POA: Diagnosis not present

## 2015-06-16 DIAGNOSIS — M5136 Other intervertebral disc degeneration, lumbar region: Secondary | ICD-10-CM | POA: Diagnosis not present

## 2015-06-17 DIAGNOSIS — Z4889 Encounter for other specified surgical aftercare: Secondary | ICD-10-CM | POA: Diagnosis not present

## 2015-06-17 DIAGNOSIS — M5136 Other intervertebral disc degeneration, lumbar region: Secondary | ICD-10-CM | POA: Diagnosis not present

## 2015-06-17 DIAGNOSIS — F329 Major depressive disorder, single episode, unspecified: Secondary | ICD-10-CM | POA: Diagnosis not present

## 2015-06-17 DIAGNOSIS — M4806 Spinal stenosis, lumbar region: Secondary | ICD-10-CM | POA: Diagnosis not present

## 2015-06-17 DIAGNOSIS — M170 Bilateral primary osteoarthritis of knee: Secondary | ICD-10-CM | POA: Diagnosis not present

## 2015-06-17 DIAGNOSIS — F419 Anxiety disorder, unspecified: Secondary | ICD-10-CM | POA: Diagnosis not present

## 2015-06-18 ENCOUNTER — Other Ambulatory Visit (HOSPITAL_COMMUNITY): Payer: Self-pay | Admitting: Neurosurgery

## 2015-06-18 ENCOUNTER — Ambulatory Visit (HOSPITAL_COMMUNITY)
Admission: RE | Admit: 2015-06-18 | Discharge: 2015-06-18 | Disposition: A | Discharge status: Home / Self Care | Payer: Medicare Other | Source: Ambulatory Visit | Attending: Vascular Surgery | Admitting: Vascular Surgery

## 2015-06-18 DIAGNOSIS — F419 Anxiety disorder, unspecified: Secondary | ICD-10-CM | POA: Diagnosis not present

## 2015-06-18 DIAGNOSIS — M170 Bilateral primary osteoarthritis of knee: Secondary | ICD-10-CM | POA: Diagnosis not present

## 2015-06-18 DIAGNOSIS — R609 Edema, unspecified: Secondary | ICD-10-CM | POA: Insufficient documentation

## 2015-06-18 DIAGNOSIS — Z4889 Encounter for other specified surgical aftercare: Secondary | ICD-10-CM | POA: Diagnosis not present

## 2015-06-18 DIAGNOSIS — M5136 Other intervertebral disc degeneration, lumbar region: Secondary | ICD-10-CM | POA: Diagnosis not present

## 2015-06-18 DIAGNOSIS — M4806 Spinal stenosis, lumbar region: Secondary | ICD-10-CM | POA: Diagnosis not present

## 2015-06-18 DIAGNOSIS — F329 Major depressive disorder, single episode, unspecified: Secondary | ICD-10-CM | POA: Diagnosis not present

## 2015-06-19 DIAGNOSIS — M25572 Pain in left ankle and joints of left foot: Secondary | ICD-10-CM | POA: Diagnosis not present

## 2015-06-19 DIAGNOSIS — M19072 Primary osteoarthritis, left ankle and foot: Secondary | ICD-10-CM | POA: Diagnosis not present

## 2015-06-22 DIAGNOSIS — M170 Bilateral primary osteoarthritis of knee: Secondary | ICD-10-CM | POA: Diagnosis not present

## 2015-06-22 DIAGNOSIS — F419 Anxiety disorder, unspecified: Secondary | ICD-10-CM | POA: Diagnosis not present

## 2015-06-22 DIAGNOSIS — Z4889 Encounter for other specified surgical aftercare: Secondary | ICD-10-CM | POA: Diagnosis not present

## 2015-06-22 DIAGNOSIS — M4806 Spinal stenosis, lumbar region: Secondary | ICD-10-CM | POA: Diagnosis not present

## 2015-06-22 DIAGNOSIS — M5136 Other intervertebral disc degeneration, lumbar region: Secondary | ICD-10-CM | POA: Diagnosis not present

## 2015-06-22 DIAGNOSIS — F329 Major depressive disorder, single episode, unspecified: Secondary | ICD-10-CM | POA: Diagnosis not present

## 2015-06-23 DIAGNOSIS — M7672 Peroneal tendinitis, left leg: Secondary | ICD-10-CM | POA: Diagnosis not present

## 2015-06-24 DIAGNOSIS — M5136 Other intervertebral disc degeneration, lumbar region: Secondary | ICD-10-CM | POA: Diagnosis not present

## 2015-06-24 DIAGNOSIS — M4806 Spinal stenosis, lumbar region: Secondary | ICD-10-CM | POA: Diagnosis not present

## 2015-06-24 DIAGNOSIS — F329 Major depressive disorder, single episode, unspecified: Secondary | ICD-10-CM | POA: Diagnosis not present

## 2015-06-24 DIAGNOSIS — Z4889 Encounter for other specified surgical aftercare: Secondary | ICD-10-CM | POA: Diagnosis not present

## 2015-06-24 DIAGNOSIS — F419 Anxiety disorder, unspecified: Secondary | ICD-10-CM | POA: Diagnosis not present

## 2015-06-24 DIAGNOSIS — M170 Bilateral primary osteoarthritis of knee: Secondary | ICD-10-CM | POA: Diagnosis not present

## 2015-06-29 DIAGNOSIS — F419 Anxiety disorder, unspecified: Secondary | ICD-10-CM | POA: Diagnosis not present

## 2015-06-29 DIAGNOSIS — Z4889 Encounter for other specified surgical aftercare: Secondary | ICD-10-CM | POA: Diagnosis not present

## 2015-06-29 DIAGNOSIS — F329 Major depressive disorder, single episode, unspecified: Secondary | ICD-10-CM | POA: Diagnosis not present

## 2015-06-29 DIAGNOSIS — M170 Bilateral primary osteoarthritis of knee: Secondary | ICD-10-CM | POA: Diagnosis not present

## 2015-06-29 DIAGNOSIS — M5136 Other intervertebral disc degeneration, lumbar region: Secondary | ICD-10-CM | POA: Diagnosis not present

## 2015-06-29 DIAGNOSIS — M4806 Spinal stenosis, lumbar region: Secondary | ICD-10-CM | POA: Diagnosis not present

## 2015-07-02 DIAGNOSIS — Z4889 Encounter for other specified surgical aftercare: Secondary | ICD-10-CM | POA: Diagnosis not present

## 2015-07-02 DIAGNOSIS — F419 Anxiety disorder, unspecified: Secondary | ICD-10-CM | POA: Diagnosis not present

## 2015-07-02 DIAGNOSIS — M5136 Other intervertebral disc degeneration, lumbar region: Secondary | ICD-10-CM | POA: Diagnosis not present

## 2015-07-02 DIAGNOSIS — M170 Bilateral primary osteoarthritis of knee: Secondary | ICD-10-CM | POA: Diagnosis not present

## 2015-07-02 DIAGNOSIS — F329 Major depressive disorder, single episode, unspecified: Secondary | ICD-10-CM | POA: Diagnosis not present

## 2015-07-02 DIAGNOSIS — M4806 Spinal stenosis, lumbar region: Secondary | ICD-10-CM | POA: Diagnosis not present

## 2015-08-12 DIAGNOSIS — N952 Postmenopausal atrophic vaginitis: Secondary | ICD-10-CM | POA: Diagnosis not present

## 2015-08-12 DIAGNOSIS — F322 Major depressive disorder, single episode, severe without psychotic features: Secondary | ICD-10-CM | POA: Diagnosis not present

## 2015-08-12 DIAGNOSIS — M549 Dorsalgia, unspecified: Secondary | ICD-10-CM | POA: Diagnosis not present

## 2015-08-12 DIAGNOSIS — M8580 Other specified disorders of bone density and structure, unspecified site: Secondary | ICD-10-CM | POA: Diagnosis not present

## 2015-08-12 DIAGNOSIS — Z13220 Encounter for screening for lipoid disorders: Secondary | ICD-10-CM | POA: Diagnosis not present

## 2015-08-12 DIAGNOSIS — Z0000 Encounter for general adult medical examination without abnormal findings: Secondary | ICD-10-CM | POA: Diagnosis not present

## 2015-08-12 DIAGNOSIS — Z79899 Other long term (current) drug therapy: Secondary | ICD-10-CM | POA: Diagnosis not present

## 2015-08-12 DIAGNOSIS — M85852 Other specified disorders of bone density and structure, left thigh: Secondary | ICD-10-CM | POA: Diagnosis not present

## 2015-08-12 DIAGNOSIS — A6000 Herpesviral infection of urogenital system, unspecified: Secondary | ICD-10-CM | POA: Diagnosis not present

## 2015-08-12 DIAGNOSIS — J301 Allergic rhinitis due to pollen: Secondary | ICD-10-CM | POA: Diagnosis not present

## 2015-08-12 DIAGNOSIS — M1990 Unspecified osteoarthritis, unspecified site: Secondary | ICD-10-CM | POA: Diagnosis not present

## 2015-08-12 DIAGNOSIS — Z1211 Encounter for screening for malignant neoplasm of colon: Secondary | ICD-10-CM | POA: Diagnosis not present

## 2015-08-12 DIAGNOSIS — Z23 Encounter for immunization: Secondary | ICD-10-CM | POA: Diagnosis not present

## 2015-08-18 DIAGNOSIS — M8589 Other specified disorders of bone density and structure, multiple sites: Secondary | ICD-10-CM | POA: Diagnosis not present

## 2015-08-18 DIAGNOSIS — M859 Disorder of bone density and structure, unspecified: Secondary | ICD-10-CM | POA: Diagnosis not present

## 2015-08-21 DIAGNOSIS — M549 Dorsalgia, unspecified: Secondary | ICD-10-CM | POA: Diagnosis not present

## 2015-08-21 DIAGNOSIS — R531 Weakness: Secondary | ICD-10-CM | POA: Diagnosis not present

## 2015-08-21 DIAGNOSIS — M545 Low back pain: Secondary | ICD-10-CM | POA: Diagnosis not present

## 2015-08-25 DIAGNOSIS — M549 Dorsalgia, unspecified: Secondary | ICD-10-CM | POA: Diagnosis not present

## 2015-08-25 DIAGNOSIS — R531 Weakness: Secondary | ICD-10-CM | POA: Diagnosis not present

## 2015-08-25 DIAGNOSIS — M545 Low back pain: Secondary | ICD-10-CM | POA: Diagnosis not present

## 2015-08-26 DIAGNOSIS — R7303 Prediabetes: Secondary | ICD-10-CM | POA: Diagnosis not present

## 2015-08-26 DIAGNOSIS — R7309 Other abnormal glucose: Secondary | ICD-10-CM | POA: Diagnosis not present

## 2015-09-01 DIAGNOSIS — M545 Low back pain: Secondary | ICD-10-CM | POA: Diagnosis not present

## 2015-09-01 DIAGNOSIS — R531 Weakness: Secondary | ICD-10-CM | POA: Diagnosis not present

## 2015-09-01 DIAGNOSIS — M549 Dorsalgia, unspecified: Secondary | ICD-10-CM | POA: Diagnosis not present

## 2015-09-03 DIAGNOSIS — M545 Low back pain: Secondary | ICD-10-CM | POA: Diagnosis not present

## 2015-09-03 DIAGNOSIS — M549 Dorsalgia, unspecified: Secondary | ICD-10-CM | POA: Diagnosis not present

## 2015-09-03 DIAGNOSIS — R531 Weakness: Secondary | ICD-10-CM | POA: Diagnosis not present

## 2015-09-07 DIAGNOSIS — R531 Weakness: Secondary | ICD-10-CM | POA: Diagnosis not present

## 2015-09-07 DIAGNOSIS — M545 Low back pain: Secondary | ICD-10-CM | POA: Diagnosis not present

## 2015-09-07 DIAGNOSIS — M549 Dorsalgia, unspecified: Secondary | ICD-10-CM | POA: Diagnosis not present

## 2015-09-10 ENCOUNTER — Ambulatory Visit: Payer: Self-pay

## 2015-09-11 DIAGNOSIS — M549 Dorsalgia, unspecified: Secondary | ICD-10-CM | POA: Diagnosis not present

## 2015-09-11 DIAGNOSIS — M545 Low back pain: Secondary | ICD-10-CM | POA: Diagnosis not present

## 2015-09-11 DIAGNOSIS — R531 Weakness: Secondary | ICD-10-CM | POA: Diagnosis not present

## 2015-09-15 ENCOUNTER — Ambulatory Visit: Payer: Self-pay

## 2015-09-15 DIAGNOSIS — M549 Dorsalgia, unspecified: Secondary | ICD-10-CM | POA: Diagnosis not present

## 2015-09-15 DIAGNOSIS — M545 Low back pain: Secondary | ICD-10-CM | POA: Diagnosis not present

## 2015-09-15 DIAGNOSIS — R531 Weakness: Secondary | ICD-10-CM | POA: Diagnosis not present

## 2015-09-17 DIAGNOSIS — M5126 Other intervertebral disc displacement, lumbar region: Secondary | ICD-10-CM | POA: Diagnosis not present

## 2015-09-17 DIAGNOSIS — M5137 Other intervertebral disc degeneration, lumbosacral region: Secondary | ICD-10-CM | POA: Diagnosis not present

## 2015-09-23 ENCOUNTER — Encounter: Payer: Self-pay | Admitting: *Deleted

## 2015-09-23 ENCOUNTER — Encounter: Payer: Medicare Other | Attending: Family Medicine | Admitting: *Deleted

## 2015-09-23 DIAGNOSIS — E118 Type 2 diabetes mellitus with unspecified complications: Secondary | ICD-10-CM | POA: Insufficient documentation

## 2015-09-23 DIAGNOSIS — E119 Type 2 diabetes mellitus without complications: Secondary | ICD-10-CM

## 2015-09-23 NOTE — Patient Instructions (Signed)
Plan:  Aim for 2-3 Carb Choices per meal (30-45 grams) +/- 1 either way  Aim for 0-15 Carbs per snack if hungry  Include protein in moderation with your meals and snacks Consider reading food labels for Total Carbohydrate and Fat Grams of foods Consider  increasing your activity level by riding your bike/walking for 30 minutes daily as tolerated Consider checking BG at alternate times per day to include fasting and 2 hours after a meal as directed by MD  Continue taking medication as directed by MD  Consider Special K Protein Cereal

## 2015-09-23 NOTE — Progress Notes (Signed)
Diabetes Self-Management Education  Visit Type: First/Initial  Appt. Start Time: 1530 Appt. End Time: 1700  09/23/2015  [name], identified by name and date of birth, is a 70 y.o. female with a diagnosis of Diabetes: Type 2. Judith Chavez presents with her husband Judith Chavez r/t new DX of T2DM. In review of her records I find an A1c 2011 of 6.1%. This helped her understand that this did not "happen over night".   Blood Glucose Monitoring Instruction. Called Dr. Brigitte Pulse for order for testing supplies to go to [facility] [place].  Meter Provided: One Touch Verio Flex  Lot #: [id number] Expiration Date: H301410 Glucose reading: 3hpp 181mg /dl     ASSESSMENT  There were no vitals taken for this visit. There is no weight on file to calculate BMI.      Diabetes Self-Management Education - 09/23/15 1722    Visit Information   Visit Type First/Initial   Initial Visit   Diabetes Type Type 2   Are you currently following a meal plan? Yes   What type of meal plan do you follow? vegetarian   Are you taking your medications as prescribed? Yes   Date Diagnosed 08/2015   Health Coping   How would you rate your overall health? Good   Psychosocial Assessment   Patient Belief/Attitude about Diabetes Denial   Self-care barriers None   Self-management support Doctor's office;Friends;Family;CDE visits   Other persons present Patient;Spouse/SO  Husband Judith Chavez   Patient Concerns Nutrition/Meal planning;Medication;Monitoring;Healthy Lifestyle;Glycemic Control   Special Needs None   Preferred Learning Style No preference indicated   Learning Readiness Change in progress   How often do you need to have someone help you when you read instructions, pamphlets, or other written materials from your doctor or pharmacy? 2 - Rarely   What is the last grade level you completed in school? 4 yrs college   Complications   Last HgB A1C per patient/outside source 8.4 %   How often do you check your blood sugar?  1-2 times/day  start testing today   Postprandial Blood glucose range (mg/dL) 180-200  3 hpp today 181mg /dl   Have you had a dilated eye exam in the past 12 months? Yes   Have you had a dental exam in the past 12 months? Yes   Are you checking your feet? No   Dietary Intake   Breakfast corn flakes/ (whole milk) almond milk,...egg white, tomatoes, mushrooms, cheese, slice whole wheat toast/oatmeal   Snack (morning) fruit & nuts   Lunch salmon, salad, vegetables, avacado, strawberries   Snack (afternoon) apple & peanut butter    Dinner lima beans, salmon, white rice   Beverage(s) water, coffee, milk   Exercise   Exercise Type ADL's  recent spinal fusion 05/2015 - activity restriction - just released   Patient Education   Previous Diabetes Education No   Disease state  Definition of diabetes, type 1 and 2, and the diagnosis of diabetes;Factors that contribute to the development of diabetes   Nutrition management  Role of diet in the treatment of diabetes and the relationship between the three main macronutrients and blood glucose level;Food label reading, portion sizes and measuring food.;Reviewed blood glucose goals for pre and post meals and how to evaluate the patients' food intake on their blood glucose level.;Meal options for control of blood glucose level and chronic complications.   Physical activity and exercise  Role of exercise on diabetes management, blood pressure control and cardiac health.   Monitoring  Taught/evaluated SMBG meter.;Purpose and frequency of SMBG.;Taught/discussed recording of test results and interpretation of SMBG.;Identified appropriate SMBG and/or A1C goals.;Other (comment)  Disp: One Touch Verio Flex Lot# [id number], Exp [id number]   Chronic complications Relationship between chronic complications and blood glucose control;Retinopathy and reason for yearly dilated eye exams;Lipid levels, blood glucose control and heart disease;Reviewed with patient heart disease,  higher risk of, and prevention   Psychosocial adjustment Role of stress on diabetes;Travel strategies;Identified and addressed patients feelings and concerns about diabetes   Individualized Goals (developed by patient)   Nutrition General guidelines for healthy choices and portions discussed   Physical Activity Exercise 3-5 times per week;30 minutes per day   Monitoring  test my blood glucose as discussed;test blood glucose pre and post meals as discussed   Reducing Risk increase portions of nuts and seeds   Outcomes   Expected Outcomes Demonstrated interest in learning. Expect positive outcomes   Future DMSE PRN   Program Status Completed      Individualized Plan for Diabetes Self-Management Training:   Learning Objective:  Patient will have a greater understanding of diabetes self-management. Patient education plan is to attend individual and/or group sessions per assessed needs and concerns.   Plan:   Patient Instructions  Plan:  Aim for 2-3 Carb Choices per meal (30-45 grams) +/- 1 either way  Aim for 0-15 Carbs per snack if hungry  Include protein in moderation with your meals and snacks Consider reading food labels for Total Carbohydrate and Fat Grams of foods Consider  increasing your activity level by riding your bike/walking for 30 minutes daily as tolerated Consider checking BG at alternate times per day to include fasting and 2 hours after a meal as directed by MD  Continue taking medication as directed by MD  Consider Special K Protein Cereal  Expected Outcomes:  Demonstrated interest in learning. Expect positive outcomes  Education material provided: Living Well with Diabetes, A1C conversion sheet, Meal plan card and My Plate  If problems or questions, patient to contact team via:  Phone and Email  Future DSME appointment: PRN

## 2015-10-06 ENCOUNTER — Ambulatory Visit: Payer: Self-pay

## 2015-10-06 DIAGNOSIS — E559 Vitamin D deficiency, unspecified: Secondary | ICD-10-CM | POA: Diagnosis not present

## 2015-12-03 DIAGNOSIS — E559 Vitamin D deficiency, unspecified: Secondary | ICD-10-CM | POA: Diagnosis not present

## 2015-12-03 DIAGNOSIS — Z7984 Long term (current) use of oral hypoglycemic drugs: Secondary | ICD-10-CM | POA: Diagnosis not present

## 2015-12-03 DIAGNOSIS — E1165 Type 2 diabetes mellitus with hyperglycemia: Secondary | ICD-10-CM | POA: Diagnosis not present

## 2015-12-11 DIAGNOSIS — H52223 Regular astigmatism, bilateral: Secondary | ICD-10-CM | POA: Diagnosis not present

## 2015-12-11 DIAGNOSIS — E119 Type 2 diabetes mellitus without complications: Secondary | ICD-10-CM | POA: Diagnosis not present

## 2015-12-11 DIAGNOSIS — H524 Presbyopia: Secondary | ICD-10-CM | POA: Diagnosis not present

## 2015-12-11 DIAGNOSIS — H5203 Hypermetropia, bilateral: Secondary | ICD-10-CM | POA: Diagnosis not present

## 2015-12-15 DIAGNOSIS — M5137 Other intervertebral disc degeneration, lumbosacral region: Secondary | ICD-10-CM | POA: Diagnosis not present

## 2016-01-14 ENCOUNTER — Ambulatory Visit (INDEPENDENT_AMBULATORY_CARE_PROVIDER_SITE_OTHER): Payer: Medicare Other | Admitting: Podiatry

## 2016-01-14 ENCOUNTER — Ambulatory Visit (INDEPENDENT_AMBULATORY_CARE_PROVIDER_SITE_OTHER): Payer: Medicare Other

## 2016-01-14 ENCOUNTER — Encounter: Payer: Self-pay | Admitting: Podiatry

## 2016-01-14 VITALS — BP 133/78 | HR 72 | Resp 16 | Ht 62.0 in | Wt 150.0 lb

## 2016-01-14 DIAGNOSIS — M779 Enthesopathy, unspecified: Secondary | ICD-10-CM

## 2016-01-14 DIAGNOSIS — M722 Plantar fascial fibromatosis: Secondary | ICD-10-CM | POA: Diagnosis not present

## 2016-01-14 DIAGNOSIS — M79673 Pain in unspecified foot: Secondary | ICD-10-CM

## 2016-01-14 MED ORDER — TRIAMCINOLONE ACETONIDE 10 MG/ML IJ SUSP
10.0000 mg | Freq: Once | INTRAMUSCULAR | Status: AC
  Administered 2016-01-14: 10 mg

## 2016-01-14 NOTE — Progress Notes (Signed)
   Subjective:    Patient ID: Judith Chavez, female    DOB: 1946-03-22, 70 y.o.   MRN: [id number]  HPI Chief Complaint  Patient presents with  . Foot Pain    Bilateral; Right foot-Plantar & back of heel; Left foot-Back of heel; great toe-joint; pt stated, "feet throb at night time; feet hurt when walking"; pt diabetic type 2; sugar=131 this am; A1C=7.5; x2 months      Review of Systems  HENT: Positive for sinus pressure.   All other systems reviewed and are negative.      Objective:   Physical Exam        Assessment & Plan:

## 2016-01-14 NOTE — Patient Instructions (Addendum)

## 2016-01-15 NOTE — Progress Notes (Signed)
Subjective:     Patient ID: Judith Chavez, female   DOB: [birth year], 70 y.o.   MRN: [id number]  HPI patient presents with inflammatory changes around the first MPJ plantar left and the right plantar heel and states that they've been going on for a few months and that the forefoot left is what started first. States that she does not remember specific injury   Review of Systems  All other systems reviewed and are negative.      Objective:   Physical Exam  Constitutional: She is oriented to person, place, and time.  Cardiovascular: Intact distal pulses.   Musculoskeletal: Normal range of motion.  Neurological: She is oriented to person, place, and time.  Skin: Skin is warm.  Nursing note and vitals reviewed.  neurovascular status intact muscle strength was adequate with patient noted to have exquisite discomfort left plantar first metatarsal with minimal edema associated with it and pain in the plantar heel right with mild Achilles tendinitis bilateral that appears to be more compensatory. Patient's noted to have good digital perfusion and is well oriented 3     Assessment:     Inflammatory changes consistent with sesamoiditis left and plantar fasciitis right with inflammation noted around the medial band. Compensatory Achilles tendinitis which should respond as the others get better and no indication of fracture at this time    Plan:     H&P and conditions reviewed and x-rays reviewed with patient. Plantar capsule sesamoidal injection accomplished left and plantar fascial injection right with application of fascially brace right accomplished. Advised on reduced activity supportive shoe gear usage and reappoint in 2 weeks  X-rays taken were negative for fracture or other bone pathology with spur formation noted but no indication of stress fracture

## 2016-02-04 ENCOUNTER — Ambulatory Visit: Payer: Medicare Other | Admitting: Podiatry

## 2016-02-11 ENCOUNTER — Ambulatory Visit (INDEPENDENT_AMBULATORY_CARE_PROVIDER_SITE_OTHER): Payer: Medicare Other | Admitting: Podiatry

## 2016-02-11 ENCOUNTER — Encounter: Payer: Self-pay | Admitting: Podiatry

## 2016-02-11 DIAGNOSIS — M722 Plantar fascial fibromatosis: Secondary | ICD-10-CM | POA: Diagnosis not present

## 2016-02-11 DIAGNOSIS — M779 Enthesopathy, unspecified: Secondary | ICD-10-CM

## 2016-02-14 NOTE — Progress Notes (Signed)
Subjective:     Patient ID: Judith Chavez, female   DOB: Nov 22, 1945, 70 y.o.   MRN: [id number]  HPI patient presents stating that she still has pain in her feet and burning and she's not sure what may be causing   Review of Systems     Objective:   Physical Exam Neurovascular status unchanged with patient noted to have moderate burning in both feet localized in nature with no indications of advanced neuropathic changes. Patient does have diabetes which is certainly a complicating factor    Assessment:     Inflammatory condition with possibility of diabetes been contributory to the overall problem    Plan:     H&P condition reviewed and recommended at this time that we consider orthotics to try to provide for better plantar support and take stress off her feet. Patient is scanned for custom orthotics to reduce pressure and lesion formation and will be seen back to recheck when ready

## 2016-02-25 DIAGNOSIS — J019 Acute sinusitis, unspecified: Secondary | ICD-10-CM | POA: Diagnosis not present

## 2016-03-09 DIAGNOSIS — Z7984 Long term (current) use of oral hypoglycemic drugs: Secondary | ICD-10-CM | POA: Diagnosis not present

## 2016-03-09 DIAGNOSIS — Z23 Encounter for immunization: Secondary | ICD-10-CM | POA: Diagnosis not present

## 2016-03-09 DIAGNOSIS — G5601 Carpal tunnel syndrome, right upper limb: Secondary | ICD-10-CM | POA: Diagnosis not present

## 2016-03-09 DIAGNOSIS — E1165 Type 2 diabetes mellitus with hyperglycemia: Secondary | ICD-10-CM | POA: Diagnosis not present

## 2016-03-10 ENCOUNTER — Encounter: Payer: Self-pay | Admitting: Podiatry

## 2016-03-10 ENCOUNTER — Ambulatory Visit (INDEPENDENT_AMBULATORY_CARE_PROVIDER_SITE_OTHER): Payer: Medicare Other | Admitting: Podiatry

## 2016-03-10 DIAGNOSIS — M722 Plantar fascial fibromatosis: Secondary | ICD-10-CM

## 2016-03-10 NOTE — Progress Notes (Signed)
Subjective:     Patient ID: Judith Chavez, female   DOB: 1945-09-12, 70 y.o.   MRN: [id number]  HPI patient states that the feet feel somewhat better but the orthotic feels like the left one is not lifting   Review of Systems     Objective:   Physical Exam Neurovascular status unchanged with patient noted to have moderate deformity with tendinitis-like symptoms and long-term pain in feet    Assessment:     Inflammatory condition that were tried and utilize orthotics on    Plan:     Physical therapy anti-inflammatories and went ahead and dispensed orthotics and decided at heel lift left which we'll work on at this time. Patient be seen back when ready

## 2016-03-10 NOTE — Patient Instructions (Signed)

## 2016-03-23 ENCOUNTER — Telehealth: Payer: Self-pay | Admitting: Podiatry

## 2016-03-23 NOTE — Telephone Encounter (Signed)
Left message for patient that adjusted Orthotics are ready to be picked up

## 2016-04-05 DIAGNOSIS — M25512 Pain in left shoulder: Secondary | ICD-10-CM | POA: Diagnosis not present

## 2016-04-05 DIAGNOSIS — M25511 Pain in right shoulder: Secondary | ICD-10-CM | POA: Diagnosis not present

## 2016-05-02 ENCOUNTER — Other Ambulatory Visit: Payer: Self-pay | Admitting: Family Medicine

## 2016-05-02 DIAGNOSIS — Z1231 Encounter for screening mammogram for malignant neoplasm of breast: Secondary | ICD-10-CM

## 2016-05-03 ENCOUNTER — Other Ambulatory Visit: Payer: Self-pay | Admitting: Neurosurgery

## 2016-05-03 DIAGNOSIS — M5137 Other intervertebral disc degeneration, lumbosacral region: Secondary | ICD-10-CM | POA: Diagnosis not present

## 2016-05-03 DIAGNOSIS — M5136 Other intervertebral disc degeneration, lumbar region: Secondary | ICD-10-CM

## 2016-05-03 DIAGNOSIS — M5023 Other cervical disc displacement, cervicothoracic region: Secondary | ICD-10-CM | POA: Diagnosis not present

## 2016-05-03 DIAGNOSIS — M5416 Radiculopathy, lumbar region: Secondary | ICD-10-CM | POA: Diagnosis not present

## 2016-05-03 DIAGNOSIS — M51369 Other intervertebral disc degeneration, lumbar region without mention of lumbar back pain or lower extremity pain: Secondary | ICD-10-CM

## 2016-05-03 DIAGNOSIS — M542 Cervicalgia: Secondary | ICD-10-CM | POA: Diagnosis not present

## 2016-05-15 ENCOUNTER — Ambulatory Visit
Admission: RE | Admit: 2016-05-15 | Discharge: 2016-05-15 | Disposition: A | Discharge status: Home / Self Care | Payer: Medicare Other | Source: Ambulatory Visit | Attending: Neurosurgery | Admitting: Neurosurgery

## 2016-05-15 DIAGNOSIS — M5136 Other intervertebral disc degeneration, lumbar region: Secondary | ICD-10-CM

## 2016-05-15 DIAGNOSIS — M50221 Other cervical disc displacement at C4-C5 level: Secondary | ICD-10-CM | POA: Diagnosis not present

## 2016-05-15 DIAGNOSIS — M5023 Other cervical disc displacement, cervicothoracic region: Secondary | ICD-10-CM

## 2016-05-15 DIAGNOSIS — M50222 Other cervical disc displacement at C5-C6 level: Secondary | ICD-10-CM | POA: Diagnosis not present

## 2016-05-15 DIAGNOSIS — M5126 Other intervertebral disc displacement, lumbar region: Secondary | ICD-10-CM | POA: Diagnosis not present

## 2016-05-15 DIAGNOSIS — M51369 Other intervertebral disc degeneration, lumbar region without mention of lumbar back pain or lower extremity pain: Secondary | ICD-10-CM

## 2016-05-24 ENCOUNTER — Ambulatory Visit
Admission: RE | Admit: 2016-05-24 | Discharge: 2016-05-24 | Disposition: A | Discharge status: Home / Self Care | Payer: Medicare Other | Source: Ambulatory Visit | Attending: Family Medicine | Admitting: Family Medicine

## 2016-05-24 DIAGNOSIS — Z1231 Encounter for screening mammogram for malignant neoplasm of breast: Secondary | ICD-10-CM | POA: Diagnosis not present

## 2016-05-24 DIAGNOSIS — M542 Cervicalgia: Secondary | ICD-10-CM | POA: Diagnosis not present

## 2016-05-24 DIAGNOSIS — M5136 Other intervertebral disc degeneration, lumbar region: Secondary | ICD-10-CM | POA: Diagnosis not present

## 2016-06-13 DIAGNOSIS — J0100 Acute maxillary sinusitis, unspecified: Secondary | ICD-10-CM | POA: Diagnosis not present

## 2016-06-13 DIAGNOSIS — R05 Cough: Secondary | ICD-10-CM | POA: Diagnosis not present

## 2016-07-18 DIAGNOSIS — E119 Type 2 diabetes mellitus without complications: Secondary | ICD-10-CM | POA: Diagnosis not present

## 2016-07-18 DIAGNOSIS — L853 Xerosis cutis: Secondary | ICD-10-CM | POA: Diagnosis not present

## 2016-07-18 DIAGNOSIS — R229 Localized swelling, mass and lump, unspecified: Secondary | ICD-10-CM | POA: Diagnosis not present

## 2016-07-18 DIAGNOSIS — R6889 Other general symptoms and signs: Secondary | ICD-10-CM | POA: Diagnosis not present

## 2016-07-18 DIAGNOSIS — F322 Major depressive disorder, single episode, severe without psychotic features: Secondary | ICD-10-CM | POA: Diagnosis not present

## 2016-07-22 ENCOUNTER — Ambulatory Visit (INDEPENDENT_AMBULATORY_CARE_PROVIDER_SITE_OTHER): Payer: Medicare Other

## 2016-07-22 ENCOUNTER — Ambulatory Visit (INDEPENDENT_AMBULATORY_CARE_PROVIDER_SITE_OTHER): Payer: Medicare Other | Admitting: Orthopaedic Surgery

## 2016-07-22 ENCOUNTER — Encounter (INDEPENDENT_AMBULATORY_CARE_PROVIDER_SITE_OTHER): Payer: Self-pay | Admitting: Orthopaedic Surgery

## 2016-07-22 DIAGNOSIS — M25572 Pain in left ankle and joints of left foot: Secondary | ICD-10-CM | POA: Diagnosis not present

## 2016-07-22 MED ORDER — DICLOFENAC SODIUM 1 % TD GEL: 2.0000 g | Freq: Four times a day (QID) | TRANSDERMAL | 5 refills | Status: DC

## 2016-07-22 NOTE — Progress Notes (Signed)
Office Visit Note   Patient: Judith Chavez           Date of Birth: 11-27-1945           MRN: [id number] Visit Date: 07/22/2016              Requested by: Mayra Neer, MD [address] [place],  [address] PCP: Mayra Neer, MD   Assessment & Plan: Visit Diagnoses:  1. Pain in left ankle and joints of left foot     Plan: Patient likely has inflammation versus chronic tear of her ATFL ligament. I recommend home exercise program for ankle strengthening and icing, Voltaren gel. Follow-up with me as needed.  Follow-Up Instructions: No Follow-up on file.   Orders:  Orders Placed This Encounter  Procedures  . XR Ankle Complete Left   Meds ordered this encounter  Medications  . diclofenac sodium (VOLTAREN) 1 % GEL    Sig: Apply 2 g topically 4 (four) times daily.    Dispense:  1 Tube    Refill:  5      Procedures: No procedures performed   Clinical Data: No additional findings.   Subjective: Chief Complaint  Patient presents with  . Left Ankle - Pain    Patient is a 71 year old female who comes in with left ankle pain and swelling for about a month. She denies any injuries. She has constant 6 out of 10 pain of the anterolateral aspect of the ankle that is sharp and worse with weightbearing that does not radiate. She's been trying to elevate the leg which has helped the swelling.    Review of Systems Complete review of systems is negative except for history of present illness  Objective: Vital Signs: There were no vitals taken for this visit.  Physical Exam Well-developed nourished acute distress alert and 3 Ortho Exam Exam of the left ankle shows moderate swelling of the anterolateral region of the ankle over the ATFL ligament. Negative anterior drawer of the ankle. Exam is essentially benign with mild nonspecific tenderness throughout the lateral aspect of her ankle. Specialty Comments:  No specialty comments  available.  Imaging: Xr Ankle Complete Left  Result Date: 07/22/2016 No acute findings or structural abnormalities    PMFS History: Patient Active Problem List   Diagnosis Date Noted  . Spondylolisthesis at L3-L4 level 06/01/2015   Past Medical History:  Diagnosis Date  . Anxiety   . Arthritis    knees  . Depression   . Diabetes mellitus without complication (HCC)    Type 2  . Headache    migraines  . History of pneumonia   . Numbness    "left foot"   . Poor circulation     No family history on file.  Past Surgical History:  Procedure Laterality Date  . ABCESS DRAINAGE     "after cesarean- in hospital for 3 months"  . ABDOMINAL HYSTERECTOMY    . CESAREAN SECTION  1970  . CHOLECYSTECTOMY    . COLONOSCOPY    . EYE SURGERY Right   . MENISCUS REPAIR Bilateral   . SPINE SURGERY     Social History   Occupational History  . Not on file.   Social History Main Topics  . Smoking status: Former Smoker    Quit date: 07/11/1986  . Smokeless tobacco: Not on file  . Alcohol use Yes     Comment: occassionally  . Drug use: No  . Sexual activity: Not on file

## 2016-08-26 DIAGNOSIS — M542 Cervicalgia: Secondary | ICD-10-CM | POA: Diagnosis not present

## 2016-08-26 DIAGNOSIS — M5023 Other cervical disc displacement, cervicothoracic region: Secondary | ICD-10-CM | POA: Diagnosis not present

## 2016-09-13 DIAGNOSIS — M5023 Other cervical disc displacement, cervicothoracic region: Secondary | ICD-10-CM | POA: Diagnosis not present

## 2016-10-04 DIAGNOSIS — M5023 Other cervical disc displacement, cervicothoracic region: Secondary | ICD-10-CM | POA: Diagnosis not present

## 2016-10-24 ENCOUNTER — Ambulatory Visit (INDEPENDENT_AMBULATORY_CARE_PROVIDER_SITE_OTHER): Payer: Medicare Other | Admitting: Orthopaedic Surgery

## 2016-10-24 ENCOUNTER — Encounter (INDEPENDENT_AMBULATORY_CARE_PROVIDER_SITE_OTHER): Payer: Self-pay | Admitting: Orthopaedic Surgery

## 2016-10-24 DIAGNOSIS — M1711 Unilateral primary osteoarthritis, right knee: Secondary | ICD-10-CM

## 2016-10-24 DIAGNOSIS — M1712 Unilateral primary osteoarthritis, left knee: Secondary | ICD-10-CM

## 2016-10-24 MED ORDER — LIDOCAINE HCL 1 % IJ SOLN
2.0000 mL | INTRAMUSCULAR | Status: AC | PRN
  Administered 2016-10-24: 2 mL

## 2016-10-24 MED ORDER — BUPIVACAINE HCL 0.5 % IJ SOLN
2.0000 mL | INTRAMUSCULAR | Status: AC | PRN
  Administered 2016-10-24: 2 mL via INTRA_ARTICULAR

## 2016-10-24 MED ORDER — METHYLPREDNISOLONE ACETATE 40 MG/ML IJ SUSP
40.0000 mg | INTRAMUSCULAR | Status: AC | PRN
  Administered 2016-10-24: 40 mg via INTRA_ARTICULAR

## 2016-10-24 NOTE — Progress Notes (Signed)
Office Visit Note   Patient: Judith Chavez           Date of Birth: 10/06/1945           MRN: [id number] Visit Date: 10/24/2016              Requested by: Mayra Neer, MD [address] [place], [place] [address] PCP: Mayra Neer, MD   Assessment & Plan: Visit Diagnoses:  1. Unilateral primary osteoarthritis, left knee   2. Unilateral primary osteoarthritis, right knee     Plan: Bilateral knee injections were performed today patient tolerates well see her back as needed. Questions encouraged and answered.  Follow-Up Instructions: Return if symptoms worsen or fail to improve.   Orders:  No orders of the defined types were placed in this encounter.  No orders of the defined types were placed in this encounter.     Procedures: Large Joint Inj Date/Time: 10/24/2016 9:05 AM Performed by: Leandrew Koyanagi Authorized by: Leandrew Koyanagi   Consent Given by:  Patient Timeout: prior to procedure the correct patient, procedure, and site was verified   Indications:  Pain Location:  Knee Site:  R knee Prep: patient was prepped and draped in usual sterile fashion   Needle Size:  22 G Ultrasound Guidance: No   Fluoroscopic Guidance: No   Arthrogram: No   Patient tolerance:  Patient tolerated the procedure well with no immediate complications Large Joint Inj Date/Time: 10/24/2016 9:05 AM Performed by: Leandrew Koyanagi Authorized by: Leandrew Koyanagi   Consent Given by:  Patient Timeout: prior to procedure the correct patient, procedure, and site was verified   Indications:  Pain Location:  Knee Site:  R knee Prep: patient was prepped and draped in usual sterile fashion   Needle Size:  22 G Ultrasound Guidance: No   Fluoroscopic Guidance: No   Arthrogram: No   Medications:  2 mL lidocaine 1 %; 2 mL bupivacaine 0.5 %; 40 mg methylPREDNISolone acetate 40 MG/ML Patient tolerance:  Patient tolerated the procedure well with no immediate complications     Clinical  Data: No additional findings.   Subjective: Chief Complaint  Patient presents with  . Right Knee - Pain    Ms. Elenes comes back today for bilateral knee pain. She has had previous cortisone injections with good relief with Dr. Marlou Sa. This was done over 2 years ago. She states that she would like another injection today. She denies any changes in medical history or new numbness or symptoms.    Review of Systems   Objective: Vital Signs: There were no vitals taken for this visit.  Physical Exam  Ortho Exam Bilateral exam is unchanged. She has no joint effusion. Good range of motion of the knees. Specialty Comments:  No specialty comments available.  Imaging: No results found.   PMFS History: Patient Active Problem List   Diagnosis Date Noted  . Spondylolisthesis at L3-L4 level 06/01/2015   Past Medical History:  Diagnosis Date  . Anxiety   . Arthritis    knees  . Depression   . Diabetes mellitus without complication (HCC)    Type 2  . Headache    migraines  . History of pneumonia   . Numbness    "left foot"   . Poor circulation     No family history on file.  Past Surgical History:  Procedure Laterality Date  . ABCESS DRAINAGE     "after cesarean- in hospital for 3 months"  .  ABDOMINAL HYSTERECTOMY    . CESAREAN SECTION  1970  . CHOLECYSTECTOMY    . COLONOSCOPY    . EYE SURGERY Right   . MENISCUS REPAIR Bilateral   . SPINE SURGERY     Social History   Occupational History  . Not on file.   Social History Main Topics  . Smoking status: Never Smoker  . Smokeless tobacco: Never Used  . Alcohol use Yes     Comment: occassionally  . Drug use: No  . Sexual activity: Not on file

## 2016-11-02 DIAGNOSIS — M5023 Other cervical disc displacement, cervicothoracic region: Secondary | ICD-10-CM | POA: Diagnosis not present

## 2016-11-02 DIAGNOSIS — R030 Elevated blood-pressure reading, without diagnosis of hypertension: Secondary | ICD-10-CM | POA: Diagnosis not present

## 2016-11-02 DIAGNOSIS — Z6830 Body mass index (BMI) 30.0-30.9, adult: Secondary | ICD-10-CM | POA: Diagnosis not present

## 2016-11-02 DIAGNOSIS — M542 Cervicalgia: Secondary | ICD-10-CM | POA: Diagnosis not present

## 2016-12-14 DIAGNOSIS — R030 Elevated blood-pressure reading, without diagnosis of hypertension: Secondary | ICD-10-CM | POA: Diagnosis not present

## 2016-12-14 DIAGNOSIS — M542 Cervicalgia: Secondary | ICD-10-CM | POA: Diagnosis not present

## 2016-12-14 DIAGNOSIS — Z6830 Body mass index (BMI) 30.0-30.9, adult: Secondary | ICD-10-CM | POA: Diagnosis not present

## 2016-12-26 DIAGNOSIS — M546 Pain in thoracic spine: Secondary | ICD-10-CM | POA: Diagnosis not present

## 2017-01-13 ENCOUNTER — Encounter (HOSPITAL_COMMUNITY): Payer: Self-pay | Admitting: Emergency Medicine

## 2017-01-13 ENCOUNTER — Emergency Department (HOSPITAL_COMMUNITY)
Admission: EM | Admit: 2017-01-13 | Discharge: 2017-01-14 | Disposition: A | Discharge status: Home / Self Care | Payer: Medicare Other | Attending: Emergency Medicine | Admitting: Emergency Medicine

## 2017-01-13 ENCOUNTER — Emergency Department (HOSPITAL_COMMUNITY): Payer: Medicare Other

## 2017-01-13 DIAGNOSIS — R51 Headache: Secondary | ICD-10-CM | POA: Diagnosis not present

## 2017-01-13 DIAGNOSIS — Z7984 Long term (current) use of oral hypoglycemic drugs: Secondary | ICD-10-CM | POA: Insufficient documentation

## 2017-01-13 DIAGNOSIS — R42 Dizziness and giddiness: Secondary | ICD-10-CM | POA: Diagnosis not present

## 2017-01-13 DIAGNOSIS — Z79899 Other long term (current) drug therapy: Secondary | ICD-10-CM | POA: Diagnosis not present

## 2017-01-13 DIAGNOSIS — R519 Headache, unspecified: Secondary | ICD-10-CM

## 2017-01-13 DIAGNOSIS — I10 Essential (primary) hypertension: Secondary | ICD-10-CM | POA: Insufficient documentation

## 2017-01-13 DIAGNOSIS — E119 Type 2 diabetes mellitus without complications: Secondary | ICD-10-CM | POA: Insufficient documentation

## 2017-01-13 LAB — COMPREHENSIVE METABOLIC PANEL
ALT: 19 U/L (ref 14–54)
AST: 23 U/L (ref 15–41)
Albumin: 3.9 g/dL (ref 3.5–5.0)
Alkaline Phosphatase: 70 U/L (ref 38–126)
Anion gap: 8 (ref 5–15)
BUN: 9 mg/dL (ref 6–20)
CO2: 24 mmol/L (ref 22–32)
Calcium: 9.1 mg/dL (ref 8.9–10.3)
Chloride: 106 mmol/L (ref 101–111)
Creatinine, Ser: 0.6 mg/dL (ref 0.44–1.00)
GFR calc Af Amer: >60 mL/min (ref >60)
GFR calc non Af Amer: >60 mL/min (ref >60)
Glucose, Bld: 113 mg/dL — ABNORMAL HIGH (ref 65–99)
Potassium: 4 mmol/L (ref 3.5–5.1)
Sodium: 138 mmol/L (ref 135–145)
Total Bilirubin: 0.6 mg/dL (ref 0.3–1.2)
Total Protein: 7.2 g/dL (ref 6.5–8.1)

## 2017-01-13 LAB — CBC WITH DIFFERENTIAL/PLATELET
Basophils Absolute: 0 10*3/uL (ref 0.0–0.1)
Basophils Relative: 0 %
Eosinophils Absolute: 0.1 10*3/uL (ref 0.0–0.7)
Eosinophils Relative: 2 %
HCT: 41.8 % (ref 36.0–46.0)
Hemoglobin: 12.8 g/dL (ref 12.0–15.0)
Lymphocytes Relative: 43 %
Lymphs Abs: 2.9 10*3/uL (ref 0.7–4.0)
MCH: 26.2 pg (ref 26.0–34.0)
MCHC: 30.6 g/dL (ref 30.0–36.0)
MCV: 85.7 fL (ref 78.0–100.0)
Monocytes Absolute: 0.4 10*3/uL (ref 0.1–1.0)
Monocytes Relative: 5 %
Neutro Abs: 3.4 10*3/uL (ref 1.7–7.7)
Neutrophils Relative %: 50 %
Platelets: 222 10*3/uL (ref 150–400)
RBC: 4.88 MIL/uL (ref 3.87–5.11)
RDW: 14.1 % (ref 11.5–15.5)
WBC: 6.8 10*3/uL (ref 4.0–10.5)

## 2017-01-13 LAB — URINALYSIS, ROUTINE W REFLEX MICROSCOPIC
Bilirubin Urine: NEGATIVE
Glucose, UA: NEGATIVE mg/dL
Hgb urine dipstick: NEGATIVE
Ketones, ur: NEGATIVE mg/dL
Leukocytes, UA: NEGATIVE
Nitrite: NEGATIVE
Protein, ur: NEGATIVE mg/dL
Specific Gravity, Urine: 1.01 (ref 1.005–1.030)
pH: 7 (ref 5.0–8.0)

## 2017-01-13 LAB — ETHANOL: Alcohol, Ethyl (B): <5 mg/dL (ref <5)

## 2017-01-13 LAB — PROTIME-INR
INR: 0.98
Prothrombin Time: 13 seconds (ref 11.4–15.2)

## 2017-01-13 LAB — I-STAT CHEM 8, ED
BUN: 12 mg/dL (ref 6–20)
Calcium, Ion: 1.09 mmol/L — ABNORMAL LOW (ref 1.15–1.40)
Chloride: 106 mmol/L (ref 101–111)
Creatinine, Ser: 0.4 mg/dL — ABNORMAL LOW (ref 0.44–1.00)
Glucose, Bld: 114 mg/dL — ABNORMAL HIGH (ref 65–99)
HCT: 42 % (ref 36.0–46.0)
Hemoglobin: 14.3 g/dL (ref 12.0–15.0)
Potassium: 4.4 mmol/L (ref 3.5–5.1)
Sodium: 141 mmol/L (ref 135–145)
TCO2: 27 mmol/L (ref 0–100)

## 2017-01-13 LAB — I-STAT TROPONIN, ED: Troponin i, poc: 0 ng/mL (ref 0.00–0.08)

## 2017-01-13 LAB — RAPID URINE DRUG SCREEN, HOSP PERFORMED
Amphetamines: NOT DETECTED
Barbiturates: NOT DETECTED
Benzodiazepines: NOT DETECTED
Cocaine: NOT DETECTED
Opiates: NOT DETECTED
Tetrahydrocannabinol: NOT DETECTED

## 2017-01-13 LAB — APTT: aPTT: 26 seconds (ref 24–36)

## 2017-01-13 MED ORDER — ACETAMINOPHEN 325 MG PO TABS: 650.0000 mg | ORAL_TABLET | Freq: Once | ORAL | Status: DC

## 2017-01-13 MED ORDER — LISINOPRIL 5 MG PO TABS: 5.0000 mg | ORAL_TABLET | Freq: Every day | ORAL | 1 refills | Status: DC

## 2017-01-13 MED ORDER — LISINOPRIL 10 MG PO TABS: 5.0000 mg | ORAL_TABLET | Freq: Once | ORAL | Status: DC

## 2017-01-13 NOTE — Discharge Instructions (Signed)
Tonight you were evaluated for headache and hypertension  You MRI is normal without any sign of stroke. You have been started on Lisinopril for your elevated blood pressure  please take this on a daily basis Make an appointment with your PCP for follow up and blood pressure monitoring

## 2017-01-13 NOTE — ED Provider Notes (Signed)
[facility] DEPT Provider Note   CSN: [id number] Arrival date & time: 01/13/17  1850     History   Chief Complaint Chief Complaint  Patient presents with  . Headache    HPI Judith Chavez is a 71 y.o. female.  This a 71 year old female who presents with 2 days of the worst headache of her life.  It starts in the back of her head and radiates up it's worse on the right side than on the left.  She also has right frontal sinus tenderness. She has not taken any medication for headache.  She normally does not get headaches. She went to urgent care center for further evaluation to rule out TIA versus stroke. She does have a history of anxiety, depression, diabetes.  She states her blood sugars are running around 150.  She doesn't report any recent illnesses.  No sinus congestion or URI symptoms. Husband reports that the patient is ataxic requiring assistance on stairs, he has not noticed any slurred speech.  Patient does not report any visual disturbances.  No trouble with eye hand coordination depth perception reading. She does not have a personal history of hypertension, but at urgent care, and in this department.  She is quite hypertensive with a systolic reading of 710--GYI does report family history of hypertension in her father and siblings.      Past Medical History:  Diagnosis Date  . Anxiety   . Arthritis    knees  . Depression   . Diabetes mellitus without complication (HCC)    Type 2  . Headache    migraines  . History of pneumonia   . Numbness    "left foot"   . Poor circulation     Patient Active Problem List   Diagnosis Date Noted  . Spondylolisthesis at L3-L4 level 06/01/2015    Past Surgical History:  Procedure Laterality Date  . ABCESS DRAINAGE     "after cesarean- in hospital for 3 months"  . ABDOMINAL HYSTERECTOMY    . CESAREAN SECTION  1970  . CHOLECYSTECTOMY    . COLONOSCOPY    . EYE SURGERY Right   . MENISCUS REPAIR Bilateral   . SPINE  SURGERY      OB History    No data available       Home Medications    Prior to Admission medications   Medication Sig Start Date End Date Taking? Authorizing Provider  calcium-vitamin D 250-100 MG-UNIT tablet Take 1 tablet by mouth daily.    [provider]  Cholecalciferol (VITAMIN D-3 PO) Take 1 tablet by mouth daily.    [provider]  conjugated estrogens (PREMARIN) vaginal cream Place 1 Applicatorful vaginally at bedtime.    [provider]  diclofenac sodium (VOLTAREN) 1 % GEL Apply 2 g topically 4 (four) times daily. 07/22/16   Leandrew Koyanagi, MD  JANUMET XR [phone] MG TB24 TAKE 1 TABLET BY MOUTH WITH EVENING MEAL 01/05/16   [provider]  lisinopril (PRINIVIL,ZESTRIL) 5 MG tablet Take 1 tablet (5 mg total) by mouth daily. 01/13/17   Junius Creamer, NP  [facility] DELICA LANCETS 94W MISC 2 (two) times daily. for testing 12/14/15   [provider]  [facility] VERIO test strip USE AS DIRECTED TO TEST BLOOD SUGAR TWICE A DAY 12/09/15   [provider]  sertraline (ZOLOFT) 50 MG tablet Take 50 mg by mouth every other day.    [provider]  Triamcinolone Acetonide (NASACORT ALLERGY 24HR NA) Place 1  spray into both nostrils daily as needed (for allergies).    [provider]    Family History History reviewed. No pertinent family history.  Social History Social History  Substance Use Topics  . Smoking status: Never Smoker  . Smokeless tobacco: Never Used  . Alcohol use Yes     Comment: occassionally     Allergies   Patient has no known allergies.   Review of Systems Review of Systems  HENT: Positive for sinus pressure.   Eyes: Negative for photophobia and visual disturbance.  Respiratory: Negative for shortness of breath.   Gastrointestinal: Negative for abdominal pain.  Musculoskeletal: Negative for neck pain.  Neurological: Positive for dizziness and headaches. Negative for seizures, speech  difficulty, weakness and numbness.  All other systems reviewed and are negative.    Physical Exam Updated Vital Signs BP (!) 163/92 (BP Location: Right Arm)   Pulse 63   Temp 98.2 F (36.8 C)   Resp 16   SpO2 98%   Physical Exam  Constitutional: She is oriented to person, place, and time. She appears well-developed and well-nourished.  HENT:  Head: Normocephalic.  Right Ear: External ear normal.  Left Ear: External ear normal.  Mouth/Throat: Oropharynx is clear and moist.  Eyes: Conjunctivae and EOM are normal. Pupils are equal, round, and reactive to light. Right eye exhibits no nystagmus. Left eye exhibits no nystagmus.  Neck: Normal range of motion.  Cardiovascular: Normal rate and regular rhythm.   Pulmonary/Chest: Effort normal.  Neurological: She is alert and oriented to person, place, and time. No cranial nerve deficit or sensory deficit.  Skin: Skin is warm.  Psychiatric: She has a normal mood and affect.  Vitals reviewed.    ED Treatments / Results  Labs (all labs ordered are listed, but only abnormal results are displayed) Labs Reviewed  COMPREHENSIVE METABOLIC PANEL - Abnormal; Notable for the following:       Result Value   Glucose, Bld 113 (*)    All other components within normal limits  URINALYSIS, ROUTINE W REFLEX MICROSCOPIC - Abnormal; Notable for the following:    Color, Urine STRAW (*)    All other components within normal limits  I-STAT CHEM 8, ED - Abnormal; Notable for the following:    Creatinine, Ser 0.40 (*)    Glucose, Bld 114 (*)    Calcium, Ion 1.09 (*)    All other components within normal limits  CBC WITH DIFFERENTIAL/PLATELET  ETHANOL  PROTIME-INR  APTT  RAPID URINE DRUG SCREEN, HOSP PERFORMED  I-STAT TROPOININ, ED    EKG  EKG Interpretation None       Radiology Mr Brain Wo Contrast  Result Date: 01/13/2017 CLINICAL DATA:  Severe headache for several days. History of migraine headaches, diabetes, hypertension. EXAM: MRI  HEAD WITHOUT CONTRAST TECHNIQUE: Multiplanar, multiecho pulse sequences of the brain and surrounding structures were obtained without intravenous contrast. COMPARISON:  MRI of the head March 06, 2010 FINDINGS: BRAIN: No reduced diffusion to suggest acute ischemia. No susceptibility artifact to suggest hemorrhage. The ventricles and sulci are normal for patient's age. Scattered subcentimeter supratentorial white matter FLAIR T2 hyperintensities. No suspicious parenchymal signal, mass or mass effect. No abnormal extra-axial fluid collections. VASCULAR: Normal major intracranial vascular flow voids present at skull base. SKULL AND UPPER CERVICAL SPINE: Stable partially empty sella. No suspicious calvarial bone marrow signal. Craniocervical junction maintained. SINUSES/ORBITS: The mastoid air-cells and included paranasal sinuses are well-aerated. The included ocular globes and orbital contents are non-suspicious. OTHER:  None. IMPRESSION: 1. No acute intracranial process. 2. Progressed mild to moderate chronic small vessel ischemic disease. Electronically Signed   By: Elon Alas M.D.   On: 01/13/2017 22:19    Procedures Procedures (including critical care time)  Medications Ordered in ED Medications  lisinopril (PRINIVIL,ZESTRIL) tablet 5 mg (not administered)  acetaminophen (TYLENOL) tablet 650 mg (not administered)     Initial Impression / Assessment and Plan / ED Course  I have reviewed the triage vital signs and the nursing notes.  Pertinent labs & imaging results that were available during my care of the patient were reviewed by me and considered in my medical decision making (see chart for details).      MRI without sign of CVA will start Lisinopril 5 mg daily Follow up with PCP  Final Clinical Impressions(s) / ED Diagnoses   Final diagnoses:  Bad headache  Hypertension, unspecified type    New Prescriptions New Prescriptions   LISINOPRIL (PRINIVIL,ZESTRIL) 5 MG TABLET     Take 1 tablet (5 mg total) by mouth daily.     Junius Creamer, NP 01/13/17 [place], [place], MD 01/14/17 [id number]

## 2017-01-13 NOTE — ED Triage Notes (Signed)
Pt here for HA x several days more severe today was in back of head and very sharp; pt sent from [facility] for further eval

## 2017-01-13 NOTE — ED Notes (Addendum)
On way to MRI 

## 2017-01-30 DIAGNOSIS — I10 Essential (primary) hypertension: Secondary | ICD-10-CM | POA: Diagnosis not present

## 2017-03-15 DIAGNOSIS — M542 Cervicalgia: Secondary | ICD-10-CM | POA: Diagnosis not present

## 2017-03-15 DIAGNOSIS — M5137 Other intervertebral disc degeneration, lumbosacral region: Secondary | ICD-10-CM | POA: Diagnosis not present

## 2017-03-15 DIAGNOSIS — Z6830 Body mass index (BMI) 30.0-30.9, adult: Secondary | ICD-10-CM | POA: Diagnosis not present

## 2017-03-15 DIAGNOSIS — I10 Essential (primary) hypertension: Secondary | ICD-10-CM | POA: Diagnosis not present

## 2017-03-15 DIAGNOSIS — M5416 Radiculopathy, lumbar region: Secondary | ICD-10-CM | POA: Diagnosis not present

## 2017-03-16 DIAGNOSIS — M79605 Pain in left leg: Secondary | ICD-10-CM | POA: Diagnosis not present

## 2017-04-03 DIAGNOSIS — Z7984 Long term (current) use of oral hypoglycemic drugs: Secondary | ICD-10-CM | POA: Diagnosis not present

## 2017-04-03 DIAGNOSIS — Z0000 Encounter for general adult medical examination without abnormal findings: Secondary | ICD-10-CM | POA: Diagnosis not present

## 2017-04-03 DIAGNOSIS — F322 Major depressive disorder, single episode, severe without psychotic features: Secondary | ICD-10-CM | POA: Diagnosis not present

## 2017-04-03 DIAGNOSIS — Z7189 Other specified counseling: Secondary | ICD-10-CM | POA: Diagnosis not present

## 2017-04-03 DIAGNOSIS — Z1159 Encounter for screening for other viral diseases: Secondary | ICD-10-CM | POA: Diagnosis not present

## 2017-04-03 DIAGNOSIS — M549 Dorsalgia, unspecified: Secondary | ICD-10-CM | POA: Diagnosis not present

## 2017-04-03 DIAGNOSIS — E559 Vitamin D deficiency, unspecified: Secondary | ICD-10-CM | POA: Diagnosis not present

## 2017-04-03 DIAGNOSIS — N952 Postmenopausal atrophic vaginitis: Secondary | ICD-10-CM | POA: Diagnosis not present

## 2017-04-03 DIAGNOSIS — M8580 Other specified disorders of bone density and structure, unspecified site: Secondary | ICD-10-CM | POA: Diagnosis not present

## 2017-04-03 DIAGNOSIS — E1165 Type 2 diabetes mellitus with hyperglycemia: Secondary | ICD-10-CM | POA: Diagnosis not present

## 2017-04-03 DIAGNOSIS — M1990 Unspecified osteoarthritis, unspecified site: Secondary | ICD-10-CM | POA: Diagnosis not present

## 2017-04-03 DIAGNOSIS — A6000 Herpesviral infection of urogenital system, unspecified: Secondary | ICD-10-CM | POA: Diagnosis not present

## 2017-04-03 DIAGNOSIS — D179 Benign lipomatous neoplasm, unspecified: Secondary | ICD-10-CM | POA: Diagnosis not present

## 2017-04-24 ENCOUNTER — Ambulatory Visit (INDEPENDENT_AMBULATORY_CARE_PROVIDER_SITE_OTHER): Payer: Medicare Other

## 2017-04-24 ENCOUNTER — Ambulatory Visit (INDEPENDENT_AMBULATORY_CARE_PROVIDER_SITE_OTHER): Payer: Medicare Other | Admitting: Orthopaedic Surgery

## 2017-04-24 ENCOUNTER — Encounter (INDEPENDENT_AMBULATORY_CARE_PROVIDER_SITE_OTHER): Payer: Self-pay | Admitting: Orthopaedic Surgery

## 2017-04-24 DIAGNOSIS — M170 Bilateral primary osteoarthritis of knee: Secondary | ICD-10-CM | POA: Diagnosis not present

## 2017-04-24 MED ORDER — LIDOCAINE HCL 1 % IJ SOLN
2.0000 mL | INTRAMUSCULAR | Status: AC | PRN
  Administered 2017-04-24: 2 mL

## 2017-04-24 MED ORDER — METHYLPREDNISOLONE ACETATE 40 MG/ML IJ SUSP
40.0000 mg | INTRAMUSCULAR | Status: AC | PRN
  Administered 2017-04-24: 40 mg via INTRA_ARTICULAR

## 2017-04-24 MED ORDER — BUPIVACAINE HCL 0.5 % IJ SOLN
2.0000 mL | INTRAMUSCULAR | Status: AC | PRN
  Administered 2017-04-24: 2 mL via INTRA_ARTICULAR

## 2017-04-24 MED ORDER — METHYLPREDNISOLONE ACETATE 40 MG/ML IJ SUSP
40.0000 mg | INTRAMUSCULAR | Status: AC | PRN
Start: 2017-04-24 — End: 2017-04-24
  Administered 2017-04-24: 40 mg via INTRA_ARTICULAR

## 2017-04-24 NOTE — Progress Notes (Signed)
Office Visit Note   Patient: Judith Chavez           Date of Birth: 1946-05-31           MRN: [id number] Visit Date: 04/24/2017              Requested by: Mayra Neer, MD [address] [place], [place] [address] PCP: Mayra Neer, MD   Assessment & Plan: Visit Diagnoses:  1. Primary osteoarthritis of both knees     Plan: Patient has advanced degenerative joint disease of bilateral knees. Bilateral knee cortisone injections were performed today. We did give her reading information on total knee replacement surgery. We briefly discussed this. It sounds like she is getting close to wanting to have a knee replacement since this seems to be severely affecting her quality of life and ADLs.  Follow-Up Instructions: Return if symptoms worsen or fail to improve.   Orders:  Orders Placed This Encounter  Procedures  . XR Knee 1-2 Views Left  . XR Knee 1-2 Views Right   No orders of the defined types were placed in this encounter.     Procedures: Large Joint Inj Date/Time: 04/24/2017 1:52 PM Performed by: Leandrew Koyanagi Authorized by: Leandrew Koyanagi   Consent Given by:  Patient Timeout: prior to procedure the correct patient, procedure, and site was verified   Indications:  Pain Location:  Knee Site:  R knee Prep: patient was prepped and draped in usual sterile fashion   Needle Size:  22 G Approach:  Lateral Ultrasound Guidance: No   Fluoroscopic Guidance: No   Arthrogram: No   Medications:  2 mL bupivacaine 0.5 %; 40 mg methylPREDNISolone acetate 40 MG/ML; 2 mL lidocaine 1 % Patient tolerance:  Patient tolerated the procedure well with no immediate complications Large Joint Inj Date/Time: 04/24/2017 1:52 PM Performed by: Leandrew Koyanagi Authorized by: Leandrew Koyanagi   Consent Given by:  Patient Timeout: prior to procedure the correct patient, procedure, and site was verified   Indications:  Pain Location:  Knee Site:  L knee Prep: patient was prepped  and draped in usual sterile fashion   Needle Size:  22 G Ultrasound Guidance: No   Fluoroscopic Guidance: No   Arthrogram: No   Medications:  2 mL lidocaine 1 %; 2 mL bupivacaine 0.5 %; 40 mg methylPREDNISolone acetate 40 MG/ML Patient tolerance:  Patient tolerated the procedure well with no immediate complications     Clinical Data: No additional findings.   Subjective: Chief Complaint  Patient presents with  . Right Knee - Pain  . Left Knee - Pain    Patient follows up today for bilateral knee osteoarthritis. This continues severely affect her quality of life and ADLs. Her previous injection gave her couple months of relief. She is supposed to go on vacation soon and is Requesting Another Injection. She denies any changes in medical history. Denies a numbness and tingling.     Review of Systems  Constitutional: Negative.   HENT: Negative.   Eyes: Negative.   Respiratory: Negative.   Cardiovascular: Negative.   Endocrine: Negative.   Musculoskeletal: Negative.   Neurological: Negative.   Hematological: Negative.   Psychiatric/Behavioral: Negative.   All other systems reviewed and are negative.    Objective: Vital Signs: There were no vitals taken for this visit.  Physical Exam  Constitutional: She is oriented to person, place, and time. She appears well-developed and well-nourished.  Pulmonary/Chest: Effort normal.  Neurological: She  is alert and oriented to person, place, and time.  Skin: Skin is warm. Capillary refill takes less than 2 seconds.  Psychiatric: She has a normal mood and affect. Her behavior is normal. Judgment and thought content normal.  Nursing note and vitals reviewed.   Ortho Exam Right and left knee exams are stable. No significant effusion Specialty Comments:  No specialty comments available.  Imaging: Xr Knee 1-2 Views Left  Result Date: 04/24/2017 Advanced DJD  Xr Knee 1-2 Views Right  Result Date: 04/24/2017 Advanced  DJD    PMFS History: Patient Active Problem List   Diagnosis Date Noted  . Primary osteoarthritis of both knees 04/24/2017  . Spondylolisthesis at L3-L4 level 06/01/2015   Past Medical History:  Diagnosis Date  . Anxiety   . Arthritis    knees  . Depression   . Diabetes mellitus without complication (HCC)    Type 2  . Headache    migraines  . History of pneumonia   . Numbness    "left foot"   . Poor circulation     No family history on file.  Past Surgical History:  Procedure Laterality Date  . ABCESS DRAINAGE     "after cesarean- in hospital for 3 months"  . ABDOMINAL HYSTERECTOMY    . CESAREAN SECTION  1970  . CHOLECYSTECTOMY    . COLONOSCOPY    . EYE SURGERY Right   . MENISCUS REPAIR Bilateral   . SPINE SURGERY     Social History   Occupational History  . Not on file.   Social History Main Topics  . Smoking status: Never Smoker  . Smokeless tobacco: Never Used  . Alcohol use Yes     Comment: occassionally  . Drug use: No  . Sexual activity: Not on file

## 2017-05-09 ENCOUNTER — Other Ambulatory Visit: Payer: Self-pay | Admitting: Family Medicine

## 2017-05-09 DIAGNOSIS — Z1231 Encounter for screening mammogram for malignant neoplasm of breast: Secondary | ICD-10-CM

## 2017-05-29 DIAGNOSIS — R2232 Localized swelling, mass and lump, left upper limb: Secondary | ICD-10-CM | POA: Diagnosis not present

## 2017-05-29 DIAGNOSIS — M25532 Pain in left wrist: Secondary | ICD-10-CM | POA: Diagnosis not present

## 2017-05-31 ENCOUNTER — Ambulatory Visit
Admission: RE | Admit: 2017-05-31 | Discharge: 2017-05-31 | Disposition: A | Discharge status: Home / Self Care | Payer: Medicare Other | Source: Ambulatory Visit | Attending: Family Medicine | Admitting: Family Medicine

## 2017-05-31 DIAGNOSIS — Z1231 Encounter for screening mammogram for malignant neoplasm of breast: Secondary | ICD-10-CM

## 2017-06-07 DIAGNOSIS — R2232 Localized swelling, mass and lump, left upper limb: Secondary | ICD-10-CM | POA: Diagnosis not present

## 2017-06-09 DIAGNOSIS — R197 Diarrhea, unspecified: Secondary | ICD-10-CM | POA: Diagnosis not present

## 2017-06-14 DIAGNOSIS — M25532 Pain in left wrist: Secondary | ICD-10-CM | POA: Diagnosis not present

## 2017-06-14 DIAGNOSIS — R2232 Localized swelling, mass and lump, left upper limb: Secondary | ICD-10-CM | POA: Diagnosis not present

## 2017-07-08 DIAGNOSIS — H6121 Impacted cerumen, right ear: Secondary | ICD-10-CM | POA: Diagnosis not present

## 2017-07-08 DIAGNOSIS — H6691 Otitis media, unspecified, right ear: Secondary | ICD-10-CM | POA: Diagnosis not present

## 2017-08-17 DIAGNOSIS — R05 Cough: Secondary | ICD-10-CM | POA: Diagnosis not present

## 2017-08-17 DIAGNOSIS — J9801 Acute bronchospasm: Secondary | ICD-10-CM | POA: Diagnosis not present

## 2017-08-17 DIAGNOSIS — B349 Viral infection, unspecified: Secondary | ICD-10-CM | POA: Diagnosis not present

## 2017-09-06 ENCOUNTER — Other Ambulatory Visit: Payer: Self-pay | Admitting: Family Medicine

## 2017-09-06 ENCOUNTER — Ambulatory Visit
Admission: RE | Admit: 2017-09-06 | Discharge: 2017-09-06 | Disposition: A | Discharge status: Home / Self Care | Payer: Medicare Other | Source: Ambulatory Visit | Attending: Family Medicine | Admitting: Family Medicine

## 2017-09-06 DIAGNOSIS — R059 Cough, unspecified: Secondary | ICD-10-CM

## 2017-09-06 DIAGNOSIS — R05 Cough: Secondary | ICD-10-CM | POA: Diagnosis not present

## 2017-09-06 DIAGNOSIS — B349 Viral infection, unspecified: Secondary | ICD-10-CM | POA: Diagnosis not present

## 2017-09-28 ENCOUNTER — Ambulatory Visit (INDEPENDENT_AMBULATORY_CARE_PROVIDER_SITE_OTHER): Payer: Medicare Other | Admitting: Orthopaedic Surgery

## 2017-09-28 DIAGNOSIS — M1711 Unilateral primary osteoarthritis, right knee: Secondary | ICD-10-CM

## 2017-09-28 MED ORDER — BUPIVACAINE HCL 0.5 % IJ SOLN
2.0000 mL | INTRAMUSCULAR | Status: AC | PRN
  Administered 2017-09-28: 2 mL via INTRA_ARTICULAR

## 2017-09-28 MED ORDER — METHYLPREDNISOLONE ACETATE 40 MG/ML IJ SUSP
40.0000 mg | INTRAMUSCULAR | Status: AC | PRN
  Administered 2017-09-28: 40 mg via INTRA_ARTICULAR

## 2017-09-28 MED ORDER — LIDOCAINE HCL 1 % IJ SOLN
2.0000 mL | INTRAMUSCULAR | Status: AC | PRN
  Administered 2017-09-28: 2 mL

## 2017-09-28 NOTE — Progress Notes (Signed)
Office Visit Note   Patient: Judith Chavez           Date of Birth: 09-16-1945           MRN: [id number] Visit Date: 09/28/2017              Requested by: Mayra Neer, MD [address] [place], [place] [address] PCP: Mayra Neer, MD   Assessment & Plan: Visit Diagnoses:  1. Unilateral primary osteoarthritis, right knee     Plan: Impression is 72 year old female with degenerative joint disease right knee.  Cortisone injection performed today.  Patient is not ready for knee replacement at this point.  She is still getting around just fine.  Follow-up as needed.  Follow-Up Instructions: Return if symptoms worsen or fail to improve.   Orders:  No orders of the defined types were placed in this encounter.  No orders of the defined types were placed in this encounter.     Procedures: Large Joint Inj: R knee on 09/28/2017 11:13 AM Indications: pain Details: 22 G needle  Arthrogram: No  Medications: 40 mg methylPREDNISolone acetate 40 MG/ML; 2 mL lidocaine 1 %; 2 mL bupivacaine 0.5 % Consent was given by the patient. Patient was prepped and draped in the usual sterile fashion.       Clinical Data: No additional findings.   Subjective: Chief Complaint  Patient presents with  . Right Knee - Pain    Patient comes in today for right knee pain.  Her previous injection helped.  She received this injection about 5 months ago.  She is continued Voltaren gel.   Review of Systems  Constitutional: Negative.   HENT: Negative.   Eyes: Negative.   Respiratory: Negative.   Cardiovascular: Negative.   Endocrine: Negative.   Musculoskeletal: Negative.   Neurological: Negative.   Hematological: Negative.   Psychiatric/Behavioral: Negative.   All other systems reviewed and are negative.    Objective: Vital Signs: There were no vitals taken for this visit.  Physical Exam  Constitutional: She is oriented to person, place, and time. She appears  well-developed and well-nourished.  Pulmonary/Chest: Effort normal.  Neurological: She is alert and oriented to person, place, and time.  Skin: Skin is warm. Capillary refill takes less than 2 seconds.  Psychiatric: She has a normal mood and affect. Her behavior is normal. Judgment and thought content normal.  Nursing note and vitals reviewed.   Ortho Exam Right knee exam shows a trace effusion.  Collaterals and cruciates are stable. Specialty Comments:  No specialty comments available.  Imaging: No results found.   PMFS History: Patient Active Problem List   Diagnosis Date Noted  . Primary osteoarthritis of both knees 04/24/2017  . Spondylolisthesis at L3-L4 level 06/01/2015   Past Medical History:  Diagnosis Date  . Anxiety   . Arthritis    knees  . Depression   . Diabetes mellitus without complication (HCC)    Type 2  . Headache    migraines  . History of pneumonia   . Numbness    "left foot"   . Poor circulation     No family history on file.  Past Surgical History:  Procedure Laterality Date  . ABCESS DRAINAGE     "after cesarean- in hospital for 3 months"  . ABDOMINAL HYSTERECTOMY    . CESAREAN SECTION  1970  . CHOLECYSTECTOMY    . COLONOSCOPY    . EYE SURGERY Right   . MENISCUS REPAIR Bilateral   .  SPINE SURGERY     Social History   Occupational History  . Not on file  Tobacco Use  . Smoking status: Never Smoker  . Smokeless tobacco: Never Used  Substance and Sexual Activity  . Alcohol use: Yes    Comment: occassionally  . Drug use: No  . Sexual activity: Not on file

## 2017-09-29 DIAGNOSIS — H5212 Myopia, left eye: Secondary | ICD-10-CM | POA: Diagnosis not present

## 2017-09-29 DIAGNOSIS — Z7984 Long term (current) use of oral hypoglycemic drugs: Secondary | ICD-10-CM | POA: Diagnosis not present

## 2017-09-29 DIAGNOSIS — H2513 Age-related nuclear cataract, bilateral: Secondary | ICD-10-CM | POA: Diagnosis not present

## 2017-09-29 DIAGNOSIS — H5201 Hypermetropia, right eye: Secondary | ICD-10-CM | POA: Diagnosis not present

## 2017-09-29 DIAGNOSIS — E119 Type 2 diabetes mellitus without complications: Secondary | ICD-10-CM | POA: Diagnosis not present

## 2017-09-29 DIAGNOSIS — H524 Presbyopia: Secondary | ICD-10-CM | POA: Diagnosis not present

## 2017-09-29 DIAGNOSIS — H52223 Regular astigmatism, bilateral: Secondary | ICD-10-CM | POA: Diagnosis not present

## 2017-09-29 DIAGNOSIS — H11153 Pinguecula, bilateral: Secondary | ICD-10-CM | POA: Diagnosis not present

## 2017-10-02 DIAGNOSIS — Z7984 Long term (current) use of oral hypoglycemic drugs: Secondary | ICD-10-CM | POA: Diagnosis not present

## 2017-10-02 DIAGNOSIS — F322 Major depressive disorder, single episode, severe without psychotic features: Secondary | ICD-10-CM | POA: Diagnosis not present

## 2017-10-02 DIAGNOSIS — E1165 Type 2 diabetes mellitus with hyperglycemia: Secondary | ICD-10-CM | POA: Diagnosis not present

## 2017-10-05 DIAGNOSIS — H6243 Otitis externa in other diseases classified elsewhere, bilateral: Secondary | ICD-10-CM | POA: Diagnosis not present

## 2017-10-05 DIAGNOSIS — B369 Superficial mycosis, unspecified: Secondary | ICD-10-CM | POA: Diagnosis not present

## 2017-10-05 DIAGNOSIS — Z87891 Personal history of nicotine dependence: Secondary | ICD-10-CM | POA: Diagnosis not present

## 2017-10-05 DIAGNOSIS — H9201 Otalgia, right ear: Secondary | ICD-10-CM | POA: Diagnosis not present

## 2017-10-18 DIAGNOSIS — H6240 Otitis externa in other diseases classified elsewhere, unspecified ear: Secondary | ICD-10-CM | POA: Diagnosis not present

## 2017-10-18 DIAGNOSIS — B369 Superficial mycosis, unspecified: Secondary | ICD-10-CM | POA: Diagnosis not present

## 2017-11-01 DIAGNOSIS — B369 Superficial mycosis, unspecified: Secondary | ICD-10-CM | POA: Diagnosis not present

## 2017-11-01 DIAGNOSIS — H6240 Otitis externa in other diseases classified elsewhere, unspecified ear: Secondary | ICD-10-CM | POA: Diagnosis not present

## 2017-11-30 ENCOUNTER — Telehealth (INDEPENDENT_AMBULATORY_CARE_PROVIDER_SITE_OTHER): Payer: Self-pay | Admitting: Orthopaedic Surgery

## 2017-11-30 ENCOUNTER — Ambulatory Visit (INDEPENDENT_AMBULATORY_CARE_PROVIDER_SITE_OTHER): Payer: Medicare Other | Admitting: Orthopaedic Surgery

## 2017-11-30 ENCOUNTER — Encounter (INDEPENDENT_AMBULATORY_CARE_PROVIDER_SITE_OTHER): Payer: Self-pay | Admitting: Orthopaedic Surgery

## 2017-11-30 DIAGNOSIS — M1711 Unilateral primary osteoarthritis, right knee: Secondary | ICD-10-CM | POA: Diagnosis not present

## 2017-11-30 MED ORDER — DICLOFENAC SODIUM 1 % TD GEL: 2.0000 g | Freq: Four times a day (QID) | TRANSDERMAL | 11 refills | Status: DC

## 2017-11-30 NOTE — Progress Notes (Signed)
Office Visit Note   Patient: Judith Chavez           Date of Birth: 1946/01/17           MRN: [id number] Visit Date: 11/30/2017              Requested by: Mayra Neer, MD [address] [place], [place] [address] PCP: Mayra Neer, MD   Assessment & Plan: Visit Diagnoses:  1. Unilateral primary osteoarthritis, right knee     Plan: Patient is end-stage right knee degenerative joint disease.  Patient has had only temporary relief from cortisone injections.  We did discuss a knee replacement versus Visco supplementation.  She will think about this.  I have provided her with lots of information on this.  Voltaren gel was refilled.  Follow-Up Instructions: Return if symptoms worsen or fail to improve.   Orders:  No orders of the defined types were placed in this encounter.  Meds ordered this encounter  Medications  . diclofenac sodium (VOLTAREN) 1 % GEL    Sig: Apply 2 g topically 4 (four) times daily.    Dispense:  1 Tube    Refill:  11      Procedures: No procedures performed   Clinical Data: No additional findings.   Subjective: Chief Complaint  Patient presents with  . Right Knee - Pain    Judith Chavez comes in today for follow-up of knee pain.  She received only 2 weeks of good pain relief from the previous cortisone injection which was performed about 2 months ago.  She is very hesitant to have a knee replacement but at the same time her quality of life has significantly deteriorated.  She has difficulty with ADLs and chronic pain.  She is diabetic.   Review of Systems  Constitutional: Negative.   HENT: Negative.   Eyes: Negative.   Respiratory: Negative.   Cardiovascular: Negative.   Endocrine: Negative.   Musculoskeletal: Negative.   Neurological: Negative.   Hematological: Negative.   Psychiatric/Behavioral: Negative.   All other systems reviewed and are negative.    Objective: Vital Signs: There were no vitals taken for this  visit.  Physical Exam  Constitutional: She is oriented to person, place, and time. She appears well-developed and well-nourished.  Pulmonary/Chest: Effort normal.  Neurological: She is alert and oriented to person, place, and time.  Skin: Skin is warm. Capillary refill takes less than 2 seconds.  Psychiatric: She has a normal mood and affect. Her behavior is normal. Judgment and thought content normal.  Nursing note and vitals reviewed.   Ortho Exam Right knee exam shows no joint effusion.  Collaterals and cruciates are stable. Specialty Comments:  No specialty comments available.  Imaging: No results found.   PMFS History: Patient Active Problem List   Diagnosis Date Noted  . Primary osteoarthritis of both knees 04/24/2017  . Spondylolisthesis at L3-L4 level 06/01/2015   Past Medical History:  Diagnosis Date  . Anxiety   . Arthritis    knees  . Depression   . Diabetes mellitus without complication (HCC)    Type 2  . Headache    migraines  . History of pneumonia   . Numbness    "left foot"   . Poor circulation     History reviewed. No pertinent family history.  Past Surgical History:  Procedure Laterality Date  . ABCESS DRAINAGE     "after cesarean- in hospital for 3 months"  . ABDOMINAL HYSTERECTOMY    .  CESAREAN SECTION  1970  . CHOLECYSTECTOMY    . COLONOSCOPY    . EYE SURGERY Right   . MENISCUS REPAIR Bilateral   . SPINE SURGERY     Social History   Occupational History  . Not on file  Tobacco Use  . Smoking status: Never Smoker  . Smokeless tobacco: Never Used  Substance and Sexual Activity  . Alcohol use: Yes    Comment: occassionally  . Drug use: No  . Sexual activity: Not on file

## 2017-11-30 NOTE — Telephone Encounter (Signed)
Patient called to apologize for her meltdown with Dr. Erlinda Hong, she just wanted to let him know.

## 2017-11-30 NOTE — Telephone Encounter (Signed)
No need to apologize at all.  We're here to help with anything she needs.

## 2017-11-30 NOTE — Telephone Encounter (Signed)
See message below °

## 2017-12-15 DIAGNOSIS — K439 Ventral hernia without obstruction or gangrene: Secondary | ICD-10-CM | POA: Diagnosis not present

## 2018-01-03 ENCOUNTER — Telehealth (INDEPENDENT_AMBULATORY_CARE_PROVIDER_SITE_OTHER): Payer: Self-pay | Admitting: Orthopaedic Surgery

## 2018-01-03 NOTE — Telephone Encounter (Signed)
Patient called stated at last appt she was given a choice of heaving a knee replacement or gel injections. She wants to have injections.   Please call to advise where she goes from here. I advised patient that it would have to be referred and authorizations obtained...she still insisted a call back

## 2018-01-04 NOTE — Telephone Encounter (Signed)
Please submit gel injection for right knee- Dr Xu 

## 2018-01-04 NOTE — Telephone Encounter (Signed)
Noted  

## 2018-01-09 ENCOUNTER — Telehealth (INDEPENDENT_AMBULATORY_CARE_PROVIDER_SITE_OTHER): Payer: Self-pay

## 2018-01-09 NOTE — Telephone Encounter (Signed)
Submitted application online for Monovisc, right knee. 

## 2018-01-18 ENCOUNTER — Telehealth (INDEPENDENT_AMBULATORY_CARE_PROVIDER_SITE_OTHER): Payer: Self-pay

## 2018-01-18 NOTE — Telephone Encounter (Signed)
Patient approved for Monovisc, right knee. Buy & Bill $25.00 co-pay No PA required  Appt. Scheduled 01/24/18

## 2018-01-22 DIAGNOSIS — Z7984 Long term (current) use of oral hypoglycemic drugs: Secondary | ICD-10-CM | POA: Diagnosis not present

## 2018-01-22 DIAGNOSIS — F322 Major depressive disorder, single episode, severe without psychotic features: Secondary | ICD-10-CM | POA: Diagnosis not present

## 2018-01-22 DIAGNOSIS — E1165 Type 2 diabetes mellitus with hyperglycemia: Secondary | ICD-10-CM | POA: Diagnosis not present

## 2018-01-24 ENCOUNTER — Ambulatory Visit (INDEPENDENT_AMBULATORY_CARE_PROVIDER_SITE_OTHER): Payer: Medicare Other | Admitting: Orthopaedic Surgery

## 2018-01-24 ENCOUNTER — Encounter (INDEPENDENT_AMBULATORY_CARE_PROVIDER_SITE_OTHER): Payer: Self-pay | Admitting: Orthopaedic Surgery

## 2018-01-24 DIAGNOSIS — M1711 Unilateral primary osteoarthritis, right knee: Secondary | ICD-10-CM

## 2018-01-24 MED ORDER — HYALURONAN 88 MG/4ML IX SOSY
88.0000 mg | PREFILLED_SYRINGE | INTRA_ARTICULAR | Status: AC | PRN
  Administered 2018-01-24: 88 mg via INTRA_ARTICULAR

## 2018-01-24 NOTE — Progress Notes (Signed)
   Procedure Note  Patient: Judith Chavez             Date of Birth: 02-23-1946           MRN: [id number]             Visit Date: 01/24/2018  Procedures: Visit Diagnoses: Unilateral primary osteoarthritis, right knee  Large Joint Inj: R knee on 01/24/2018 2:43 PM Indications: pain Details: 22 G needle  Arthrogram: No  Medications: 88 mg Hyaluronan 88 MG/4ML Outcome: tolerated well, no immediate complications Patient was prepped and draped in the usual sterile fashion.

## 2018-04-27 ENCOUNTER — Other Ambulatory Visit: Payer: Self-pay | Admitting: Family Medicine

## 2018-04-27 DIAGNOSIS — N952 Postmenopausal atrophic vaginitis: Secondary | ICD-10-CM | POA: Diagnosis not present

## 2018-04-27 DIAGNOSIS — N6310 Unspecified lump in the right breast, unspecified quadrant: Secondary | ICD-10-CM

## 2018-04-27 DIAGNOSIS — A6000 Herpesviral infection of urogenital system, unspecified: Secondary | ICD-10-CM | POA: Diagnosis not present

## 2018-04-27 DIAGNOSIS — M791 Myalgia, unspecified site: Secondary | ICD-10-CM | POA: Diagnosis not present

## 2018-04-27 DIAGNOSIS — G4700 Insomnia, unspecified: Secondary | ICD-10-CM | POA: Diagnosis not present

## 2018-04-27 DIAGNOSIS — Z0000 Encounter for general adult medical examination without abnormal findings: Secondary | ICD-10-CM | POA: Diagnosis not present

## 2018-04-27 DIAGNOSIS — M1990 Unspecified osteoarthritis, unspecified site: Secondary | ICD-10-CM | POA: Diagnosis not present

## 2018-04-27 DIAGNOSIS — E1165 Type 2 diabetes mellitus with hyperglycemia: Secondary | ICD-10-CM | POA: Diagnosis not present

## 2018-04-27 DIAGNOSIS — E559 Vitamin D deficiency, unspecified: Secondary | ICD-10-CM | POA: Diagnosis not present

## 2018-04-27 DIAGNOSIS — F322 Major depressive disorder, single episode, severe without psychotic features: Secondary | ICD-10-CM | POA: Diagnosis not present

## 2018-04-27 DIAGNOSIS — N63 Unspecified lump in unspecified breast: Secondary | ICD-10-CM | POA: Diagnosis not present

## 2018-04-27 DIAGNOSIS — M8580 Other specified disorders of bone density and structure, unspecified site: Secondary | ICD-10-CM | POA: Diagnosis not present

## 2018-04-27 DIAGNOSIS — M549 Dorsalgia, unspecified: Secondary | ICD-10-CM | POA: Diagnosis not present

## 2018-05-11 DIAGNOSIS — Z23 Encounter for immunization: Secondary | ICD-10-CM | POA: Diagnosis not present

## 2018-05-23 DIAGNOSIS — M8588 Other specified disorders of bone density and structure, other site: Secondary | ICD-10-CM | POA: Diagnosis not present

## 2018-06-01 ENCOUNTER — Ambulatory Visit
Admission: RE | Admit: 2018-06-01 | Discharge: 2018-06-01 | Disposition: A | Discharge status: Home / Self Care | Payer: Medicare Other | Source: Ambulatory Visit | Attending: Family Medicine | Admitting: Family Medicine

## 2018-06-01 DIAGNOSIS — N6310 Unspecified lump in the right breast, unspecified quadrant: Secondary | ICD-10-CM

## 2018-06-01 DIAGNOSIS — N644 Mastodynia: Secondary | ICD-10-CM | POA: Diagnosis not present

## 2018-06-01 DIAGNOSIS — R928 Other abnormal and inconclusive findings on diagnostic imaging of breast: Secondary | ICD-10-CM | POA: Diagnosis not present

## 2018-07-09 DIAGNOSIS — J069 Acute upper respiratory infection, unspecified: Secondary | ICD-10-CM | POA: Diagnosis not present

## 2018-07-09 DIAGNOSIS — J0100 Acute maxillary sinusitis, unspecified: Secondary | ICD-10-CM | POA: Diagnosis not present

## 2018-07-26 DIAGNOSIS — Z7984 Long term (current) use of oral hypoglycemic drugs: Secondary | ICD-10-CM | POA: Diagnosis not present

## 2018-07-26 DIAGNOSIS — K219 Gastro-esophageal reflux disease without esophagitis: Secondary | ICD-10-CM | POA: Diagnosis not present

## 2018-07-26 DIAGNOSIS — F322 Major depressive disorder, single episode, severe without psychotic features: Secondary | ICD-10-CM | POA: Diagnosis not present

## 2018-07-26 DIAGNOSIS — R251 Tremor, unspecified: Secondary | ICD-10-CM | POA: Diagnosis not present

## 2018-07-26 DIAGNOSIS — G4700 Insomnia, unspecified: Secondary | ICD-10-CM | POA: Diagnosis not present

## 2018-07-26 DIAGNOSIS — M1990 Unspecified osteoarthritis, unspecified site: Secondary | ICD-10-CM | POA: Diagnosis not present

## 2018-07-26 DIAGNOSIS — M8580 Other specified disorders of bone density and structure, unspecified site: Secondary | ICD-10-CM | POA: Diagnosis not present

## 2018-07-26 DIAGNOSIS — E1165 Type 2 diabetes mellitus with hyperglycemia: Secondary | ICD-10-CM | POA: Diagnosis not present

## 2018-07-26 DIAGNOSIS — R51 Headache: Secondary | ICD-10-CM | POA: Diagnosis not present

## 2018-11-02 DIAGNOSIS — K219 Gastro-esophageal reflux disease without esophagitis: Secondary | ICD-10-CM | POA: Diagnosis not present

## 2018-11-02 DIAGNOSIS — G4700 Insomnia, unspecified: Secondary | ICD-10-CM | POA: Diagnosis not present

## 2018-11-02 DIAGNOSIS — F322 Major depressive disorder, single episode, severe without psychotic features: Secondary | ICD-10-CM | POA: Diagnosis not present

## 2018-11-02 DIAGNOSIS — I10 Essential (primary) hypertension: Secondary | ICD-10-CM | POA: Diagnosis not present

## 2018-11-02 DIAGNOSIS — E1169 Type 2 diabetes mellitus with other specified complication: Secondary | ICD-10-CM | POA: Diagnosis not present

## 2018-11-02 DIAGNOSIS — Z7984 Long term (current) use of oral hypoglycemic drugs: Secondary | ICD-10-CM | POA: Diagnosis not present

## 2018-12-31 ENCOUNTER — Other Ambulatory Visit: Payer: Self-pay | Admitting: Gastroenterology

## 2018-12-31 DIAGNOSIS — K219 Gastro-esophageal reflux disease without esophagitis: Secondary | ICD-10-CM

## 2018-12-31 DIAGNOSIS — R1084 Generalized abdominal pain: Secondary | ICD-10-CM | POA: Diagnosis not present

## 2019-01-02 ENCOUNTER — Other Ambulatory Visit: Payer: Medicare Other

## 2019-01-10 ENCOUNTER — Ambulatory Visit
Admission: RE | Admit: 2019-01-10 | Discharge: 2019-01-10 | Disposition: A | Discharge status: Home / Self Care | Payer: Medicare Other | Source: Ambulatory Visit | Attending: Gastroenterology | Admitting: Gastroenterology

## 2019-01-10 DIAGNOSIS — K219 Gastro-esophageal reflux disease without esophagitis: Secondary | ICD-10-CM

## 2019-01-10 DIAGNOSIS — R1084 Generalized abdominal pain: Secondary | ICD-10-CM

## 2019-01-10 DIAGNOSIS — K449 Diaphragmatic hernia without obstruction or gangrene: Secondary | ICD-10-CM | POA: Diagnosis not present

## 2019-02-20 ENCOUNTER — Other Ambulatory Visit: Payer: Self-pay

## 2019-02-20 ENCOUNTER — Encounter: Payer: Medicare Other | Attending: Family Medicine | Admitting: *Deleted

## 2019-02-20 DIAGNOSIS — E119 Type 2 diabetes mellitus without complications: Secondary | ICD-10-CM

## 2019-02-20 NOTE — Patient Instructions (Addendum)
Plan:  Aim for 2-3 Carb Choices per meal (30-45 grams)  Aim for 0-1 Carbs per snack if hungry  Include protein in moderation with your meals and snacks Consider reading food labels for Total Carbohydrate of foods Continue with your activity level by walking and playing tennis for 30-60 minutes daily as tolerated Continue checking BG at alternate times per day  Continue taking medication as directed by MD

## 2019-02-21 NOTE — Progress Notes (Signed)
Diabetes Self-Management Education  Visit Type: First/Initial  Appt. Start Time: 1100 Appt. End Time: 1230  02/21/2019  Judith Chavez, identified by name and date of birth, is a 73 y.o. female with a diagnosis of Diabetes: Type 2. Patient is retired from [employer] as a Tourist information centre manager. She states she exercises daily between playing tennis and walking daily. Diet history indicates good variety of all food groups in appropriate portion sizes. She would like to know why her blood sugars are so high when her eating and exercise habits are appropriate?  ASSESSMENT  There were no vitals taken for this visit. There is no height or weight on file to calculate BMI.  Diabetes Self-Management Education - 02/20/19 1127      Visit Information   Visit Type  First/Initial      Initial Visit   Diabetes Type  Type 2    Are you currently following a meal plan?  No    Are you taking your medications as prescribed?  Yes    Date Diagnosed  2019      Health Coping   How would you rate your overall health?  Fair      Psychosocial Assessment   Patient Belief/Attitude about Diabetes  Denial   burned out   Self-care barriers  None    Other persons present  Patient    Patient Concerns  Nutrition/Meal planning;Glycemic Control;Medication;Monitoring    Special Needs  None    Learning Readiness  Change in progress    How often do you need to have someone help you when you read instructions, pamphlets, or other written materials from your doctor or pharmacy?  1 - Never    What is the last grade level you completed in school?  16 years      Pre-Education Assessment   Patient understands the diabetes disease and treatment process.  Needs Review    Patient understands incorporating nutritional management into lifestyle.  Needs Instruction    Patient undertands incorporating physical activity into lifestyle.  Needs Review    Patient understands using medications safely.  Needs Instruction    Patient  understands monitoring blood glucose, interpreting and using results  Needs Review    Patient understands prevention, detection, and treatment of acute complications.  Needs Instruction    Patient understands prevention, detection, and treatment of chronic complications.  Needs Instruction    Patient understands how to develop strategies to address psychosocial issues.  Needs Instruction    Patient understands how to develop strategies to promote health/change behavior.  Needs Instruction      Complications   Last HgB A1C per patient/outside source  8.3 %    How often do you check your blood sugar?  3-4 times/day    Fasting Blood glucose range (mg/dL)  180-200;>200    Postprandial Blood glucose range (mg/dL)  180-200;>200    Number of hypoglycemic episodes per month  0    Have you had a dilated eye exam in the past 12 months?  No    Have you had a dental exam in the past 12 months?  Yes      Dietary Intake   Breakfast  egg whites, cheddar cheese, soy bacon, occasionally with whole wheat toast and jelly    Snack (morning)  apple with PNB    Lunch  sandwich with cheese and tomato    Snack (afternoon)  cookies (4-5, no icing) OR chips    Dinner  grilled seafood, tossed  salad with lots of vegetables    Snack (evening)  cookie with glass of milk    Beverage(s)  coffee black, water, occasionally glass of wine (no juice or soda)      Exercise   Exercise Type  Light (walking / raking leaves)   walks and plays tennis (twice a week for 90+ minutes)   How many days per week to you exercise?  60    How many minutes per day do you exercise?  6    Total minutes per week of exercise  360      Patient Education   Previous Diabetes Education  No    Disease state   Definition of diabetes, type 1 and 2, and the diagnosis of diabetes;Factors that contribute to the development of diabetes    Nutrition management   Role of diet in the treatment of diabetes and the relationship between the three main  macronutrients and blood glucose level;Food label reading, portion sizes and measuring food.;Carbohydrate counting    Physical activity and exercise   Role of exercise on diabetes management, blood pressure control and cardiac health.    Medications  Reviewed patients medication for diabetes, action, purpose, timing of dose and side effects.    Monitoring  Identified appropriate SMBG and/or A1C goals.    Acute complications  Other (comment)    Chronic complications  Relationship between chronic complications and blood glucose control    Psychosocial adjustment  Role of stress on diabetes      Individualized Goals (developed by patient)   Nutrition  Follow meal plan discussed    Physical Activity  Exercise 3-5 times per week    Medications  take my medication as prescribed    Monitoring   test blood glucose pre and post meals as discussed      Post-Education Assessment   Patient understands the diabetes disease and treatment process.  Demonstrates understanding / competency    Patient understands incorporating nutritional management into lifestyle.  Demonstrates understanding / competency    Patient undertands incorporating physical activity into lifestyle.  Demonstrates understanding / competency    Patient understands using medications safely.  Demonstrates understanding / competency    Patient understands monitoring blood glucose, interpreting and using results  Demonstrates understanding / competency    Patient understands prevention, detection, and treatment of acute complications.  Demonstrates understanding / competency    Patient understands prevention, detection, and treatment of chronic complications.  Demonstrates understanding / competency    Patient understands how to develop strategies to address psychosocial issues.  Demonstrates understanding / competency    Patient understands how to develop strategies to promote health/change behavior.  Demonstrates understanding / competency       Outcomes   Expected Outcomes  Demonstrated interest in learning. Expect positive outcomes    Future DMSE  4-6 wks    Program Status  Not Completed       Individualized Plan for Diabetes Self-Management Training:   Learning Objective:  Patient will have a greater understanding of diabetes self-management. Patient education plan is to attend individual and/or group sessions per assessed needs and concerns.   Plan:   Patient Instructions  Plan:  Aim for 2-3 Carb Choices per meal (30-45 grams)  Aim for 0-1 Carbs per snack if hungry  Include protein in moderation with your meals and snacks Consider reading food labels for Total Carbohydrate of foods Continue with your activity level by walking and playing tennis for 30-60 minutes daily as tolerated  Continue checking BG at alternate times per day  Continue taking medication as directed by MD  I discussed the possibility of using insulin as a tool just to introduce the idea in case it is needed.   Expected Outcomes:  Demonstrated interest in learning. Expect positive outcomes  Education material provided: ADA - How to Thrive: A Guide for Your Journey with Diabetes, Food label handouts, A1C conversion sheet and Carbohydrate counting sheet  If problems or questions, patient to contact team via:  Phone  Future DSME appointment: 4-6 wks

## 2019-03-20 ENCOUNTER — Other Ambulatory Visit: Payer: Self-pay

## 2019-03-20 ENCOUNTER — Ambulatory Visit (INDEPENDENT_AMBULATORY_CARE_PROVIDER_SITE_OTHER): Payer: Medicare Other | Admitting: Orthopaedic Surgery

## 2019-03-20 ENCOUNTER — Encounter: Payer: Self-pay | Admitting: Orthopaedic Surgery

## 2019-03-20 VITALS — Ht 62.0 in | Wt 150.0 lb

## 2019-03-20 DIAGNOSIS — M1712 Unilateral primary osteoarthritis, left knee: Secondary | ICD-10-CM | POA: Insufficient documentation

## 2019-03-20 DIAGNOSIS — M1711 Unilateral primary osteoarthritis, right knee: Secondary | ICD-10-CM | POA: Diagnosis not present

## 2019-03-20 MED ORDER — BUPIVACAINE HCL 0.5 % IJ SOLN
2.0000 mL | INTRAMUSCULAR | Status: AC | PRN
  Administered 2019-03-20: 2 mL via INTRA_ARTICULAR

## 2019-03-20 MED ORDER — METHYLPREDNISOLONE ACETATE 40 MG/ML IJ SUSP
40.0000 mg | INTRAMUSCULAR | Status: AC | PRN
  Administered 2019-03-20: 40 mg via INTRA_ARTICULAR

## 2019-03-20 MED ORDER — LIDOCAINE HCL 1 % IJ SOLN
2.0000 mL | INTRAMUSCULAR | Status: AC | PRN
  Administered 2019-03-20: 2 mL

## 2019-03-20 NOTE — Progress Notes (Signed)
Office Visit Note   Patient: Judith Chavez           Date of Birth: 1945-09-10           MRN: [id number] Visit Date: 03/20/2019              Requested by: Mayra Neer, MD [address] [place],  [place] [address] PCP: Mayra Neer, MD   Assessment & Plan: Visit Diagnoses:  1. Primary osteoarthritis of left knee   2. Primary osteoarthritis of right knee     Plan: Bilateral knee injection performed today.  I aspirated 20 cc of clear joint fluid from the right knee.  Follow-up as needed.  Follow-Up Instructions: Return if symptoms worsen or fail to improve.   Orders:  No orders of the defined types were placed in this encounter.  No orders of the defined types were placed in this encounter.     Procedures: Large Joint Inj: bilateral knee on 03/20/2019 3:53 PM Indications: pain Details: 22 G needle  Arthrogram: No  Medications (Right): 2 mL lidocaine 1 %; 2 mL bupivacaine 0.5 %; 40 mg methylPREDNISolone acetate 40 MG/ML Aspirate (Right): 20 mL clear Medications (Left): 2 mL lidocaine 1 %; 2 mL bupivacaine 0.5 %; 40 mg methylPREDNISolone acetate 40 MG/ML Outcome: tolerated well, no immediate complications Patient was prepped and draped in the usual sterile fashion.       Clinical Data: No additional findings.   Subjective: Chief Complaint  Patient presents with  . Right Knee - Pain  . Left Knee - Pain    Ms. Stringfield comes in today for bilateral knee cortisone injections.  She has not done well from previous ones.  No changes in medical history.   Review of Systems   Objective: Vital Signs: Ht 5\' 2"  (1.575 m)   Wt 150 lb (68 kg)   BMI 27.44 kg/m   Physical Exam  Ortho Exam Right knee exam shows moderate joint effusion.  Valgus deformity.  No flexion contracture.  Otherwise unremarkable exam Left knee exam is unremarkable. Specialty Comments:  No specialty comments available.  Imaging: No results found.   PMFS History:  Patient Active Problem List   Diagnosis Date Noted  . Primary osteoarthritis of right knee 03/20/2019  . Primary osteoarthritis of left knee 03/20/2019  . Primary osteoarthritis of both knees 04/24/2017  . Spondylolisthesis at L3-L4 level 06/01/2015   Past Medical History:  Diagnosis Date  . Anxiety   . Arthritis    knees  . Depression   . Diabetes mellitus without complication (HCC)    Type 2  . Headache    migraines  . History of pneumonia   . Numbness    "left foot"   . Poor circulation     Family History  Problem Relation Age of Onset  . Breast cancer Cousin   . Breast cancer Cousin   . Breast cancer Cousin   . Breast cancer Cousin   . Breast cancer Cousin     Past Surgical History:  Procedure Laterality Date  . ABCESS DRAINAGE     "after cesarean- in hospital for 3 months"  . ABDOMINAL HYSTERECTOMY    . CESAREAN SECTION  1970  . CHOLECYSTECTOMY    . COLONOSCOPY    . EYE SURGERY Right   . MENISCUS REPAIR Bilateral   . SPINE SURGERY     Social History   Occupational History  . Not on file  Tobacco Use  . Smoking status:  Never Smoker  . Smokeless tobacco: Never Used  Substance and Sexual Activity  . Alcohol use: Yes    Comment: occassionally  . Drug use: No  . Sexual activity: Not on file

## 2019-04-03 ENCOUNTER — Encounter: Payer: Medicare Other | Attending: Family Medicine | Admitting: *Deleted

## 2019-04-03 ENCOUNTER — Other Ambulatory Visit: Payer: Self-pay

## 2019-04-03 DIAGNOSIS — E1165 Type 2 diabetes mellitus with hyperglycemia: Secondary | ICD-10-CM

## 2019-04-03 DIAGNOSIS — E119 Type 2 diabetes mellitus without complications: Secondary | ICD-10-CM | POA: Insufficient documentation

## 2019-04-03 DIAGNOSIS — IMO0002 Reserved for concepts with insufficient information to code with codable children: Secondary | ICD-10-CM

## 2019-04-03 NOTE — Patient Instructions (Addendum)
   We reviewed your BG's today, they continue to run in the 200's before meals and over 300 after meals and even with exercise.  I reviewed the benefit of taking insulin if your body cannot make enough for you  I explained to you that sometimes a single dose of long acting insulin works well.  You were able to give a sample injection with insulin pen needle so you would know what it felt like  You are willing to consider taking insulin as different tool to help manage your blood sugars better  Discuss with your Dr, that you are having significant leg pains and cramping during the night  Continue with your healthy eating habits and exercise!  We weighed you today, you have lost about 7 pounds since I saw you in August 1636.2 lbs

## 2019-04-03 NOTE — Progress Notes (Signed)
Diabetes Self-Management Education  Visit Type:  Follow-up  Appt. Start Time: 0940 Appt. End Time: [id number]  04/03/2019  Judith Chavez, identified by name and date of birth, is a 73 y.o. female with a diagnosis of Diabetes: Type 2.  Patient is very concerned about her rising blood sugars while eating appropriately and exercising daily along with taking medications as prescribed. She is also complaining of leg cramps during the night, not improved with mustard or pickle juice. She is very frustrated and becoming concerned that her blood sugars are consistently in the 200's before meals and over 300 after meals.   ASSESSMENT  Weight 163 lb (73.9 kg). Body mass index is 29.81 kg/m.   Diabetes Self-Management Education - 04/03/19 1250      Psychosocial Assessment   Patient Belief/Attitude about Diabetes  Defeat/Burnout    Self-care barriers  None    Self-management support  CDE visits    Patient Concerns  Medication;Glycemic Control    Special Needs  None    Learning Readiness  Change in progress      Complications   How often do you check your blood sugar?  3-4 times/day    Fasting Blood glucose range (mg/dL)  >200    Postprandial Blood glucose range (mg/dL)  >200    Number of hypoglycemic episodes per month  0      Exercise   Exercise Type  Light (walking / raking leaves)    How many days per week to you exercise?  5    How many minutes per day do you exercise?  30    Total minutes per week of exercise  150      Patient Education   Previous Diabetes Education  Yes (please comment)    Medications  Reviewed patients medication for diabetes, action, purpose, timing of dose and side effects.;Other (comment)   spent more time discussing rationale for using insulin if her body is not able to make enough     Individualized Goals (developed by patient)   Medications  take my medication as prescribed;Other (comment)   discuss current concerns with MD and willingness to take insulin  if needed   Problem Solving  current blood sugars are getting worse even with added diabetes medications while patient is eating appropriately and exercising.      Patient Self-Evaluation of Goals - Patient rates self as meeting previously set goals (% of time)   Nutrition  >75%    Physical Activity  >75%    Medications  >75%    Monitoring  >75%      Outcomes   Program Status  Not Completed      Subsequent Visit   Since your last visit have you continued or begun to take your medications as prescribed?  Yes    Since your last visit have you experienced any weight changes?  Loss    Weight Loss (lbs)  7    Since your last visit, are you checking your blood glucose at least once a day?  Yes      Learning Objective:  Patient will have a greater understanding of diabetes self-management. Patient education plan is to attend individual and/or group sessions per assessed needs and concerns.  Plan:   Patient Instructions   We reviewed your BG's today, they continue to run in the 200's before meals and over 300 after meals and even with exercise.  I reviewed the benefit of taking insulin if your body cannot make enough for  you  I explained to you that sometimes a single dose of long acting insulin works well.  You were able to give a sample injection with insulin pen needle so you would know what it felt like  You are willing to consider taking insulin as different tool to help manage your blood sugars better  Discuss with your Dr, that you are having significant leg pains and cramping during the night  Continue with your healthy eating habits and exercise!  We weighed you today, you have lost about 7 pounds since I saw you in August 1636.2 lbs  Expected Outcomes:  Demonstrated interest in learning. Expect positive outcomes  Education material provided: A1C conversion sheet, Insulin Action handout, insulin rotation handout  If problems or questions, patient to contact team via:   Phone  Future DSME appointment: - 2 months

## 2019-05-01 DIAGNOSIS — E1165 Type 2 diabetes mellitus with hyperglycemia: Secondary | ICD-10-CM | POA: Diagnosis not present

## 2019-05-01 DIAGNOSIS — Z7984 Long term (current) use of oral hypoglycemic drugs: Secondary | ICD-10-CM | POA: Diagnosis not present

## 2019-05-01 DIAGNOSIS — E559 Vitamin D deficiency, unspecified: Secondary | ICD-10-CM | POA: Diagnosis not present

## 2019-05-07 DIAGNOSIS — M8580 Other specified disorders of bone density and structure, unspecified site: Secondary | ICD-10-CM | POA: Diagnosis not present

## 2019-05-07 DIAGNOSIS — N952 Postmenopausal atrophic vaginitis: Secondary | ICD-10-CM | POA: Diagnosis not present

## 2019-05-07 DIAGNOSIS — F322 Major depressive disorder, single episode, severe without psychotic features: Secondary | ICD-10-CM | POA: Diagnosis not present

## 2019-05-07 DIAGNOSIS — E1169 Type 2 diabetes mellitus with other specified complication: Secondary | ICD-10-CM | POA: Diagnosis not present

## 2019-05-07 DIAGNOSIS — Z0000 Encounter for general adult medical examination without abnormal findings: Secondary | ICD-10-CM | POA: Diagnosis not present

## 2019-05-07 DIAGNOSIS — A6000 Herpesviral infection of urogenital system, unspecified: Secondary | ICD-10-CM | POA: Diagnosis not present

## 2019-05-07 DIAGNOSIS — Z7189 Other specified counseling: Secondary | ICD-10-CM | POA: Diagnosis not present

## 2019-05-07 DIAGNOSIS — G4700 Insomnia, unspecified: Secondary | ICD-10-CM | POA: Diagnosis not present

## 2019-05-07 DIAGNOSIS — E559 Vitamin D deficiency, unspecified: Secondary | ICD-10-CM | POA: Diagnosis not present

## 2019-05-07 DIAGNOSIS — I10 Essential (primary) hypertension: Secondary | ICD-10-CM | POA: Diagnosis not present

## 2019-05-07 DIAGNOSIS — M549 Dorsalgia, unspecified: Secondary | ICD-10-CM | POA: Diagnosis not present

## 2019-05-07 DIAGNOSIS — M1990 Unspecified osteoarthritis, unspecified site: Secondary | ICD-10-CM | POA: Diagnosis not present

## 2019-05-18 DIAGNOSIS — Z23 Encounter for immunization: Secondary | ICD-10-CM | POA: Diagnosis not present

## 2019-06-11 ENCOUNTER — Ambulatory Visit
Admission: RE | Admit: 2019-06-11 | Discharge: 2019-06-11 | Disposition: A | Discharge status: Home / Self Care | Payer: Medicare Other | Source: Ambulatory Visit | Attending: Family Medicine | Admitting: Family Medicine

## 2019-06-11 ENCOUNTER — Other Ambulatory Visit: Payer: Self-pay | Admitting: Family Medicine

## 2019-06-11 DIAGNOSIS — R29898 Other symptoms and signs involving the musculoskeletal system: Secondary | ICD-10-CM

## 2019-06-11 DIAGNOSIS — M47812 Spondylosis without myelopathy or radiculopathy, cervical region: Secondary | ICD-10-CM | POA: Diagnosis not present

## 2019-06-11 DIAGNOSIS — F322 Major depressive disorder, single episode, severe without psychotic features: Secondary | ICD-10-CM | POA: Diagnosis not present

## 2019-06-11 DIAGNOSIS — R202 Paresthesia of skin: Secondary | ICD-10-CM | POA: Diagnosis not present

## 2019-06-11 DIAGNOSIS — Z794 Long term (current) use of insulin: Secondary | ICD-10-CM | POA: Diagnosis not present

## 2019-06-11 DIAGNOSIS — E1169 Type 2 diabetes mellitus with other specified complication: Secondary | ICD-10-CM | POA: Diagnosis not present

## 2019-06-20 ENCOUNTER — Other Ambulatory Visit: Payer: Self-pay | Admitting: Family Medicine

## 2019-06-20 DIAGNOSIS — R202 Paresthesia of skin: Secondary | ICD-10-CM

## 2019-07-01 ENCOUNTER — Ambulatory Visit
Admission: RE | Admit: 2019-07-01 | Discharge: 2019-07-01 | Disposition: A | Discharge status: Home / Self Care | Payer: Medicare Other | Source: Ambulatory Visit | Attending: Family Medicine | Admitting: Family Medicine

## 2019-07-01 ENCOUNTER — Other Ambulatory Visit: Payer: Self-pay

## 2019-07-01 DIAGNOSIS — R202 Paresthesia of skin: Secondary | ICD-10-CM

## 2019-07-11 ENCOUNTER — Encounter: Payer: Self-pay | Admitting: Family Medicine

## 2019-07-11 ENCOUNTER — Ambulatory Visit (INDEPENDENT_AMBULATORY_CARE_PROVIDER_SITE_OTHER): Payer: Medicare Other | Admitting: Family Medicine

## 2019-07-11 ENCOUNTER — Other Ambulatory Visit: Payer: Self-pay

## 2019-07-11 DIAGNOSIS — R200 Anesthesia of skin: Secondary | ICD-10-CM

## 2019-07-11 DIAGNOSIS — R202 Paresthesia of skin: Secondary | ICD-10-CM | POA: Diagnosis not present

## 2019-07-11 DIAGNOSIS — M4302 Spondylolysis, cervical region: Secondary | ICD-10-CM

## 2019-07-11 MED ORDER — GABAPENTIN 100 MG PO CAPS: ORAL_CAPSULE | ORAL | 3 refills | Status: DC

## 2019-07-11 MED ORDER — BACLOFEN 10 MG PO TABS: 5.0000 mg | ORAL_TABLET | Freq: Every evening | ORAL | 3 refills | Status: DC | PRN

## 2019-07-11 NOTE — Progress Notes (Signed)
Office Visit Note   Patient: Judith Chavez           Date of Birth: 1946/02/28           MRN: [id number] Visit Date: 07/11/2019 Requested by: Mayra Neer, MD [address] [place],  [place] [address] PCP: Mayra Neer, MD  Subjective: Chief Complaint  Patient presents with  . tingling in right arm/hand x several weeks    HPI: She is here with neck and right arm pain.  Symptoms started about 3 or 4 weeks ago, no definite injury.  She started noticing pain in the right side of her neck with numbness and tingling down her arm into her hand.  Now she is having intermittent pain and tingling in the left arm as well.  The other day she had a random severe pain in her right foot but that seems to have resolved.  She was given intramuscular neuroid injection by her PCP which gave her improvement in pain.  She recently had x-rays and an MRI scan which I reviewed on computer with her today showing multilevel spondylosis with moderate to severe right sided C5-6 foraminal stenosis, moderate bilateral C6-7, and moderate left and severe right C7-T1 foraminal stenosis.  She is right-hand dominant.  She likes to play tennis but has stopped recently due to this pain.               ROS: No fevers or chills.  All other systems were reviewed and are negative.  Objective: Vital Signs: There were no vitals taken for this visit.  Physical Exam:  General:  Alert and oriented, in no acute distress. Pulm:  Breathing unlabored. Psy:  Normal mood, congruent affect. Skin: No visible rash. Neck: She has almost full range of motion but pain at the extreme of side bending bilaterally.  Spurling's test is positive on the right and left.  Full range of motion of both shoulders with 5/5 deltoid, rotator cuff, biceps, triceps, wrist and intrinsic hand strength on the left but slight weakness with grip strength on the right.  2+ biceps, triceps, brachioradialis DTRs.  Imaging: None  today.  Assessment & Plan: 1.  Right greater than left arm numbness and tingling and multifactorial spondylosis and foraminal stenosis on MRI scan. -We will try, baclofen at referral to [facility] physical therapy. -Could consider pain becomes severe, surgical consult.     Procedures: No procedures performed  No notes on file     PMFS History: Patient Active Problem List   Diagnosis Date Noted  . Primary osteoarthritis of right knee 03/20/2019  . Primary osteoarthritis of left knee 03/20/2019  . Primary osteoarthritis of both knees 04/24/2017  . Spondylolisthesis at L3-L4 level 06/01/2015   Past Medical History:  Diagnosis Date  . Anxiety   . Arthritis    knees  . Depression   . Diabetes mellitus without complication (HCC)    Type 2  . Headache    migraines  . History of pneumonia   . Numbness    "left foot"   . Poor circulation     Family History  Problem Relation Age of Onset  . Breast cancer Cousin   . Breast cancer Cousin   . Breast cancer Cousin   . Breast cancer Cousin   . Breast cancer Cousin     Past Surgical History:  Procedure Laterality Date  . ABCESS DRAINAGE     "after cesarean- in hospital for 3 months"  . ABDOMINAL HYSTERECTOMY    .  CESAREAN SECTION  1970  . CHOLECYSTECTOMY    . COLONOSCOPY    . EYE SURGERY Right   . MENISCUS REPAIR Bilateral   . SPINE SURGERY     Social History   Occupational History  . Not on file  Tobacco Use  . Smoking status: Never Smoker  . Smokeless tobacco: Never Used  Substance and Sexual Activity  . Alcohol use: Yes    Comment: occassionally  . Drug use: No  . Sexual activity: Not on file

## 2019-07-15 ENCOUNTER — Other Ambulatory Visit: Payer: Medicare Other

## 2019-07-26 DIAGNOSIS — M542 Cervicalgia: Secondary | ICD-10-CM | POA: Diagnosis not present

## 2019-07-29 DIAGNOSIS — M542 Cervicalgia: Secondary | ICD-10-CM | POA: Diagnosis not present

## 2019-08-01 DIAGNOSIS — M542 Cervicalgia: Secondary | ICD-10-CM | POA: Diagnosis not present

## 2019-08-02 ENCOUNTER — Ambulatory Visit: Payer: Medicare Other | Attending: Internal Medicine

## 2019-08-02 DIAGNOSIS — Z23 Encounter for immunization: Secondary | ICD-10-CM

## 2019-08-02 NOTE — Progress Notes (Signed)
   Covid-19 Vaccination Clinic  Name:  Rubia Colclough    MRN: [id number] DOB: 1946-01-29  08/02/2019  Ms. Burker was observed post Covid-19 immunization for 15 minutes without incidence. She was provided with Vaccine Information Sheet and instruction to access the V-Safe system.   Ms. Week was instructed to call 911 with any severe reactions post vaccine: Marland Kitchen Difficulty breathing  . Swelling of your face and throat  . A fast heartbeat  . A bad rash all over your body  . Dizziness and weakness    Immunizations Administered    Name Date Dose VIS Date Route   Pfizer COVID-19 Vaccine 08/02/2019  4:50 PM 0.3 mL 06/21/2019 Intramuscular   Manufacturer: [place]   Lot: [id number]   [place]: [id number]

## 2019-08-07 DIAGNOSIS — M542 Cervicalgia: Secondary | ICD-10-CM | POA: Diagnosis not present

## 2019-08-15 DIAGNOSIS — M542 Cervicalgia: Secondary | ICD-10-CM | POA: Diagnosis not present

## 2019-08-19 DIAGNOSIS — E559 Vitamin D deficiency, unspecified: Secondary | ICD-10-CM | POA: Diagnosis not present

## 2019-08-19 DIAGNOSIS — F322 Major depressive disorder, single episode, severe without psychotic features: Secondary | ICD-10-CM | POA: Diagnosis not present

## 2019-08-19 DIAGNOSIS — L853 Xerosis cutis: Secondary | ICD-10-CM | POA: Diagnosis not present

## 2019-08-19 DIAGNOSIS — M9981 Other biomechanical lesions of cervical region: Secondary | ICD-10-CM | POA: Diagnosis not present

## 2019-08-19 DIAGNOSIS — Z7189 Other specified counseling: Secondary | ICD-10-CM | POA: Diagnosis not present

## 2019-08-19 DIAGNOSIS — E1169 Type 2 diabetes mellitus with other specified complication: Secondary | ICD-10-CM | POA: Diagnosis not present

## 2019-08-20 DIAGNOSIS — E559 Vitamin D deficiency, unspecified: Secondary | ICD-10-CM | POA: Diagnosis not present

## 2019-08-20 DIAGNOSIS — L853 Xerosis cutis: Secondary | ICD-10-CM | POA: Diagnosis not present

## 2019-08-20 DIAGNOSIS — E1169 Type 2 diabetes mellitus with other specified complication: Secondary | ICD-10-CM | POA: Diagnosis not present

## 2019-08-21 ENCOUNTER — Ambulatory Visit: Payer: Medicare Other | Attending: Internal Medicine

## 2019-08-21 DIAGNOSIS — M542 Cervicalgia: Secondary | ICD-10-CM | POA: Diagnosis not present

## 2019-08-21 DIAGNOSIS — Z23 Encounter for immunization: Secondary | ICD-10-CM

## 2019-08-21 NOTE — Progress Notes (Signed)
   Covid-19 Vaccination Clinic  Name:  Judith Chavez    MRN: [id number] DOB: 1945-08-18  08/21/2019  Judith Chavez was observed post Covid-19 immunization for 15 minutes without incidence. She was provided with Vaccine Information Sheet and instruction to access the V-Safe system.   Judith Chavez was instructed to call 911 with any severe reactions post vaccine: Marland Kitchen Difficulty breathing  . Swelling of your face and throat  . A fast heartbeat  . A bad rash all over your body  . Dizziness and weakness    Immunizations Administered    Name Date Dose VIS Date Route   Pfizer COVID-19 Vaccine 08/21/2019 11:32 AM 0.3 mL 06/21/2019 Intramuscular   Manufacturer: [place]   Lot: [id number]   [place]: [id number]

## 2019-09-11 DIAGNOSIS — M542 Cervicalgia: Secondary | ICD-10-CM | POA: Diagnosis not present

## 2019-10-15 DIAGNOSIS — E119 Type 2 diabetes mellitus without complications: Secondary | ICD-10-CM | POA: Diagnosis not present

## 2019-10-18 DIAGNOSIS — H2513 Age-related nuclear cataract, bilateral: Secondary | ICD-10-CM | POA: Diagnosis not present

## 2019-10-18 DIAGNOSIS — H35342 Macular cyst, hole, or pseudohole, left eye: Secondary | ICD-10-CM | POA: Diagnosis not present

## 2019-10-25 DIAGNOSIS — E113293 Type 2 diabetes mellitus with mild nonproliferative diabetic retinopathy without macular edema, bilateral: Secondary | ICD-10-CM | POA: Diagnosis not present

## 2019-10-25 DIAGNOSIS — H2513 Age-related nuclear cataract, bilateral: Secondary | ICD-10-CM | POA: Diagnosis not present

## 2019-10-25 DIAGNOSIS — Z9889 Other specified postprocedural states: Secondary | ICD-10-CM | POA: Diagnosis not present

## 2019-10-25 DIAGNOSIS — H35342 Macular cyst, hole, or pseudohole, left eye: Secondary | ICD-10-CM | POA: Diagnosis not present

## 2019-11-12 DIAGNOSIS — E119 Type 2 diabetes mellitus without complications: Secondary | ICD-10-CM | POA: Diagnosis not present

## 2019-11-21 DIAGNOSIS — F322 Major depressive disorder, single episode, severe without psychotic features: Secondary | ICD-10-CM | POA: Diagnosis not present

## 2019-11-21 DIAGNOSIS — E559 Vitamin D deficiency, unspecified: Secondary | ICD-10-CM | POA: Diagnosis not present

## 2019-11-21 DIAGNOSIS — E1169 Type 2 diabetes mellitus with other specified complication: Secondary | ICD-10-CM | POA: Diagnosis not present

## 2019-11-21 DIAGNOSIS — G4700 Insomnia, unspecified: Secondary | ICD-10-CM | POA: Diagnosis not present

## 2019-12-17 DIAGNOSIS — E119 Type 2 diabetes mellitus without complications: Secondary | ICD-10-CM | POA: Diagnosis not present

## 2020-01-03 ENCOUNTER — Ambulatory Visit (INDEPENDENT_AMBULATORY_CARE_PROVIDER_SITE_OTHER): Payer: Medicare Other | Admitting: Orthopaedic Surgery

## 2020-01-03 ENCOUNTER — Encounter: Payer: Self-pay | Admitting: Orthopaedic Surgery

## 2020-01-03 ENCOUNTER — Ambulatory Visit (INDEPENDENT_AMBULATORY_CARE_PROVIDER_SITE_OTHER): Payer: Medicare Other

## 2020-01-03 VITALS — Ht 62.0 in | Wt 163.0 lb

## 2020-01-03 DIAGNOSIS — G8929 Other chronic pain: Secondary | ICD-10-CM

## 2020-01-03 DIAGNOSIS — M25561 Pain in right knee: Secondary | ICD-10-CM | POA: Diagnosis not present

## 2020-01-03 NOTE — Progress Notes (Signed)
   Office Visit Note   Patient: Judith Chavez           Date of Birth: January 13, 1946           MRN: [id number] Visit Date: 01/03/2020              Requested by: Mayra Neer, MD [address] [place],  [place] [address] PCP: Mayra Neer, MD   Assessment & Plan: Visit Diagnoses:  1. Chronic pain of right knee     Plan: Impression is a right knee OA exacerbation.  Aspiration and injection performed today.  We obtained approximately 10 cc of joint fluid.  Patient tolerated the procedure well and will follow up as needed.  Follow-Up Instructions: Return if symptoms worsen or fail to improve.   Orders:  Orders Placed This Encounter  Procedures  . XR KNEE 3 VIEW RIGHT   No orders of the defined types were placed in this encounter.     Procedures: No procedures performed   Clinical Data: No additional findings.   Subjective: Chief Complaint  Patient presents with  . Right Knee - Pain    Darshana returns today for chronic right knee pain.  She has no known injuries.  She feels that the knee has swelled up again.  Not taking any medications currently.   Review of Systems   Objective: Vital Signs: Ht 5\' 2"  (1.575 m)   Wt 163 lb (73.9 kg)   BMI 29.81 kg/m   Physical Exam  Ortho Exam Right knee exam shows a small effusion.  Painful range of motion with crepitus. Specialty Comments:  No specialty comments available.  Imaging: XR KNEE 3 VIEW RIGHT  Result Date: 01/03/2020 Advanced tricompartmental DJD.    PMFS History: Patient Active Problem List   Diagnosis Date Noted  . Primary osteoarthritis of right knee 03/20/2019  . Primary osteoarthritis of left knee 03/20/2019  . Primary osteoarthritis of both knees 04/24/2017  . Spondylolisthesis at L3-L4 level 06/01/2015   Past Medical History:  Diagnosis Date  . Anxiety   . Arthritis    knees  . Depression   . Diabetes mellitus without complication (HCC)    Type 2  . Headache     migraines  . History of pneumonia   . Numbness    "left foot"   . Poor circulation     Family History  Problem Relation Age of Onset  . Breast cancer Cousin   . Breast cancer Cousin   . Breast cancer Cousin   . Breast cancer Cousin   . Breast cancer Cousin     Past Surgical History:  Procedure Laterality Date  . ABCESS DRAINAGE     "after cesarean- in hospital for 3 months"  . ABDOMINAL HYSTERECTOMY    . CESAREAN SECTION  1970  . CHOLECYSTECTOMY    . COLONOSCOPY    . EYE SURGERY Right   . MENISCUS REPAIR Bilateral   . SPINE SURGERY     Social History   Occupational History  . Not on file  Tobacco Use  . Smoking status: Never Smoker  . Smokeless tobacco: Never Used  Substance and Sexual Activity  . Alcohol use: Yes    Comment: occassionally  . Drug use: No  . Sexual activity: Not on file

## 2020-01-10 ENCOUNTER — Other Ambulatory Visit: Payer: Self-pay | Admitting: Family Medicine

## 2020-01-10 DIAGNOSIS — Z1231 Encounter for screening mammogram for malignant neoplasm of breast: Secondary | ICD-10-CM

## 2020-01-24 ENCOUNTER — Ambulatory Visit: Payer: Medicare Other

## 2020-01-24 ENCOUNTER — Other Ambulatory Visit: Payer: Self-pay

## 2020-01-24 ENCOUNTER — Ambulatory Visit
Admission: RE | Admit: 2020-01-24 | Discharge: 2020-01-24 | Disposition: A | Discharge status: Home / Self Care | Payer: Medicare Other | Source: Ambulatory Visit | Attending: Family Medicine | Admitting: Family Medicine

## 2020-01-24 DIAGNOSIS — Z1231 Encounter for screening mammogram for malignant neoplasm of breast: Secondary | ICD-10-CM

## 2020-01-29 ENCOUNTER — Other Ambulatory Visit: Payer: Self-pay | Admitting: Family Medicine

## 2020-01-29 DIAGNOSIS — R928 Other abnormal and inconclusive findings on diagnostic imaging of breast: Secondary | ICD-10-CM

## 2020-02-13 ENCOUNTER — Ambulatory Visit
Admission: RE | Admit: 2020-02-13 | Discharge: 2020-02-13 | Disposition: A | Discharge status: Home / Self Care | Payer: Medicare Other | Source: Ambulatory Visit | Attending: Family Medicine | Admitting: Family Medicine

## 2020-02-13 ENCOUNTER — Other Ambulatory Visit: Payer: Self-pay | Admitting: Family Medicine

## 2020-02-13 ENCOUNTER — Other Ambulatory Visit: Payer: Self-pay

## 2020-02-13 DIAGNOSIS — R928 Other abnormal and inconclusive findings on diagnostic imaging of breast: Secondary | ICD-10-CM

## 2020-02-13 DIAGNOSIS — R921 Mammographic calcification found on diagnostic imaging of breast: Secondary | ICD-10-CM | POA: Diagnosis not present

## 2020-02-17 ENCOUNTER — Ambulatory Visit
Admission: RE | Admit: 2020-02-17 | Discharge: 2020-02-17 | Disposition: A | Discharge status: Home / Self Care | Payer: Medicare Other | Source: Ambulatory Visit | Attending: Family Medicine | Admitting: Family Medicine

## 2020-02-17 ENCOUNTER — Other Ambulatory Visit: Payer: Self-pay | Admitting: Family Medicine

## 2020-02-17 ENCOUNTER — Other Ambulatory Visit: Payer: Self-pay

## 2020-02-17 DIAGNOSIS — R928 Other abnormal and inconclusive findings on diagnostic imaging of breast: Secondary | ICD-10-CM

## 2020-02-17 DIAGNOSIS — R921 Mammographic calcification found on diagnostic imaging of breast: Secondary | ICD-10-CM | POA: Diagnosis not present

## 2020-02-17 DIAGNOSIS — D0511 Intraductal carcinoma in situ of right breast: Secondary | ICD-10-CM | POA: Diagnosis not present

## 2020-02-25 ENCOUNTER — Other Ambulatory Visit: Payer: Self-pay | Admitting: Surgery

## 2020-02-25 DIAGNOSIS — D0501 Lobular carcinoma in situ of right breast: Secondary | ICD-10-CM | POA: Diagnosis not present

## 2020-02-25 DIAGNOSIS — D0511 Intraductal carcinoma in situ of right breast: Secondary | ICD-10-CM | POA: Diagnosis not present

## 2020-02-26 ENCOUNTER — Other Ambulatory Visit: Payer: Self-pay | Admitting: Surgery

## 2020-02-26 ENCOUNTER — Encounter: Payer: Self-pay | Admitting: Adult Health

## 2020-02-26 DIAGNOSIS — C50411 Malignant neoplasm of upper-outer quadrant of right female breast: Secondary | ICD-10-CM | POA: Insufficient documentation

## 2020-02-26 DIAGNOSIS — D0501 Lobular carcinoma in situ of right breast: Secondary | ICD-10-CM

## 2020-02-26 DIAGNOSIS — D0511 Intraductal carcinoma in situ of right breast: Secondary | ICD-10-CM | POA: Insufficient documentation

## 2020-02-27 ENCOUNTER — Telehealth: Payer: Self-pay | Admitting: Hematology

## 2020-02-27 ENCOUNTER — Telehealth: Payer: Self-pay | Admitting: Genetic Counselor

## 2020-02-27 NOTE — Telephone Encounter (Signed)
A genetic counseling appt has been scheduled for Judith Chavez to [facility] on 8/20 at 12pm.

## 2020-02-27 NOTE — Telephone Encounter (Signed)
Received a new pt referral from Dr. Ninfa Linden at [facility] for new dx of breast cancer. Judith Chavez has been cld and scheduled to see Dr. Burr Medico on 8/20 at 11am. [name] is aware to arrive 15 minutes early.

## 2020-02-28 ENCOUNTER — Encounter: Payer: Self-pay | Admitting: Hematology

## 2020-02-28 ENCOUNTER — Other Ambulatory Visit: Payer: Self-pay

## 2020-02-28 ENCOUNTER — Inpatient Hospital Stay: Payer: Medicare Other | Attending: Hematology

## 2020-02-28 ENCOUNTER — Encounter: Payer: Self-pay | Admitting: Genetic Counselor

## 2020-02-28 ENCOUNTER — Inpatient Hospital Stay (HOSPITAL_BASED_OUTPATIENT_CLINIC_OR_DEPARTMENT_OTHER): Payer: Medicare Other | Admitting: Hematology

## 2020-02-28 ENCOUNTER — Other Ambulatory Visit: Payer: Self-pay | Admitting: Genetic Counselor

## 2020-02-28 ENCOUNTER — Inpatient Hospital Stay (HOSPITAL_BASED_OUTPATIENT_CLINIC_OR_DEPARTMENT_OTHER): Payer: Medicare Other | Admitting: Genetic Counselor

## 2020-02-28 VITALS — BP 145/85 | HR 71 | Temp 97.8°F | Resp 18 | Ht 62.0 in | Wt 157.8 lb

## 2020-02-28 DIAGNOSIS — Z170 Estrogen receptor positive status [ER+]: Secondary | ICD-10-CM | POA: Diagnosis not present

## 2020-02-28 DIAGNOSIS — E119 Type 2 diabetes mellitus without complications: Secondary | ICD-10-CM | POA: Insufficient documentation

## 2020-02-28 DIAGNOSIS — Z794 Long term (current) use of insulin: Secondary | ICD-10-CM

## 2020-02-28 DIAGNOSIS — M19012 Primary osteoarthritis, left shoulder: Secondary | ICD-10-CM | POA: Insufficient documentation

## 2020-02-28 DIAGNOSIS — H5789 Other specified disorders of eye and adnexa: Secondary | ICD-10-CM

## 2020-02-28 DIAGNOSIS — F329 Major depressive disorder, single episode, unspecified: Secondary | ICD-10-CM | POA: Diagnosis not present

## 2020-02-28 DIAGNOSIS — D0511 Intraductal carcinoma in situ of right breast: Secondary | ICD-10-CM

## 2020-02-28 DIAGNOSIS — M170 Bilateral primary osteoarthritis of knee: Secondary | ICD-10-CM

## 2020-02-28 DIAGNOSIS — F419 Anxiety disorder, unspecified: Secondary | ICD-10-CM | POA: Insufficient documentation

## 2020-02-28 DIAGNOSIS — C50411 Malignant neoplasm of upper-outer quadrant of right female breast: Secondary | ICD-10-CM

## 2020-02-28 DIAGNOSIS — M1990 Unspecified osteoarthritis, unspecified site: Secondary | ICD-10-CM | POA: Diagnosis not present

## 2020-02-28 DIAGNOSIS — Z803 Family history of malignant neoplasm of breast: Secondary | ICD-10-CM

## 2020-02-28 DIAGNOSIS — M19011 Primary osteoarthritis, right shoulder: Secondary | ICD-10-CM | POA: Diagnosis not present

## 2020-02-28 HISTORY — DX: Family history of malignant neoplasm of breast: Z80.3

## 2020-02-28 LAB — CBC WITH DIFFERENTIAL (CANCER CENTER ONLY)
Abs Immature Granulocytes: 0.03 10*3/uL (ref 0.00–0.07)
Basophils Absolute: 0.1 10*3/uL (ref 0.0–0.1)
Basophils Relative: 1 %
Eosinophils Absolute: 0.1 10*3/uL (ref 0.0–0.5)
Eosinophils Relative: 2 %
HCT: 44.7 % (ref 36.0–46.0)
Hemoglobin: 13.8 g/dL (ref 12.0–15.0)
Immature Granulocytes: 1 %
Lymphocytes Relative: 38 %
Lymphs Abs: 2.5 10*3/uL (ref 0.7–4.0)
MCH: 26.5 pg (ref 26.0–34.0)
MCHC: 30.9 g/dL (ref 30.0–36.0)
MCV: 85.8 fL (ref 80.0–100.0)
Monocytes Absolute: 0.5 10*3/uL (ref 0.1–1.0)
Monocytes Relative: 8 %
Neutro Abs: 3.4 10*3/uL (ref 1.7–7.7)
Neutrophils Relative %: 50 %
Platelet Count: 209 10*3/uL (ref 150–400)
RBC: 5.21 MIL/uL — ABNORMAL HIGH (ref 3.87–5.11)
RDW: 14 % (ref 11.5–15.5)
WBC Count: 6.6 10*3/uL (ref 4.0–10.5)
nRBC: 0 % (ref 0.0–0.2)

## 2020-02-28 LAB — CMP (CANCER CENTER ONLY)
ALT: 13 U/L (ref 0–44)
AST: 18 U/L (ref 15–41)
Albumin: 3.8 g/dL (ref 3.5–5.0)
Alkaline Phosphatase: 77 U/L (ref 38–126)
Anion gap: 10 (ref 5–15)
BUN: 13 mg/dL (ref 8–23)
CO2: 26 mmol/L (ref 22–32)
Calcium: 9.9 mg/dL (ref 8.9–10.3)
Chloride: 107 mmol/L (ref 98–111)
Creatinine: 0.73 mg/dL (ref 0.44–1.00)
GFR, Est AFR Am: >60 mL/min (ref >60)
GFR, Estimated: >60 mL/min (ref >60)
Glucose, Bld: 75 mg/dL (ref 70–99)
Potassium: 4.5 mmol/L (ref 3.5–5.1)
Sodium: 143 mmol/L (ref 135–145)
Total Bilirubin: 0.4 mg/dL (ref 0.3–1.2)
Total Protein: 7.7 g/dL (ref 6.5–8.1)

## 2020-02-28 LAB — GENETIC SCREENING ORDER

## 2020-02-28 NOTE — Progress Notes (Signed)
[place]   [phone] Fax:([phone]) [place] Note   Patient Care Team: Mayra Neer, MD as PCP - General (Family Medicine)  Date of Service:  02/28/2020   CHIEF COMPLAINTS/PURPOSE OF CONSULTATION:  Newly diagnosed right breast LCIS  REFERRING PHYSICIAN:  Dr Ninfa Linden  Oncology History Overview Note  Cancer Staging Malignant neoplasm of upper-outer quadrant of right breast in female, estrogen receptor positive ([place]) Staging form: Breast, AJCC 8th Edition - Clinical stage from 02/17/2020: Stage 0 (cTis (DCIS), cN0, cM0, ER+, PR+) - Signed by Gardenia Phlegm, NP on 02/26/2020    Malignant neoplasm of upper-outer quadrant of right breast in female, estrogen receptor positive ([place])  02/13/2020 Mammogram   IMPRESSION: Highly suspicious calcifications within the upper-outer quadrant of the RIGHT breast, measuring 2.8 cm extent, for which stereotactic biopsy is recommended.   02/17/2020 Cancer Staging   Staging form: Breast, AJCC 8th Edition - Clinical stage from 02/17/2020: Stage 0 (cTis (DCIS), cN0, cM0, ER+, PR+) - Signed by Gardenia Phlegm, NP on 02/26/2020   02/17/2020 Initial Biopsy   Diagnosis Breast, right, needle core biopsy, lateral - DUCTAL CARCINOMA IN SITU, HIGH-GRADE WITH NECROSIS AND CALCIFICATIONS. SEE NOTE - LOBULAR CARCINOMA IN SITU Diagnosis Note DCIS measures 0.5 cm in greatest linear dimension. Dr. Saralyn Pilar reviewed the case and concurs with the diagnosis. A breast prognostic profile (ER, PR) is pending and will be reported in an addendum. [facility] was notified on 02/18/2020.   02/17/2020 Receptors her2   PROGNOSTIC INDICATORS Results: IMMUNOHISTOCHEMICAL AND MORPHOMETRIC ANALYSIS PERFORMED MANUALLY Estrogen Receptor: 95%, POSITIVE, MODERATE STAINING INTENSITY Progesterone Receptor: 70%, POSITIVE, MODERATE STAINING INTENSITY   02/26/2020 Initial Diagnosis   Malignant  neoplasm of upper-outer quadrant of right breast in female, estrogen receptor positive ([place])      HISTORY OF PRESENTING ILLNESS:  Judith Chavez 74 y.o. female is a here because of newly diagnosed right breast LCIS. The patient was referred by Dr Ninfa Linden. The patient presents to the clinic today accompanied by her husband.  Her mass was found by screening mammogram. She did not feel the mass herself and denies any breast, nipple or weight changes lately. She notes 2 years ago she had an abnormal mammogram which was a benign cyst. She has not had a breast biopsy before this. She does note right breast soreness since her biopsy.  Today she notes since yesterday she had right eye redness and irritation with pain. She has not discharge and her vision is stable.   Socially she is married. She has 1 adult son. She notes she stopped smoking in 1980 after smoking for 10 years 1 pack per 2-3 days . She notes she rarely drinks. She is a retired Tourist information centre manager in [employer] and moved to [place] in 2004.  She has a PMHx of DM diagnosed 1.5 years ago. She is still working on managing this. She has depression/anxiety on Zoloft. She notes she does feel more anxious since her cancer diagnosis which has effected her sleep. She notes she can still function well. She had hysterectomy in 0865 after complications from C-section. She notes she does have hot flashes still and started after her hysterectomy. She was on hormonal replacement for 1 year and stopped using vaginal creams 2 years ago. She notes she has arthritis in her knees, shoulders and manages. She had back and knee surgery.  Her 4 of her maternal cousins had breast cancer and their daughters had  breast cancer. Her paternal aunt had unknown cancer.     GYN HISTORY  Menarchal: 10 LMP: after 74 years old Contraceptive:  HRT: 1 year G2P1: 1 child died at birth. She was 23 with first child    REVIEW OF SYSTEMS:    Constitutional: Denies fevers,  chills or abnormal night sweats (+) Hot flashes (+) Trouble sleeping  Eyes: Denies blurriness of vision, double vision or watery eyes (+) Right eye redness and irritation  Ears, nose, mouth, throat, and face: Denies mucositis or sore throat Respiratory: Denies cough, dyspnea or wheezes Cardiovascular: Denies palpitation, chest discomfort or lower extremity swelling Gastrointestinal:  Denies nausea, heartburn or change in bowel habits Skin: Denies abnormal skin rashes MSK: (+) Arthritis in B/l knee pain  Lymphatics: Denies new lymphadenopathy or easy bruising Neurological:Denies numbness, tingling or new weaknesses Behavioral/Psych: Mood is stable, no new changes (+) Anxious  Breast: (+) right breast soreness at biopsy site  All other systems were reviewed with the patient and are negative.   MEDICAL HISTORY:  Past Medical History:  Diagnosis Date  . Anxiety   . Arthritis    knees  . Depression   . Diabetes mellitus without complication (HCC)    Type 2  . Headache    migraines  . History of pneumonia   . Numbness    "left foot"   . Poor circulation     SURGICAL HISTORY: Past Surgical History:  Procedure Laterality Date  . ABCESS DRAINAGE     "after cesarean- in hospital for 3 months"  . ABDOMINAL HYSTERECTOMY    . CESAREAN SECTION  1970  . CHOLECYSTECTOMY    . COLONOSCOPY    . EYE SURGERY Right   . MENISCUS REPAIR Bilateral   . SPINE SURGERY      SOCIAL HISTORY: Social History   Socioeconomic History  . Marital status: Married    Spouse name: Not on file  . Number of children: 1  . Years of education: Not on file  . Highest education level: Not on file  Occupational History  . Occupation: retired   Tobacco Use  . Smoking status: Former Smoker    Packs/day: 0.50    Years: 10.00    Pack years: 5.00    Quit date: 07/11/1986    Years since quitting: 33.6  . Smokeless tobacco: Never Used  Substance and Sexual Activity  . Alcohol use: Yes    Comment:  occassionally  . Drug use: No  . Sexual activity: Not on file  Other Topics Concern  . Not on file  Social History Narrative  . Not on file   Social Determinants of Health   Financial Resource Strain:   . Difficulty of Paying Living Expenses: Not on file  Food Insecurity:   . Worried About Charity fundraiser in the Last Year: Not on file  . Ran Out of Food in the Last Year: Not on file  Transportation Needs:   . Lack of Transportation (Medical): Not on file  . Lack of Transportation (Non-Medical): Not on file  Physical Activity:   . Days of Exercise per Week: Not on file  . Minutes of Exercise per Session: Not on file  Stress:   . Feeling of Stress : Not on file  Social Connections:   . Frequency of Communication with Friends and Family: Not on file  . Frequency of Social Gatherings with Friends and Family: Not on file  . Attends Religious Services: Not on file  .  Active Member of Clubs or Organizations: Not on file  . Attends Archivist Meetings: Not on file  . Marital Status: Not on file  Intimate Partner Violence:   . Fear of Current or Ex-Partner: Not on file  . Emotionally Abused: Not on file  . Physically Abused: Not on file  . Sexually Abused: Not on file    FAMILY HISTORY: Family History  Problem Relation Age of Onset  . Breast cancer Cousin   . Breast cancer Cousin   . Breast cancer Cousin   . Breast cancer Cousin   . Breast cancer Cousin   . Cancer Paternal Aunt        unknown type cancer    ALLERGIES:  has No Known Allergies.  MEDICATIONS:  Current Outpatient Medications  Medication Sig Dispense Refill  . ALPRAZolam (XANAX) 0.5 MG tablet Take 0.5 mg by mouth 2 (two) times daily as needed.    . baclofen (LIORESAL) 10 MG tablet Take 0.5-1 tablets (5-10 mg total) by mouth at bedtime as needed for muscle spasms. 30 each 3  . calcium-vitamin D 250-100 MG-UNIT tablet Take 1 tablet by mouth daily.    . Cholecalciferol (VITAMIN D-3 PO) Take 1  tablet by mouth daily.    Marland Kitchen gabapentin (NEURONTIN) 100 MG capsule 1 PO q HS, may increase to 3 PO q HS if needed 90 capsule 3  . NOVOFINE 32G X 6 MM MISC AS DIRECTED ONCE A DAY WITH TRESIBA FLEXTOUCH    . ONETOUCH DELICA LANCETS 67T MISC 2 (two) times daily. for testing  2  . ONETOUCH VERIO test strip USE AS DIRECTED TO TEST BLOOD SUGAR TWICE A DAY  3  . sertraline (ZOLOFT) 100 MG tablet Take 100 mg by mouth daily.    . sitaGLIPtin (JANUVIA) 100 MG tablet Take 100 mg by mouth daily.    . TRESIBA FLEXTOUCH 100 UNIT/ML SOPN FlexTouch Pen SMARTSIG:10 Unit(s) SUB-Q Every Morning    . Triamcinolone Acetonide (NASACORT ALLERGY 24HR NA) Place 1 spray into both nostrils daily as needed (for allergies).     No current facility-administered medications for this visit.    PHYSICAL EXAMINATION: ECOG PERFORMANCE STATUS: 0 - Asymptomatic  Vitals:   02/28/20 1108  BP: (!) 145/85  Pulse: 71  Resp: 18  Temp: 97.8 F (36.6 C)  SpO2: 96%   Filed Weights   02/28/20 1108  Weight: 157 lb 12.8 oz (71.6 kg)    GENERAL:alert, no distress and comfortable SKIN: skin color, texture, turgor are normal, no rashes or significant lesions EYES: normal, Conjunctiva are pink and non-injected, sclera clear  NECK: supple, thyroid normal size, non-tender, without nodularity LYMPH:  no palpable lymphadenopathy in the cervical, axillary  LUNGS: clear to auscultation and percussion with normal breathing effort HEART: regular rate & rhythm and no murmurs and no lower extremity edema ABDOMEN:abdomen soft, non-tender and normal bowel sounds (+) Midline abdominal incision healed well.  Musculoskeletal:no cyanosis of digits and no clubbing  NEURO: alert & oriented x 3 with fluent speech, no focal motor/sensory deficits BREAST: (+) Skin ecchymosis with mild hematoma at right breast biopsy site, tender. No palpable mass, nodules or adenopathy bilaterally. Breast exam benign.  LABORATORY DATA:  I have reviewed the data as  listed CBC Latest Ref Rng & Units 01/13/2017 01/13/2017 06/11/2015  WBC 4.0 - 10.5 K/uL - 6.8 10.6(H)  Hemoglobin 12.0 - 15.0 g/dL 14.3 12.8 10.9(L)  Hematocrit 36 - 46 % 42.0 41.8 33.6(L)  Platelets 150 - 400 K/uL -  222 415(H)    CMP Latest Ref Rng & Units 01/13/2017 01/13/2017 06/11/2015  Glucose 65 - 99 mg/dL 114(H) 113(H) 140(H)  BUN 6 - 20 mg/dL _0 Creatinine 0.44 - 1.00 mg/dL 0.40(L) 0.60 0.59  Sodium 135 - 145 mmol/L 141 138 136  Potassium 3.5 - 5.1 mmol/L 4.4 4.0 4.4  Chloride 101 - 111 mmol/L 106 106 99(L)  CO2 22 - 32 mmol/L - 24 26  Calcium 8.9 - 10.3 mg/dL - 9.1 9.4  Total Protein 6.5 - 8.1 g/dL - 7.2 7.0  Total Bilirubin 0.3 - 1.2 mg/dL - 0.6 0.8  Alkaline Phos 38 - 126 U/L - 70 95  AST 15 - 41 U/L - 23 30  ALT 14 - 54 U/L - 19 43     RADIOGRAPHIC STUDIES: I have personally reviewed the radiological images as listed and agreed with the findings in the report. MM Digital Diagnostic Unilat R  Result Date: 02/13/2020 CLINICAL DATA:  Patient returns today for evaluation of RIGHT breast calcifications identified on recent screening mammogram. EXAM: DIGITAL DIAGNOSTIC RIGHT MAMMOGRAM WITH CAD COMPARISON:  Previous exams including recent screening mammogram dated 01/24/2020. ACR Breast Density Category b: There are scattered areas of fibroglandular density. FINDINGS: Linear branching casting-type calcifications are confirmed within the upper outer quadrant of the RIGHT breast, measuring 2.8 cm extent. Mammographic images were processed with CAD. IMPRESSION: Highly suspicious calcifications within the upper-outer quadrant of the RIGHT breast, measuring 2.8 cm extent, for which stereotactic biopsy is recommended. RECOMMENDATION: Stereotactic biopsy for the RIGHT breast calcifications. Stereotactic biopsy is scheduled for 02/21/2020. I have discussed the findings and recommendations with the patient. If applicable, a reminder letter will be sent to the patient regarding the next  appointment. BI-RADS CATEGORY  5: Highly suggestive of malignancy. Electronically Signed   By: Franki Cabot M.D.   On: 02/13/2020 15:11   MM CLIP PLACEMENT RIGHT  Result Date: 02/17/2020 CLINICAL DATA:  Status post stereotactic guided core needle biopsy right breast calcifications. EXAM: DIAGNOSTIC RIGHT MAMMOGRAM POST STEREOTACTIC BIOPSY COMPARISON:  Previous exam(s). FINDINGS: Coil shaped marking clip was deployed at the site of biopsy. Note the coil shaped marking clip is located approximately 7 mm inferior to the site of calcifications within the outer right breast. IMPRESSION: Approximate 7 mm inferior migration of the coil shaped marking clip relative to the residual calcifications within the outer right breast. Final Assessment: Post Procedure Mammograms for Marker Placement Electronically Signed   By: Lovey Newcomer M.D.   On: 02/17/2020 14:43   MM RT BREAST BX W LOC DEV 1ST LESION IMAGE BX SPEC STEREO GUIDE  Addendum Date: 02/18/2020   ADDENDUM REPORT: 02/18/2020 14:53 ADDENDUM: Pathology revealed DUCTAL CARCINOMA IN SITU, HIGH-GRADE WITH NECROSIS AND CALCIFICATIONS, LOBULAR CARCINOMA IN SITU of the RIGHT breast, lateral. This was found to be concordant by Dr. Lovey Newcomer. Pathology results were discussed with the patient by telephone. The patient reported doing well after the biopsy with tenderness at the site. Post biopsy instructions and care were reviewed and questions were answered. The patient was encouraged to call [facility] for any additional concerns. Surgical consultation has been arranged with Dr. Nedra Hai at [facility] on February 25, 2020. Breast MRI suggested for extent of disease. Pathology results reported by Stacie Acres RN on 02/18/2020. Electronically Signed   By: Lovey Newcomer M.D.   On: 02/18/2020 14:53   Result Date: 02/18/2020 CLINICAL DATA:  Patient with indeterminate right breast  calcifications. EXAM: RIGHT BREAST STEREOTACTIC CORE  NEEDLE BIOPSY COMPARISON:  Previous exams. FINDINGS: The patient and I discussed the procedure of stereotactic-guided biopsy including benefits and alternatives. We discussed the high likelihood of a successful procedure. We discussed the risks of the procedure including infection, bleeding, tissue injury, clip migration, and inadequate sampling. Informed written consent was given. The usual time out protocol was performed immediately prior to the procedure. Using sterile technique and 1% Lidocaine as local anesthetic, under stereotactic guidance, a 9 gauge vacuum assisted device was used to perform core needle biopsy of calcifications within the outer right breast using a cranial approach. Note in the original few specimen samples, no calcifications were identified. Repeat imaging of the right breast was obtained and it was noted that the calcifications were slightly posterior to the needle placement. The breast was repositioned and the calcifications were centered. Specimen radiograph was subsequently performed showing calcifications in the additional samples. Specimens with calcifications are identified for pathology. Lesion quadrant: Upper outer quadrant At the conclusion of the procedure, coil shaped tissue marker clip was deployed into the biopsy cavity. Follow-up 2-view mammogram was performed and dictated separately. IMPRESSION: Stereotactic-guided biopsy of right breast calcifications. No apparent complications. Electronically Signed: By: Lovey Newcomer M.D. On: 02/17/2020 14:42    ASSESSMENT & PLAN:  Judith Chavez is a 74 y.o. African American female with a history of Anxiety/depression, arthritis, DM   1. Right breast DCIS, High Grade, ER /PR+ -I discussed her breast imaging and needle biopsy results with patient and her husband in great detail. She has a 2.8cm mass in the right breast with DCIS and components of necrosis and LCIS. There was no invasive cancer on biopsy.  -She will proceed with B/l  MRI breast on 03/04/20 for further evaluation.  -She is a candidate for breast conservation surgery. She has been seen by breast surgeon Dr. Ninfa Linden, who recommends lumpectomy. Plan to proceed soon.  -Her DCIS will be cured by complete surgical resection. Any form of adjuvant therapy is preventive given her high risk of future breast cancer.  -I reviewed her risk and treatment benefits using the Breast Cancer Nomogram from [facility]). She has 10-15% risk of developing future breast cancer in the next 5-10 years. Her risk would drop to 4-6% with RT or 5-8% with Antiestrogen therapy alone. With both adjuvant treatments her risk would decrease to 2-3%.  -Given her good health overall, I anticipate she could tolerate both Radiation and Antiestrogen therapy at her age. She is interested in both treatments.  -Given her strongly positive ER and PR and arthritis, history of hysterectomy, I recommend antiestrogen therapy with Tamoxifen for 5 years, which decrease her risk of future breast cancer by ~40-50%.   --The potential side effects, which includes but not limited to, hot flash, skin and vaginal dryness, slightly increased risk of cardiovascular disease and cataract, small risk of thrombosis, were discussed with her in great details. She will need to watch her weight, blood sugar and cholesterol on Antiestrogen therapy. I encouraged her to remain active.  -She will likely benefit from breast radiation if she undergo lumpectomy to decrease the risk of breast cancer. She will discuss this further with Dr Earney Hamburg on 03/11/20.  -We also discussed that biopsy may have sampling limitation, we will review her surgical path, to see if she has any invasive carcinoma components. -We discussed breast cancer surveillance after she completes treatment, Including annual mammogram, breast exam every 6-12 months. -No mass was  palpable on her breast exam today (02/28/20) -I will f/u with her after  Surgery and Radiation    2. Genetic Testing  -She has extensive family history of breast cancer in 4 of her maternal cousin and their daughters. I discussed she is eligible for genetic testing, she is interested. I will refer her.    3. Anxiety/Depression  -She has experienced depression and anxiety. She is on Zoloft  -She notes she is more anxious since her cancer diagnosis.  -I offered her chance to talk with out SW for counseling, she is agreeable.  -Her anxiety has impacted her sleep. I recommend OTC melatonin or Benadryl to help  4. Comorbidities: DM, Arthritis  -She is on [place]. -She has arthritis in her knees, shoulders and back. She has had knee and back surgery. She is able to remain active with routine activities but will have some pain.   -Due to abscess complication after C-section she required total hysterectomy, BSO with 20% of liver removed in 1970.   5. Right eye Redness  -Yesterday she woke up with right eye redness, irritation and pain.  -I recommend she use OTC eye drops and if worsens or does not go away she should see her Ophthalmologist.    PLAN:  -Baseline Labs today  -Send Genetic referral  -Refer to SW for anxiety counseling -Mri breast on 8/25 scheduled  -Proceed with breast surgery  -f/u after Radiation   Orders Placed This Encounter  Procedures  . CBC with Differential (Cancer Center Only)    Standing Status:   Future    Number of Occurrences:   1    Standing Expiration Date:   02/27/2021  . CMP ([facility] only)    Standing Status:   Future    Number of Occurrences:   1    Standing Expiration Date:   02/27/2021  . Ambulatory referral to Genetics    Referral Priority:   Routine    Referral Type:   Consultation    Referral Reason:   Specialty Services Required    Number of Visits Requested:   1  . Ambulatory referral to Social Work    Referral Priority:   Routine    Referral Type:   Consultation    Referral Reason:    Specialty Services Required    Number of Visits Requested:   1    All questions were answered. The patient knows to call the clinic with any problems, questions or concerns. The total time spent in the appointment was 50 minutes.     Truitt Merle, MD 02/28/2020 11:47 AM  I, Joslyn Devon, am acting as scribe for Truitt Merle, MD.   I have reviewed the above documentation for accuracy and completeness, and I agree with the above.

## 2020-02-28 NOTE — Progress Notes (Signed)
REFERRING PROVIDER: Truitt Merle, MD [address] [place],  [place] [address]  PRIMARY PROVIDER:  Mayra Neer, MD  PRIMARY REASON FOR VISIT:  1. Malignant neoplasm of upper-outer quadrant of right breast in female, estrogen receptor positive ([place])   2. Family history of breast cancer     HISTORY OF PRESENT ILLNESS:   Judith Chavez, a 74 y.o. female, was seen for a [place] cancer genetics consultation at the request of Dr. Burr Medico due to a personal history of DCIS and family history of breast cancer.  Judith Chavez presents to clinic today with her husband, [name], to discuss the possibility of a hereditary predisposition to cancer, genetic testing, and to further clarify her future cancer risks, as well as potential cancer risks for family members.   In 2021, at the age of 60, Judith Chavez was diagnosed with right ductal carcinoma in situ and lobular carcinoma in situ of the right breast.  Hormone receptor status is ER+ and PR+. The preliminary treatment plan includes surgery, radiation, and anti-estrogens.   CANCER HISTORY:  Oncology History Overview Note  Cancer Staging Malignant neoplasm of upper-outer quadrant of right breast in female, estrogen receptor positive ([place]) Staging form: Breast, AJCC 8th Edition - Clinical stage from 02/17/2020: Stage 0 (cTis (DCIS), cN0, cM0, ER+, PR+) - Signed by Gardenia Phlegm, NP on 02/26/2020    Malignant neoplasm of upper-outer quadrant of right breast in female, estrogen receptor positive ([place])  02/13/2020 Mammogram   IMPRESSION: Highly suspicious calcifications within the upper-outer quadrant of the RIGHT breast, measuring 2.8 cm extent, for which stereotactic biopsy is recommended.   02/17/2020 Cancer Staging   Staging form: Breast, AJCC 8th Edition - Clinical stage from 02/17/2020: Stage 0 (cTis (DCIS), cN0, cM0, ER+, PR+) - Signed by Gardenia Phlegm, NP on 02/26/2020   02/17/2020 Initial Biopsy   Diagnosis Breast, right,  needle core biopsy, lateral - DUCTAL CARCINOMA IN SITU, HIGH-GRADE WITH NECROSIS AND CALCIFICATIONS. SEE NOTE - LOBULAR CARCINOMA IN SITU Diagnosis Note DCIS measures 0.5 cm in greatest linear dimension. Dr. Saralyn Pilar reviewed the case and concurs with the diagnosis. A breast prognostic profile (ER, PR) is pending and will be reported in an addendum. The [facility] was notified on 02/18/2020.   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  First live birth at age 89.  OCP use for approximately 2-3 years.  Ovaries intact: no.  Hysterectomy: yes; 7493 due to complication for C-section Menopausal status: postmenopausal HRT use: 1 years. Colonoscopy: yes; patient reports most recent was four years ago and no polyps were detected. Mammogram within the last year: yes. Number of breast biopsies: 1. Up to date with pelvic exams: yes. Any excessive radiation exposure in the past: no  Past Medical History:  Diagnosis Date   Anxiety    Arthritis    knees   Depression    Diabetes mellitus without complication ([place])    Type 2   Family history of breast cancer 02/28/2020   Headache    migraines   History of pneumonia    Numbness    "left foot"    Poor circulation     Past Surgical  History:  Procedure Laterality Date   ABCESS DRAINAGE     "after cesarean- in hospital for 3 months"   ABDOMINAL HYSTERECTOMY     CESAREAN SECTION  1970   CHOLECYSTECTOMY     COLONOSCOPY     EYE SURGERY Right    MENISCUS REPAIR Bilateral    SPINE SURGERY      Social History    Socioeconomic History   Marital status: Married    Spouse name: Not on file   Number of children: 1   Years of education: Not on file   Highest education level: Not on file  Occupational History   Occupation: retired   Tobacco Use   Smoking status: Former Smoker    Packs/day: 0.50    Years: 10.00    Pack years: 5.00    Quit date: 07/11/1986    Years since quitting: 33.6   Smokeless tobacco: Never Used  Substance and Sexual Activity   Alcohol use: Yes    Comment: occassionally   Drug use: No   Sexual activity: Not on file  Other Topics Concern   Not on file  Social History Narrative   Not on file   Social Determinants of Health   Financial Resource Strain:    Difficulty of Paying Living Expenses: Not on file  Food Insecurity:    Worried About Charity fundraiser in the Last Year: Not on file   [employer] of Food in the Last Year: Not on file  Transportation Needs:    Lack of Transportation (Medical): Not on file   Lack of Transportation (Non-Medical): Not on file  Physical Activity:    Days of Exercise per Week: Not on file   Minutes of Exercise per Session: Not on file  Stress:    Feeling of Stress : Not on file  Social Connections:    Frequency of Communication with Friends and Family: Not on file   Frequency of Social Gatherings with Friends and Family: Not on file   Attends Religious Services: Not on file   Active Member of Clubs or Organizations: Not on file   Attends Archivist Meetings: Not on file   Marital Status: Not on file     FAMILY HISTORY:  We obtained a detailed, 4-generation family history.  Significant diagnoses are listed below: Family History  Problem Relation Age of Onset   Breast cancer Cousin        maternal; dx and d. <50   Breast cancer Cousin        maternal; dx and d. <50   Breast cancer Cousin        maternal; dx and d. <50   Breast cancer Cousin        maternal; dx and d. <50   Cancer  Paternal Aunt        unknown type cancer dx 49s         Judith Chavez has one living son who is 29 years old.  Judith Chavez reports that he mentioned that he may need a prostate evaluation in the near future due to concern for cancer brought up by his physician.  Judith Chavez mother passed away at age 51 from a cause unrelated to cancer.  Judith Chavez maternal aunt was diagnosed with an unknown cancer in her 55s and passed away in her 57s.  Judith Chavez has had four maternal cousins with breast cancer, diagnosed before the age of 76, who all passed away before the age of  61.  Two of these maternal cousins with breast cancer have had daughters who have had breast cancer as well.  Judith Chavez father passed away at age 35 from a cause unrelated to cancer.  Judith Chavez has one paternal aunt with lung cancer diagnosed and deceased in her 54s-70s.  Judith Chavez also has one paternal cousin with throat cancer in his 79s.  No other family history of cancer was reported.   Judith Chavez is unaware of previous family history of genetic testing for hereditary cancer risks. Patient's maternal ancestors are of African American descent, and paternal ancestors are of African American descent. There is no reported Ashkenazi Jewish ancestry. There is no known consanguinity.  GENETIC COUNSELING ASSESSMENT: Judith Chavez is a 74 y.o. female with a family history of breast cancer which is somewhat suggestive of a hereditary breast cancer syndrome and predisposition to cancer given related cancer diagnoses in multiple generations of the family. We, therefore, discussed and recommended the following at today's visit.   DISCUSSION: We discussed that 5 - 10% of cancer is hereditary, with most cases of hereditary breast cancer associated with mutations in BRCA1 and BRCA2.  There are other genes that can be associated with hereditary breast cancer syndromes.  Type of cancer risk and level of risk are gene-specific.  We discussed that testing  is beneficial for several reasons including knowing how to follow individuals after completing their treatment, identifying whether potential treatment options would be beneficial, and understand if other family members could be at risk for cancer and allow them to undergo genetic testing.   We reviewed the characteristics, features and inheritance patterns of hereditary cancer syndromes. We also discussed genetic testing, including the appropriate family members to test, the process of testing, insurance coverage and turn-around-time for results. We discussed the implications of a negative, positive, carrier and/or variant of uncertain significant result. We recommended Judith Chavez pursue genetic testing for a panel that includes genes associated with breast cancer.  In order to get genetic test results in a timely manner so that Ms. Sou can use these genetic test results for surgical decisions, we recommended Judith Chavez pursue genetic testing for the Breast Cancer STAT Panel. The STAT Breast cancer panel offered by [facility] includes sequencing and rearrangement analysis for the following 9 genes:  ATM, BRCA1, BRCA2, CDH1, CHEK2, PALB2, PTEN, STK11 and TP53.   Once complete, we recommend Judith Chavez pursue reflex genetic testing to a more comprehensive gene panel.   Judith Chavez  was offered a common hereditary cancer panel (48 genes) and an expanded pan-cancer panel (85 genes). Judith Chavez was informed of the benefits and limitations of each panel, including that expanded pan-cancer panels contain several preliminary evidence genes that do not have clear management guidelines at this point in time.  We also discussed that as the number of genes included on a panel increases, the chances of variants of uncertain significance increases.  After considering the benefits and limitations of each gene panel, Judith Chavez  elected to have common hereditary cancer panel through [facility].  The Common Hereditary Cancers  Panel offered by [facility] includes sequencing and/or deletion duplication testing of the following 48 genes: APC, ATM, AXIN2, BARD1, BMPR1A, BRCA1, BRCA2, BRIP1, CDH1, CDK4, CDKN2A (p14ARF), CDKN2A (p16INK4a), CHEK2, CTNNA1, DICER1, EPCAM (Deletion/duplication testing only), GREM1 (promoter region deletion/duplication testing only), KIT, MEN1, MLH1, MSH2, MSH3, MSH6, MUTYH, NBN, NF1, NHTL1, PALB2, PDGFRA, PMS2, POLD1, POLE, PTEN, RAD50, RAD51C, RAD51D, RNF43, SDHB, SDHC, SDHD, SMAD4, SMARCA4. STK11, TP53,  TSC1, TSC2, and VHL.  The following genes were evaluated for sequence changes only: SDHA and HOXB13 c.251G>A variant only.  Based on Ms. Minks's personal and family history of breast cancer, she meets medical criteria for genetic testing. Despite that she meets criteria, she may still have an out of pocket cost. We discussed that if her out of pocket cost for testing is over $100, the laboratory will call and confirm whether she wants to proceed with testing.  If the out of pocket cost of testing is less than $100 she will be billed by the genetic testing laboratory.    PLAN: After considering the risks, benefits, and limitations, Ms. Scoggin provided informed consent to pursue genetic testing and the blood sample was sent to [facility] for analysis of the Breast Cancer STAT Panel and the Common Hereditary Cancers Panel. Results for the STAT panel should be available within approximately 10 days. Results for the Common Hereditary Cancers Panel should be available within approximately 3 weeks.  Both sets of results will be disclosed by telephone to Ms. Bolinger, as will any additional recommendations warranted by these results. Ms. Soulliere will receive a summary of her genetic counseling visit and a copy of her results once available. This information will also be available in Epic.   Based on Ms. Lantry's family history, we recommended the children of those who passed away from breast cancer on her  maternal side have genetic counseling and testing. Ms. Beale will let us know if we can be of any assistance in coordinating genetic counseling and/or testing for this family member.   Lastly, we encouraged Ms. Cayer to remain in contact with cancer genetics annually so that we can continuously update the family history and inform her of any changes in cancer genetics and testing that may be of benefit for this family.   Ms. Sohail questions were answered to her satisfaction today. Our contact information was provided should additional questions or concerns arise. Thank you for the referral and allowing [place] to share in the care of your patient.   Shenea Giacobbe M. Joette Catching, [email or web address] (P) [phone]  The patient was seen for a total of 50 minutes in face-to-face genetic counseling.  This patient was discussed with Drs. Magrinat, Lindi Adie and/or Burr Medico who agrees with the above.    _______________________________________________________________________ For Office Staff:  Number of people involved in session: 1 Was an Intern/ student involved with case: no

## 2020-02-29 ENCOUNTER — Encounter: Payer: Self-pay | Admitting: Hematology

## 2020-03-02 ENCOUNTER — Encounter: Payer: Self-pay | Admitting: *Deleted

## 2020-03-02 NOTE — Progress Notes (Signed)
[name]  Clinical Social Work received referral from [employer] for emotional support, counseling and support programs.  CSW contacted patient at home to offer support and assess for needs.  Patient was unable to talk with CSW and requested CSW call back tomorrow 8/24.  Judith Chavez, MSW, LCSW, OSW-C Clinical Social Worker [facility] [phone]

## 2020-03-03 ENCOUNTER — Encounter: Payer: Self-pay | Admitting: *Deleted

## 2020-03-03 NOTE — Progress Notes (Signed)
[place] Work  Clinical Social Work received referral from [employer] for counseling resources and emotional support.  CSW contacted patient at home to offer support and assess for needs.  Patient described feeling overwhelmed and anxious to get the treatment process started and completed.  CSW and patient discussed common feelings and emotions when being diagnosed with cancer and beginning the treatment planning process.  CSW provided education on CSW roll and support services and resources at [facility].  CSW and patient discussed the importance of support and patient identified her family and multiple community groups as positive support.  CSW and patient discussed counseling options at [facility] and in the community.  Patient expressed interest in breast cancer support group and [facility].  CSW completed [facility] referral and added patient to the support programs monthly mailing list and breast cancer support group e-mail list.  CSW/support team member will plan to meet with patient after her appointment at [facility] on 03/11/20 to provide a journey book and additional support/resource information.  Patient was appreciative of CSW contact.  CSW provided contact information and encouraged patient to call with questions or concerns.    Johnnye Lana, MSW, LCSW, OSW-C Clinical Social Worker [facility] [phone]

## 2020-03-04 ENCOUNTER — Other Ambulatory Visit: Payer: Self-pay

## 2020-03-04 ENCOUNTER — Ambulatory Visit
Admission: RE | Admit: 2020-03-04 | Discharge: 2020-03-04 | Disposition: A | Discharge status: Home / Self Care | Payer: Medicare Other | Source: Ambulatory Visit | Attending: Surgery | Admitting: Surgery

## 2020-03-04 DIAGNOSIS — D0501 Lobular carcinoma in situ of right breast: Secondary | ICD-10-CM

## 2020-03-04 MED ORDER — GADOBUTROL 1 MMOL/ML IV SOLN
8.0000 mL | Freq: Once | INTRAVENOUS | Status: AC | PRN
  Administered 2020-03-04: 8 mL via INTRAVENOUS

## 2020-03-05 ENCOUNTER — Other Ambulatory Visit: Payer: Self-pay | Admitting: Surgery

## 2020-03-05 DIAGNOSIS — D0501 Lobular carcinoma in situ of right breast: Secondary | ICD-10-CM

## 2020-03-08 ENCOUNTER — Ambulatory Visit
Admission: RE | Admit: 2020-03-08 | Discharge: 2020-03-08 | Disposition: A | Discharge status: Home / Self Care | Payer: Medicare Other | Source: Ambulatory Visit | Attending: Surgery | Admitting: Surgery

## 2020-03-08 DIAGNOSIS — D0501 Lobular carcinoma in situ of right breast: Secondary | ICD-10-CM

## 2020-03-08 DIAGNOSIS — D0511 Intraductal carcinoma in situ of right breast: Secondary | ICD-10-CM | POA: Diagnosis not present

## 2020-03-08 MED ORDER — GADOBUTROL 1 MMOL/ML IV SOLN
7.0000 mL | Freq: Once | INTRAVENOUS | Status: AC | PRN
  Administered 2020-03-08: 7 mL via INTRAVENOUS

## 2020-03-09 ENCOUNTER — Encounter: Payer: Self-pay | Admitting: Rehabilitation

## 2020-03-09 ENCOUNTER — Ambulatory Visit: Payer: Medicare Other | Attending: Surgery | Admitting: Rehabilitation

## 2020-03-09 ENCOUNTER — Telehealth: Payer: Self-pay | Admitting: Genetic Counselor

## 2020-03-09 ENCOUNTER — Other Ambulatory Visit: Payer: Self-pay

## 2020-03-09 DIAGNOSIS — R293 Abnormal posture: Secondary | ICD-10-CM | POA: Insufficient documentation

## 2020-03-09 NOTE — Progress Notes (Addendum)
Patient here for a consult with Dr. Sondra Come.  Name: Judith Chavez MRN: [id number]  Date: 03/11/2020  DOB: [birth year]  ZY:[id number], Nathen May, MD  Coralie Keens, MD   REFERRING PHYSICIAN: Coralie Keens, MD  DIAGNOSIS: There were no encounter diagnoses.  Stage 0 (cTis, cN0, cM0), Right Breast UOQ, DCIS and LCIS, ER+ / PR+, Grade 3  HISTORY OF PRESENT ILLNESS::Judith Chavez is a 74 y.o. female who is seen as a courtesy of Dr. Ninfa Linden for an opinion concerning radiation therapy as part of management for her recently diagnosed right breast cancer.  Patient had routine screening mammography on 01/24/2020 that showed calcifications in the right breast. She then underwent unilateral diagnostic mammography on 02/13/2020 that showed highly suspicious calcifications within the upper-outer quadrant of the right breast that measured 2.8 cm.  Biopsy on 02/17/2020 revealed high-grade ductal carcinoma in-situ with necrosis and calcifications in addition to lobular carcinoma in-situ. Prognostic indicators significant were for estrogen receptor, 95% positive and progesterone receptor, 70% positive, both with moderate staining intensities.  The patient was seen in consultation with Dr. Burr Medico on 02/28/2020. At that time, she had already been seen by Dr. Ninfa Linden and it was recommended that she proceed genetic testing, bilateral breast MRI, right breast lumpectomy, adjuvant radiation therapy, and anti-estrogen therapy.  Genetic testing was performed on 02/28/2020 and was negative. No pathogenic variants were identified.  MRI of bilateral breasts was performed on 03/09/2020 and showed .  PREVIOUS RADIATION THERAPY: No  PAST MEDICAL HISTORY:  Past Medical History:  Diagnosis Date  . Anxiety   . Arthritis    knees  . Depression   . Diabetes mellitus without complication ([facility])    Type 2  . Family history of breast cancer 02/28/2020  . Headache    migraines  . History of pneumonia   . Numbness    "left  foot"   . Poor circulation     PAST SURGICAL HISTORY: Past Surgical History:  Procedure Laterality Date  . ABCESS DRAINAGE     "after cesarean- in hospital for 3 months"  . ABDOMINAL HYSTERECTOMY    . CESAREAN SECTION  1970  . CHOLECYSTECTOMY    . COLONOSCOPY    . EYE SURGERY Right   . MENISCUS REPAIR Bilateral   . SPINE SURGERY         Past/Anticipated interventions by medical oncology, if any: Antiestrogen therapy  Lymphedema issues, if any: no  Pain issues, if any:   Right leg "7"  SAFETY ISSUES:  Prior radiation no  Pacemaker/ICD? no  Possible current pregnancy? hysterectomy  Is the patient on methotrexate?no  Current Complaints / other details:  Accompanied by her husband. States will have to have another Mri and bx as the mass has grown.  BP (!) 142/98 (BP Location: Left Arm, Patient Position: Sitting)   Pulse 60   Temp 98.2 F (36.8 C) (Oral)   Resp 20   Ht 5\' 2"  (1.575 m)   Wt 158 lb 2 oz (71.7 kg)   SpO2 97%   BMI 28.92 kg/m   Wt Readings from Last 3 Encounters:  03/11/20 158 lb 2 oz (71.7 kg)  02/28/20 157 lb 12.8 oz (71.6 kg)  01/03/20 163 lb (73.9 kg)      De Burrs, RN 03/09/2020,2:11 PM

## 2020-03-09 NOTE — Telephone Encounter (Signed)
Revealed negative genetic testing.  Discussed that we do not know why she has breast cancer or why there is cancer in the family. It could be due to a different gene that we are not testing, or maybe our current technology may not be able to pick something up.  It will be important for her to keep in contact with genetics to keep up with whether additional testing may be needed. 

## 2020-03-09 NOTE — Therapy (Signed)
[place], [place], [address] Phone: [phone]   Fax:  [phone]  Physical Therapy Evaluation  Patient Details  Name: Judith Chavez MRN: [id number] Date of Birth: [birth year] Referring Provider (PT): Dr. Ninfa Linden   Encounter Date: 03/09/2020   PT End of Session - 03/09/20 0944    Visit Number 1    Number of Visits 2    Date for PT Re-Evaluation 05/04/20    PT Start Time 0915    PT Stop Time 0944    PT Time Calculation (min) 29 min    Activity Tolerance Patient tolerated treatment well    Behavior During Therapy [facility] for tasks assessed/performed           Past Medical History:  Diagnosis Date  . Anxiety   . Arthritis    knees  . Depression   . Diabetes mellitus without complication (HCC)    Type 2  . Family history of breast cancer 02/28/2020  . Headache    migraines  . History of pneumonia   . Numbness    "left foot"   . Poor circulation     Past Surgical History:  Procedure Laterality Date  . ABCESS DRAINAGE     "after cesarean- in hospital for 3 months"  . ABDOMINAL HYSTERECTOMY    . CESAREAN SECTION  1970  . CHOLECYSTECTOMY    . COLONOSCOPY    . EYE SURGERY Right   . MENISCUS REPAIR Bilateral   . SPINE SURGERY      There were no vitals filed for this visit.    Subjective Assessment - 03/09/20 0917    Subjective I sometimes have muscle tightness in the neck.  I have done therapy for dry needling for the neck    Patient is accompained by: Family member   husband   Pertinent History Rt breast cancer DCIS, LCIS, ER/PR positive awaiting lumpectomy with possible SLNB by Dr. Ninfa Linden and most likely radiation and antiestrogen therapy. Other history includes anxiety, OA, hysterectomy, DM    Patient Stated Goals get information from providers    Currently in Pain? Yes    Pain Score 5     Pain Location Leg    Pain Orientation Right    Pain Descriptors / Indicators  Aching;Throbbing    Pain Type Chronic pain    Pain Onset More than a month ago    Pain Frequency Intermittent    Aggravating Factors  use and tension              [facility] PT Assessment - 03/09/20 0001      Assessment   Medical Diagnosis Rt breast cancer    Referring Provider (PT) Dr. Ninfa Linden    Hand Dominance Right      Precautions   Precaution Comments cancer      Balance Screen   Has the patient fallen in the past 6 months No    Has the patient had a decrease in activity level because of a fear of falling?  No    Is the patient reluctant to leave their home because of a fear of falling?  No      Home Environment   Living Environment Private residence    Living Arrangements Spouse/significant other    Available Help at Discharge Family      Prior Function   Level of [place] Retired    Leisure tennis, walking every day at least 2 miles  Cognition   Overall Cognitive Status Within Functional Limits for tasks assessed      Posture/Postural Control   Posture/Postural Control Postural limitations    Postural Limitations Rounded Shoulders;Forward head      ROM / Strength   AROM / PROM / Strength AROM      AROM   AROM Assessment Site Shoulder    Right/Left Shoulder Right;Left    Right Shoulder Extension 66 Degrees    Right Shoulder Flexion 165 Degrees    Right Shoulder ABduction 177 Degrees    Right Shoulder Internal Rotation 75 Degrees    Right Shoulder External Rotation 110 Degrees    Right Shoulder Horizontal ABduction 45 Degrees             LYMPHEDEMA/ONCOLOGY QUESTIONNAIRE - 03/09/20 0001      Lymphedema Assessments   Lymphedema Assessments Upper extremities      Right Upper Extremity Lymphedema   10 cm Proximal to Olecranon Process 31.8 cm    Olecranon Process 26.8 cm    10 cm Proximal to Ulnar Styloid Process 22.3 cm    Just Proximal to Ulnar Styloid Process 16.5 cm    Across Hand at [employer] 21 cm    At  [place] of 2nd Digit 6.6 cm      Left Upper Extremity Lymphedema   10 cm Proximal to Olecranon Process 32.5 cm    Olecranon Process 26.9 cm    10 cm Proximal to Ulnar Styloid Process 22.1 cm    Just Proximal to Ulnar Styloid Process 16.5 cm    Across Hand at [employer] 21 cm    At [place] of 2nd Digit 5.7 cm           L-DEX FLOWSHEETS - 03/09/20 0900      L-DEX LYMPHEDEMA SCREENING   Measurement Type Unilateral    L-DEX MEASUREMENT EXTREMITY Upper Extremity    POSITION  Standing    DOMINANT SIDE Right    At Risk Side Right    BASELINE SCORE (UNILATERAL) -4.4    L-DEX SCORE (UNILATERAL) -4.4    VALUE CHANGE (UNILAT) 0              .bmd    Objective measurements completed on examination: See above findings.               PT Education - 03/09/20 [phone]    Education Details lymphedema surveillance and brief physiology, SOZO, post op stretches and walking    Person(s) Educated Patient;Spouse    Methods Explanation;Demonstration;Handout    Comprehension Verbalized understanding;Returned demonstration                Breast Clinic Goals - 03/09/20 0946      Patient will be able to verbalize understanding of pertinent lymphedema risk reduction practices relevant to her diagnosis specifically related to skin care.   Status Achieved      Patient will be able to return demonstrate and/or verbalize understanding of the post-op home exercise program related to regaining shoulder range of motion.   Status Achieved      Patient will be able to verbalize understanding of the importance of attending the postoperative After Breast Cancer Class for further lymphedema risk reduction education and therapeutic exercise.   Status Achieved                 Plan - 03/09/20 0944    Clinical Impression Statement Pt presents pre-operatively for baseline assessment prior to lumpectomy and SLNB  with radiation and antiestrogen planned.  Pt was educated on post op  stretches and care, lymphedema surveillance and SOZO, and reasons for PT.  Pt is very active with tennis and walking and ready for surgery to be completed.    Personal Factors and Comorbidities Age    Stability/Clinical Decision Making Stable/Uncomplicated    Clinical Decision Making Low    Rehab Potential Excellent    PT Frequency Other (comment)   post op visit   PT Duration 8 weeks    PT Treatment/Interventions Therapeutic exercise;Patient/family education;Manual techniques    PT Next Visit Plan post op    PT Home Exercise Plan post op    Recommended Other Services ABC class    Consulted and Agree with Plan of Care Patient           Patient will benefit from skilled therapeutic intervention in order to improve the following deficits and impairments:  Postural dysfunction  Visit Diagnosis: Abnormal posture     Problem List Patient Active Problem List   Diagnosis Date Noted  . Family history of breast cancer 02/28/2020  . Malignant neoplasm of upper-outer quadrant of right breast in female, estrogen receptor positive ([place]) 02/26/2020  . Primary osteoarthritis of right knee 03/20/2019  . Primary osteoarthritis of left knee 03/20/2019  . Primary osteoarthritis of both knees 04/24/2017  . Spondylolisthesis at L3-L4 level 06/01/2015    Donovan Kail, PT 03/09/2020, 9:47 AM  [place], [place], [address] Phone: [phone]   Fax:  [phone]  Name: Judith Chavez MRN: [id number] Date of Birth: 1946/02/14

## 2020-03-09 NOTE — Patient Instructions (Signed)

## 2020-03-09 NOTE — Progress Notes (Signed)
Radiation Oncology         [phone] ________________________________  Initial Outpatient Consultation  Name: Judith Chavez MRN: [id number]  Date: 03/11/2020  DOB: [birth year]  GU:RKYH, Nathen May, MD  Coralie Keens, MD   REFERRING PHYSICIAN: Coralie Keens, MD  DIAGNOSIS: The encounter diagnosis was Malignant neoplasm of upper-outer quadrant of right breast in female, estrogen receptor positive ([place]).  Stage 0 (cTis, cN0, cM0), Right Breast UOQ, DCIS and LCIS, ER+ / PR+, Grade 3  HISTORY OF PRESENT ILLNESS::Judith Chavez is a 74 y.o. female who is seen as a courtesy of Dr. Ninfa Linden for an opinion concerning radiation therapy as part of management for her recently diagnosed right breast cancer. Today, she is accompanied by her husband. She had routine screening mammography on 01/24/2020 that showed calcifications in the right breast. She then underwent unilateral diagnostic mammography on 02/13/2020 that showed highly suspicious calcifications within the upper-outer quadrant of the right breast that measured 2.8 cm.  Biopsy on 02/17/2020 revealed high-grade ductal carcinoma in-situ with necrosis and calcifications in addition to lobular carcinoma in-situ. Prognostic indicators significant were for estrogen receptor, 95% positive and progesterone receptor, 70% positive, both with moderate staining intensities.  The patient was seen in consultation with Dr. Burr Medico on 02/28/2020. At that time, she had already been seen by Dr. Ninfa Linden and it was recommended that she proceed genetic testing, bilateral breast MRI, right breast lumpectomy, adjuvant radiation therapy, and anti-estrogen therapy.  Genetic testing was performed on 02/28/2020 and was negative. No pathogenic variants were identified.  MRI of bilateral breasts was performed on 03/09/2020 and showed a suspicious linear enhancement involving the upper-outer quadrant of the right breast from mid to posterior depth. Susceptibility  artifact from biopsy-clip was seen along the posterior, inferior margin. The overall suspicious finding spanned approximately 4.2 x 2.8 cm in the AP by transverse dimensions. There were no suspicious MRI findings in the remainder of the right breast, nor was there any evidence of malignancy on the left. There was no suspicious lymphadenopathy.  There was a separate small area adjacent to the improvement DCIS which was recommended additional biopsy.  PREVIOUS RADIATION THERAPY: No  PAST MEDICAL HISTORY:  Past Medical History:  Diagnosis Date   Anxiety    Arthritis    knees   Depression    Diabetes mellitus without complication ([place])    Type 2   Family history of breast cancer 02/28/2020   Headache    migraines   History of pneumonia    Numbness    "left foot"    Poor circulation     PAST SURGICAL HISTORY: Past Surgical History:  Procedure Laterality Date   ABCESS DRAINAGE     "after cesarean- in hospital for 3 months"   ABDOMINAL HYSTERECTOMY     CESAREAN SECTION  1970   CHOLECYSTECTOMY     COLONOSCOPY     EYE SURGERY Right    MENISCUS REPAIR Bilateral    SPINE SURGERY      FAMILY HISTORY:  Family History  Problem Relation Age of Onset   Breast cancer Cousin        maternal; dx and d. <50   Breast cancer Cousin        maternal; dx and d. <50   Breast cancer Cousin        maternal; dx and d. <50   Breast cancer Cousin        maternal; dx and d. <50   Cancer Paternal Aunt  unknown type cancer dx 72s    SOCIAL HISTORY:  Social History   Tobacco Use   Smoking status: Former Smoker    Packs/day: 0.50    Years: 10.00    Pack years: 5.00    Quit date: 07/11/1986    Years since quitting: 33.6   Smokeless tobacco: Never Used  Substance Use Topics   Alcohol use: Yes    Comment: occassionally   Drug use: No    ALLERGIES: No Known Allergies  MEDICATIONS:  Current Outpatient Medications  Medication Sig Dispense Refill    ALPRAZolam (XANAX) 0.5 MG tablet Take 0.5 mg by mouth 2 (two) times daily as needed.     baclofen (LIORESAL) 10 MG tablet Take 0.5-1 tablets (5-10 mg total) by mouth at bedtime as needed for muscle spasms. 30 each 3   calcium-vitamin D 250-100 MG-UNIT tablet Take 1 tablet by mouth daily.     Cholecalciferol (VITAMIN D-3 PO) Take 1 tablet by mouth daily.     gabapentin (NEURONTIN) 100 MG capsule 1 PO q HS, may increase to 3 PO q HS if needed 90 capsule 3   NOVOFINE 32G X 6 MM MISC AS DIRECTED ONCE A DAY WITH TRESIBA FLEXTOUCH     ONETOUCH DELICA LANCETS 73X MISC 2 (two) times daily. for testing  2   ONETOUCH VERIO test strip USE AS DIRECTED TO TEST BLOOD SUGAR TWICE A DAY  3   sertraline (ZOLOFT) 100 MG tablet Take 100 mg by mouth daily.     sitaGLIPtin (JANUVIA) 100 MG tablet Take 100 mg by mouth daily.     TRESIBA FLEXTOUCH 100 UNIT/ML SOPN FlexTouch Pen SMARTSIG:10 Unit(s) SUB-Q Every Morning     Triamcinolone Acetonide (NASACORT ALLERGY 24HR NA) Place 1 spray into both nostrils daily as needed (for allergies).     No current facility-administered medications for this encounter.    REVIEW OF SYSTEMS:  A 10+ POINT REVIEW OF SYSTEMS WAS OBTAINED including neurology, dermatology, psychiatry, cardiac, respiratory, lymph, extremities, GI, GU, musculoskeletal, constitutional, reproductive, HEENT.  She denies any pain within the breast area nipple discharge or bleeding prior to diagnosis.   PHYSICAL EXAM:  height is 5\' 2"  (1.575 m) and weight is 158 lb 2 oz (71.7 kg). Her oral temperature is 98.2 F (36.8 C). Her blood pressure is 142/98 (abnormal) and her pulse is 60. Her respiration is 20 and oxygen saturation is 97%.   General: Alert and oriented, in no acute distress HEENT: Head is normocephalic. Extraocular movements are intact. Oropharynx is clear. Neck: Neck is supple, no palpable cervical or supraclavicular lymphadenopathy. Heart: Regular in rate and rhythm with no murmurs, rubs,  or gallops. Chest: Clear to auscultation bilaterally, with no rhonchi, wheezes, or rales. Abdomen: Soft, nontender, nondistended, with no rigidity or guarding. Extremities: No cyanosis or edema. Lymphatics: see Neck Exam Skin: No concerning lesions. Musculoskeletal: symmetric strength and muscle tone throughout. Neurologic: Cranial nerves II through XII are grossly intact. No obvious focalities. Speech is fluent. Coordination is intact. Psychiatric: Judgment and insight are intact. Affect is appropriate. Left breast: No palpable mass, nipple discharge, or bleeding.  Right breast: Small biopsy site present in the upper outer quadrant.  Some induration in the upper outer quadrant possibly related to her biopsy.  No discrete mass palpable.  No nipple discharge or bleeding  ECOG = 1  0 - Asymptomatic (Fully active, able to carry on all predisease activities without restriction)  1 - Symptomatic but completely ambulatory (Restricted in physically strenuous activity  but ambulatory and able to carry out work of a light or sedentary nature. For example, light housework, office work)  2 - Symptomatic, <50% in bed during the day (Ambulatory and capable of all self care but unable to carry out any work activities. Up and about more than 50% of waking hours)  3 - Symptomatic, >50% in bed, but not bedbound (Capable of only limited self-care, confined to bed or chair 50% or more of waking hours)  4 - Bedbound (Completely disabled. Cannot carry on any self-care. Totally confined to bed or chair)  5 - Death   Eustace Pen MM, Creech RH, Tormey DC, et al. [phone]). "Toxicity and response criteria of the [facility]". [place] Oncol. 5 (6): 649-55  LABORATORY DATA:  Lab Results  Component Value Date   WBC 6.6 02/28/2020   HGB 13.8 02/28/2020   HCT 44.7 02/28/2020   MCV 85.8 02/28/2020   PLT 209 02/28/2020   NEUTROABS 3.4 02/28/2020   Lab Results  Component Value Date   NA 143  02/28/2020   K 4.5 02/28/2020   CL 107 02/28/2020   CO2 26 02/28/2020   GLUCOSE 75 02/28/2020   CREATININE 0.73 02/28/2020   CALCIUM 9.9 02/28/2020      RADIOGRAPHY: MR BREAST BILATERAL W WO CONTRAST INC CAD  Result Date: 03/09/2020 CLINICAL DATA:  74 year old female with recently diagnosed high-grade ductal carcinoma in situ and LCIS of the right breast. LABS:  None performed on site. EXAM: BILATERAL BREAST MRI WITH AND WITHOUT CONTRAST TECHNIQUE: Multiplanar, multisequence MR images of both breasts were obtained prior to and following the intravenous administration of 7 ml of Gadavist. Three-dimensional MR images were rendered by post-processing of the original MR data on an independent workstation. The three-dimensional MR images were interpreted, and findings are reported in the following complete MRI report for this study. Three dimensional images were evaluated at the independent interpreting workstation using the DynaCAD thin client. COMPARISON:  Previous exam(s). FINDINGS: Breast composition: b. Scattered fibroglandular tissue. Background parenchymal enhancement: Mild. Right breast: Susceptibility artifact from post biopsy clip is seen just inferior along the posterior edge of irregular linear enhancement involving the upper-outer quadrant of the right breast from mid to posterior depth (series 10, image 75/144). This correlates with the patient's biopsy-proven site of disease and likely represents the span of the calcifications identified mammographically. An additional irregular, enhancing 5 mm focus is seen just anterior and slightly medial to the primary findings (series 10, image 81/144). The suspicious findings span approximately 4.2 x 2.8 cm in the AP by transverse dimensions. No additional suspicious findings in the remainder of the right breast. Left breast: No suspicious mass or abnormal enhancement. Lymph nodes: No abnormal appearing lymph nodes. Ancillary findings:  None. IMPRESSION:  1. Suspicious, linear enhancement involving the upper-outer quadrant of the right breast from mid to posterior depth. Susceptibility artifact from post-biopsy clip is seen along the posterior, inferior margin. The overall suspicious finding span approximately 4.2 x 2.8 cm in the AP by transverse dimensions. Recommendation is for MRI guided biopsy along the anterior margin of this enhancement, particularly at the irregular focus identified on series 10, image 81/144. 2. No suspicious MRI findings in the remainder of the right breast. 3. No MRI evidence of malignancy on the left. 4. No suspicious lymphadenopathy. RECOMMENDATION: MRI guided biopsy of the right breast (series 10, image 81/144). BI-RADS CATEGORY  4: Suspicious. Electronically Signed   By: Kristopher Oppenheim M.D.   On: 03/09/2020  11:41   MM Digital Diagnostic Unilat R  Result Date: 02/13/2020 CLINICAL DATA:  Patient returns today for evaluation of RIGHT breast calcifications identified on recent screening mammogram. EXAM: DIGITAL DIAGNOSTIC RIGHT MAMMOGRAM WITH CAD COMPARISON:  Previous exams including recent screening mammogram dated 01/24/2020. ACR Breast Density Category b: There are scattered areas of fibroglandular density. FINDINGS: Linear branching casting-type calcifications are confirmed within the upper outer quadrant of the RIGHT breast, measuring 2.8 cm extent. Mammographic images were processed with CAD. IMPRESSION: Highly suspicious calcifications within the upper-outer quadrant of the RIGHT breast, measuring 2.8 cm extent, for which stereotactic biopsy is recommended. RECOMMENDATION: Stereotactic biopsy for the RIGHT breast calcifications. Stereotactic biopsy is scheduled for 02/21/2020. I have discussed the findings and recommendations with the patient. If applicable, a reminder letter will be sent to the patient regarding the next appointment. BI-RADS CATEGORY  5: Highly suggestive of malignancy. Electronically Signed   By: Franki Cabot  M.D.   On: 02/13/2020 15:11   MM CLIP PLACEMENT RIGHT  Result Date: 02/17/2020 CLINICAL DATA:  Status post stereotactic guided core needle biopsy right breast calcifications. EXAM: DIAGNOSTIC RIGHT MAMMOGRAM POST STEREOTACTIC BIOPSY COMPARISON:  Previous exam(s). FINDINGS: Coil shaped marking clip was deployed at the site of biopsy. Note the coil shaped marking clip is located approximately 7 mm inferior to the site of calcifications within the outer right breast. IMPRESSION: Approximate 7 mm inferior migration of the coil shaped marking clip relative to the residual calcifications within the outer right breast. Final Assessment: Post Procedure Mammograms for Marker Placement Electronically Signed   By: Lovey Newcomer M.D.   On: 02/17/2020 14:43   MM RT BREAST BX W LOC DEV 1ST LESION IMAGE BX SPEC STEREO GUIDE  Addendum Date: 02/18/2020   ADDENDUM REPORT: 02/18/2020 14:53 ADDENDUM: Pathology revealed DUCTAL CARCINOMA IN SITU, HIGH-GRADE WITH NECROSIS AND CALCIFICATIONS, LOBULAR CARCINOMA IN SITU of the RIGHT breast, lateral. This was found to be concordant by Dr. Lovey Newcomer. Pathology results were discussed with the patient by telephone. The patient reported doing well after the biopsy with tenderness at the site. Post biopsy instructions and care were reviewed and questions were answered. The patient was encouraged to call [facility] for any additional concerns. Surgical consultation has been arranged with Dr. Nedra Hai at [facility] on February 25, 2020. Breast MRI suggested for extent of disease. Pathology results reported by Stacie Acres RN on 02/18/2020. Electronically Signed   By: Lovey Newcomer M.D.   On: 02/18/2020 14:53   Result Date: 02/18/2020 CLINICAL DATA:  Patient with indeterminate right breast calcifications. EXAM: RIGHT BREAST STEREOTACTIC CORE NEEDLE BIOPSY COMPARISON:  Previous exams. FINDINGS: The patient and I discussed the procedure of  stereotactic-guided biopsy including benefits and alternatives. We discussed the high likelihood of a successful procedure. We discussed the risks of the procedure including infection, bleeding, tissue injury, clip migration, and inadequate sampling. Informed written consent was given. The usual time out protocol was performed immediately prior to the procedure. Using sterile technique and 1% Lidocaine as local anesthetic, under stereotactic guidance, a 9 gauge vacuum assisted device was used to perform core needle biopsy of calcifications within the outer right breast using a cranial approach. Note in the original few specimen samples, no calcifications were identified. Repeat imaging of the right breast was obtained and it was noted that the calcifications were slightly posterior to the needle placement. The breast was repositioned and the calcifications were centered. Specimen radiograph was subsequently performed  showing calcifications in the additional samples. Specimens with calcifications are identified for pathology. Lesion quadrant: Upper outer quadrant At the conclusion of the procedure, coil shaped tissue marker clip was deployed into the biopsy cavity. Follow-up 2-view mammogram was performed and dictated separately. IMPRESSION: Stereotactic-guided biopsy of right breast calcifications. No apparent complications. Electronically Signed: By: Lovey Newcomer M.D. On: 02/17/2020 14:42      IMPRESSION: Stage 0 (cTis, cN0, cM0), Right Breast UOQ, DCIS and LCIS, ER+ / PR+, Grade 3  As above additional biopsies were recommended along the anterior portion of the biopsy-proven disease.  Depending on the results of this biopsy the patient may be a candidate for breast conserving surgery.  If she does have breast conserving surgery I would recommend adjuvant radiation therapy given the anticipated size of the lesion and the high-grade nature of the malignancy.  Given the location in the upper outer quadrant, the  size of the lesion and high-grade nature,  Dr. Ninfa Linden may possibly consider a sentinel node procedure in the event she is found to have invasive disease.  Today, I talked to the patient and husband about the findings and work-up thus far.  We discussed the natural history of breast cancer and general treatment, highlighting the role of radiotherapy in the management.  We discussed the available radiation techniques, and focused on the details of logistics and delivery.  We reviewed the anticipated acute and late sequelae associated with radiation in this setting.  The patient was encouraged to ask questions that I answered to the best of my ability.  A patient consent form was discussed and signed.  We retained a copy for our records.  The patient would like to proceed with radiation if medically feasible depending on additional biopsy results and upcoming surgical approach.  PLAN: Final surgical plans are dependent on upcoming MRI guided biopsy result along the anterior portion of the biopsy proven DCIS in the right breast.  Total time spent in this encounter was 60 minutes which included reviewing the patient's most recent mammogram, biopsy, consultations, genetic testing, bilateral breast MRI, physical examination, and documentation.    ------------------------------------------------  Blair Promise, PhD, MD  This document serves as a record of services personally performed by Gery Pray, MD. It was created on his behalf by Clerance Lav, a trained medical scribe. The creation of this record is based on the scribe's personal observations and the provider's statements to them. This document has been checked and approved by the attending provider.

## 2020-03-10 ENCOUNTER — Encounter: Payer: Self-pay | Admitting: Genetic Counselor

## 2020-03-10 ENCOUNTER — Ambulatory Visit: Payer: Self-pay | Admitting: Genetic Counselor

## 2020-03-10 DIAGNOSIS — Z170 Estrogen receptor positive status [ER+]: Secondary | ICD-10-CM

## 2020-03-10 DIAGNOSIS — Z1379 Encounter for other screening for genetic and chromosomal anomalies: Secondary | ICD-10-CM | POA: Insufficient documentation

## 2020-03-10 DIAGNOSIS — Z803 Family history of malignant neoplasm of breast: Secondary | ICD-10-CM

## 2020-03-10 DIAGNOSIS — C50411 Malignant neoplasm of upper-outer quadrant of right female breast: Secondary | ICD-10-CM

## 2020-03-10 NOTE — Progress Notes (Signed)
HPI:  [name] was previously seen in the [place] clinic due to a personal history of DCIS, a family history off breast cancer, and concerns regarding a hereditary predisposition to cancer. Please refer to our prior cancer genetics clinic note for more information regarding our discussion, assessment and recommendations, at the time. [name] recent genetic test results were disclosed to her, as were recommendations warranted by these results. These results and recommendations are discussed in more detail below.  CANCER HISTORY:  Oncology History Overview Note  Cancer Staging Malignant neoplasm of upper-outer quadrant of right breast in female, estrogen receptor positive ([place]) Staging form: Breast, AJCC 8th Edition - Clinical stage from 02/17/2020: Stage 0 (cTis (DCIS), cN0, cM0, ER+, PR+) - Signed by Gardenia Phlegm, NP on 02/26/2020    Malignant neoplasm of upper-outer quadrant of right breast in female, estrogen receptor positive ([place])  02/13/2020 Mammogram   IMPRESSION: Highly suspicious calcifications within the upper-outer quadrant of the RIGHT breast, measuring 2.8 cm extent, for which stereotactic biopsy is recommended.   02/17/2020 Cancer Staging   Staging form: Breast, AJCC 8th Edition - Clinical stage from 02/17/2020: Stage 0 (cTis (DCIS), cN0, cM0, ER+, PR+) - Signed by Gardenia Phlegm, NP on 02/26/2020   02/17/2020 Initial Biopsy   Diagnosis Breast, right, needle core biopsy, lateral - DUCTAL CARCINOMA IN SITU, HIGH-GRADE WITH NECROSIS AND CALCIFICATIONS. SEE NOTE - LOBULAR CARCINOMA IN SITU Diagnosis Note DCIS measures 0.5 cm in greatest linear dimension. Dr. Saralyn Pilar reviewed the case and concurs with the diagnosis. A breast prognostic profile (ER, PR) is pending and will be reported in an addendum. The [facility] was notified on 02/18/2020.   02/17/2020 Receptors her2   PROGNOSTIC  INDICATORS Results: IMMUNOHISTOCHEMICAL AND MORPHOMETRIC ANALYSIS PERFORMED MANUALLY Estrogen Receptor: 95%, POSITIVE, MODERATE STAINING INTENSITY Progesterone Receptor: 70%, POSITIVE, MODERATE STAINING INTENSITY   02/26/2020 Initial Diagnosis   Malignant neoplasm of upper-outer quadrant of right breast in female, estrogen receptor positive ([place])   03/08/2020 Genetic Testing   No pathogenic variants detected in [facility] Common Hereditary Cancers Panel. The Common Hereditary Cancers Panel offered by [facility] includes sequencing and/or deletion duplication testing of the following 48 genes: APC, ATM, AXIN2, BARD1, BMPR1A, BRCA1, BRCA2, BRIP1, CDH1, CDK4, CDKN2A (p14ARF), CDKN2A (p16INK4a), CHEK2, CTNNA1, DICER1, EPCAM (Deletion/duplication testing only), GREM1 (promoter region deletion/duplication testing only), KIT, MEN1, MLH1, MSH2, MSH3, MSH6, MUTYH, NBN, NF1, NHTL1, PALB2, PDGFRA, PMS2, POLD1, POLE, PTEN, RAD50, RAD51C, RAD51D, RNF43, SDHB, SDHC, SDHD, SMAD4, SMARCA4. STK11, TP53, TSC1, TSC2, and VHL.  The following genes were evaluated for sequence changes only: SDHA and HOXB13 c.251G>A variant only. The report date is March 08, 2020.     FAMILY HISTORY:  We obtained a detailed, 4-generation family history.  Significant diagnoses are listed below: Family History  Problem Relation Age of Onset  . Breast cancer Cousin        maternal; dx and d. <50  . Breast cancer Cousin        maternal; dx and d. <50  . Breast cancer Cousin        maternal; dx and d. <50  . Breast cancer Cousin        maternal; dx and d. <50  . Cancer Paternal Aunt        unknown type cancer dx 36s        [name] has one living son who is 90 years old.  [name] reports that he mentioned that he  may need a prostate evaluation in the near future due to concern for cancer brought up by his physician.  [name] mother passed away at age 20 from a cause unrelated to cancer.  [name] maternal aunt was  diagnosed with an unknown cancer in her 60s and passed away in her 54s.  [name] has had four maternal cousins with breast cancer, diagnosed before the age of 56, who all passed away before the age of 26.  Two of these maternal cousins with breast cancer have had daughters who have had breast cancer as well.  [name] father passed away at age 56 from a cause unrelated to cancer.  [name] has one paternal aunt with lung cancer diagnosed and deceased in her 15s-70s.  [name] also has one paternal cousin with throat cancer in his 68s.  No other family history of cancer was reported.   [name] is unaware of previous family history of genetic testing for hereditary cancer risks. Patient's maternal ancestors are of African American descent, and paternal ancestors are of African American descent. There is no reported Ashkenazi Jewish ancestry. There is no known consanguinity.   GENETIC TEST RESULTS: Genetic testing reported out on March 08, 2020. The [facility] Common Hereditary Cancers Panel found no pathogenic mutations. The Common Hereditary Cancers Panel offered by [facility] includes sequencing and/or deletion duplication testing of the following 48 genes: APC, ATM, AXIN2, BARD1, BMPR1A, BRCA1, BRCA2, BRIP1, CDH1, CDK4, CDKN2A (p14ARF), CDKN2A (p16INK4a), CHEK2, CTNNA1, DICER1, EPCAM (Deletion/duplication testing only), GREM1 (promoter region deletion/duplication testing only), KIT, MEN1, MLH1, MSH2, MSH3, MSH6, MUTYH, NBN, NF1, NHTL1, PALB2, PDGFRA, PMS2, POLD1, POLE, PTEN, RAD50, RAD51C, RAD51D, RNF43, SDHB, SDHC, SDHD, SMAD4, SMARCA4. STK11, TP53, TSC1, TSC2, and VHL.  The following genes were evaluated for sequence changes only: SDHA and HOXB13 c.251G>A variant only. The test report has been scanned into EPIC and is located under the Molecular Pathology section of the Results Review tab.  A portion of the result report is included below for reference.     We discussed with [name] that  because current genetic testing is not perfect, it is possible there may be a gene mutation in one of these genes that current testing cannot detect, but that chance is small.  We also discussed, that there could be another gene that has not yet been discovered, or that we have not yet tested, that is responsible for the cancer diagnoses in the family. It is also possible there is a hereditary cause for the cancer in the family that [name] did not inherit and therefore was not identified in her testing.  Therefore, it is important to remain in touch with cancer genetics in the future so that we can continue to offer [name] the most up to date genetic testing.    ADDITIONAL GENETIC TESTING: We discussed with [name] that there are other genes that are associated with increased cancer risk that can be analyzed. Should [name] wish to pursue additional genetic testing, we are happy to discuss and coordinate this testing, at any time.    CANCER SCREENING RECOMMENDATIONS: [name] test result is considered negative (normal).  This means that we have not identified a hereditary cause for her personal history of DCIS and family history of breast cancer at this time. Most cancers happen by chance and this negative test suggests that her cancer may fall into this category.    While reassuring, this does not definitively rule out  a hereditary predisposition to cancer. It is still possible that there could be genetic mutations that are undetectable by current technology. There could be genetic mutations in genes that have not been tested or identified to increase cancer risk.  Therefore, it is recommended she continue to follow the cancer management and screening guidelines provided by her oncology and primary healthcare provider.   An individual's cancer risk and medical management are not determined by genetic test results alone. Overall cancer risk assessment incorporates additional factors,  including personal medical history, family history, and any available genetic information that may result in a personalized plan for cancer prevention and surveillance.   RECOMMENDATIONS FOR FAMILY MEMBERS:  Individuals in this family might be at some increased risk of developing cancer, over the general population risk, simply due to the family history of cancer.  We recommended women in this family have a yearly mammogram beginning at age 107, or 9 years younger than the earliest onset of cancer, an annual clinical breast exam, and perform monthly breast self-exams. Women in this family should also have a gynecological exam as recommended by their primary provider. All family members should be referred for colonoscopy starting at age 86.  It is also possible there is a hereditary cause for the cancer in [name]'s family that she did not inherit and therefore was not identified in her.  Based on [name]'s family history, we recommended her maternal cousins diagnosed with breast cancer have genetic counseling and testing. [name] will let us know if we can be of any assistance in coordinating genetic counseling and/or testing for these family members.   FOLLOW-UP: Lastly, we discussed with [name] that cancer genetics is a rapidly advancing field and it is possible that new genetic tests will be appropriate for her and/or her family members in the future. We encouraged her to remain in contact with cancer genetics on an annual basis so we can update her personal and family histories and let her know of advances in cancer genetics that may benefit this family.   Our contact number was provided. [name] questions were answered to her satisfaction, and she knows she is welcome to call us at anytime with additional questions or concerns.    Lebert Lovern M. Joette Catching, [place], [place] [email or web address] [email or web address] (P) [phone]

## 2020-03-11 ENCOUNTER — Encounter: Payer: Self-pay | Admitting: *Deleted

## 2020-03-11 ENCOUNTER — Ambulatory Visit
Admission: RE | Admit: 2020-03-11 | Discharge: 2020-03-11 | Disposition: A | Discharge status: Home / Self Care | Payer: Medicare Other | Source: Ambulatory Visit | Attending: Radiation Oncology | Admitting: Radiation Oncology

## 2020-03-11 ENCOUNTER — Encounter: Payer: Self-pay | Admitting: Radiation Oncology

## 2020-03-11 ENCOUNTER — Other Ambulatory Visit: Payer: Self-pay

## 2020-03-11 VITALS — BP 142/98 | HR 60 | Temp 98.2°F | Resp 20 | Ht 62.0 in | Wt 158.1 lb

## 2020-03-11 DIAGNOSIS — F418 Other specified anxiety disorders: Secondary | ICD-10-CM | POA: Diagnosis not present

## 2020-03-11 DIAGNOSIS — Z8701 Personal history of pneumonia (recurrent): Secondary | ICD-10-CM | POA: Diagnosis not present

## 2020-03-11 DIAGNOSIS — R200 Anesthesia of skin: Secondary | ICD-10-CM | POA: Diagnosis not present

## 2020-03-11 DIAGNOSIS — Z803 Family history of malignant neoplasm of breast: Secondary | ICD-10-CM | POA: Insufficient documentation

## 2020-03-11 DIAGNOSIS — C50411 Malignant neoplasm of upper-outer quadrant of right female breast: Secondary | ICD-10-CM | POA: Insufficient documentation

## 2020-03-11 DIAGNOSIS — M129 Arthropathy, unspecified: Secondary | ICD-10-CM | POA: Diagnosis not present

## 2020-03-11 DIAGNOSIS — Z923 Personal history of irradiation: Secondary | ICD-10-CM | POA: Insufficient documentation

## 2020-03-11 DIAGNOSIS — E119 Type 2 diabetes mellitus without complications: Secondary | ICD-10-CM | POA: Insufficient documentation

## 2020-03-11 DIAGNOSIS — Z87891 Personal history of nicotine dependence: Secondary | ICD-10-CM | POA: Insufficient documentation

## 2020-03-11 DIAGNOSIS — Z170 Estrogen receptor positive status [ER+]: Secondary | ICD-10-CM

## 2020-03-11 DIAGNOSIS — Z79899 Other long term (current) drug therapy: Secondary | ICD-10-CM | POA: Diagnosis not present

## 2020-03-11 DIAGNOSIS — C801 Malignant (primary) neoplasm, unspecified: Secondary | ICD-10-CM

## 2020-03-11 HISTORY — DX: Malignant (primary) neoplasm, unspecified: C80.1

## 2020-03-11 NOTE — Progress Notes (Signed)
[facility] Psychosocial Distress Screening Clinical Social Work  Clinical Social Work was referred by distress screening protocol.  The patient scored a 8 on the Psychosocial Distress Thermometer which indicates moderate distress. See previous Clinical Social Work note (8/24).  CSW has spoken with patient and provided appropriate resources and interventions.  Patient has CSW contact information and knows to contact CSW with needs or concerns.    ONCBCN DISTRESS SCREENING 03/11/2020  Distress experienced in past week (1-10) 8  Referral to clinical social work Yes  Other patient open to the social worker calling her    Johnnye Lana, MSW, LCSW, OSW-C Clinical Social Worker [employer] [phone]

## 2020-03-12 ENCOUNTER — Other Ambulatory Visit: Payer: Self-pay | Admitting: Surgery

## 2020-03-12 ENCOUNTER — Encounter: Payer: Self-pay | Admitting: *Deleted

## 2020-03-12 DIAGNOSIS — R9389 Abnormal findings on diagnostic imaging of other specified body structures: Secondary | ICD-10-CM

## 2020-03-13 ENCOUNTER — Encounter: Payer: Self-pay | Admitting: Genetic Counselor

## 2020-03-20 ENCOUNTER — Other Ambulatory Visit: Payer: Self-pay

## 2020-03-20 ENCOUNTER — Other Ambulatory Visit (HOSPITAL_COMMUNITY): Payer: Self-pay | Admitting: Diagnostic Radiology

## 2020-03-20 ENCOUNTER — Ambulatory Visit
Admission: RE | Admit: 2020-03-20 | Discharge: 2020-03-20 | Disposition: A | Discharge status: Home / Self Care | Payer: Medicare Other | Source: Ambulatory Visit | Attending: Surgery | Admitting: Surgery

## 2020-03-20 DIAGNOSIS — R9389 Abnormal findings on diagnostic imaging of other specified body structures: Secondary | ICD-10-CM

## 2020-03-20 DIAGNOSIS — D0501 Lobular carcinoma in situ of right breast: Secondary | ICD-10-CM | POA: Diagnosis not present

## 2020-03-20 DIAGNOSIS — D0511 Intraductal carcinoma in situ of right breast: Secondary | ICD-10-CM | POA: Diagnosis not present

## 2020-03-20 MED ORDER — GADOBUTROL 1 MMOL/ML IV SOLN
7.0000 mL | Freq: Once | INTRAVENOUS | Status: AC | PRN
  Administered 2020-03-20: 7 mL via INTRAVENOUS

## 2020-03-24 ENCOUNTER — Encounter: Payer: Self-pay | Admitting: *Deleted

## 2020-03-25 ENCOUNTER — Other Ambulatory Visit: Payer: Self-pay | Admitting: Surgery

## 2020-03-25 DIAGNOSIS — Z853 Personal history of malignant neoplasm of breast: Secondary | ICD-10-CM

## 2020-03-26 ENCOUNTER — Encounter: Payer: Self-pay | Admitting: *Deleted

## 2020-03-30 ENCOUNTER — Telehealth: Payer: Self-pay | Admitting: Hematology

## 2020-03-30 ENCOUNTER — Encounter: Payer: Self-pay | Admitting: *Deleted

## 2020-03-30 NOTE — Telephone Encounter (Signed)
Scheduled per 9/16 sch message. Unable to reach pt. Left voicemail with appt time and date.

## 2020-03-31 DIAGNOSIS — E119 Type 2 diabetes mellitus without complications: Secondary | ICD-10-CM | POA: Diagnosis not present

## 2020-04-06 ENCOUNTER — Other Ambulatory Visit: Payer: Self-pay

## 2020-04-06 ENCOUNTER — Encounter (HOSPITAL_BASED_OUTPATIENT_CLINIC_OR_DEPARTMENT_OTHER): Payer: Self-pay | Admitting: Surgery

## 2020-04-09 ENCOUNTER — Encounter (HOSPITAL_BASED_OUTPATIENT_CLINIC_OR_DEPARTMENT_OTHER)
Admission: RE | Admit: 2020-04-09 | Discharge: 2020-04-09 | Disposition: A | Discharge status: Home / Self Care | Payer: Medicare Other | Source: Ambulatory Visit | Attending: Surgery | Admitting: Surgery

## 2020-04-09 ENCOUNTER — Other Ambulatory Visit (HOSPITAL_COMMUNITY)
Admission: RE | Admit: 2020-04-09 | Discharge: 2020-04-09 | Disposition: A | Discharge status: Home / Self Care | Payer: Medicare Other | Source: Ambulatory Visit | Attending: Surgery | Admitting: Surgery

## 2020-04-09 DIAGNOSIS — Z20822 Contact with and (suspected) exposure to covid-19: Secondary | ICD-10-CM | POA: Diagnosis not present

## 2020-04-09 DIAGNOSIS — Z01818 Encounter for other preprocedural examination: Secondary | ICD-10-CM | POA: Insufficient documentation

## 2020-04-09 DIAGNOSIS — Z01812 Encounter for preprocedural laboratory examination: Secondary | ICD-10-CM | POA: Diagnosis not present

## 2020-04-09 LAB — BASIC METABOLIC PANEL
Anion gap: 11 (ref 5–15)
BUN: 9 mg/dL (ref 8–23)
CO2: 22 mmol/L (ref 22–32)
Calcium: 9 mg/dL (ref 8.9–10.3)
Chloride: 107 mmol/L (ref 98–111)
Creatinine, Ser: 0.61 mg/dL (ref 0.44–1.00)
GFR calc Af Amer: >60 mL/min (ref >60)
GFR calc non Af Amer: >60 mL/min (ref >60)
Glucose, Bld: 74 mg/dL (ref 70–99)
Potassium: 4.5 mmol/L (ref 3.5–5.1)
Sodium: 140 mmol/L (ref 135–145)

## 2020-04-09 LAB — SARS CORONAVIRUS 2 (TAT 6-24 HRS): SARS Coronavirus 2: NEGATIVE

## 2020-04-09 MED ORDER — ENSURE PRE-SURGERY PO LIQD: 296.0000 mL | Freq: Once | ORAL | Status: DC

## 2020-04-09 NOTE — Progress Notes (Signed)

## 2020-04-12 NOTE — H&P (Signed)
Judith Chavez  Location: [facility] Surgery Patient #: [id number] DOB: Oct 25, 1945 Married / Language: English / Race: Black or African American Female   History of Present Illness  The patient is a 74 year old female who presents with breast cancer.  Chief complaint: Right breast cancer  This is a 74 year old female who is here today for evaluation of a recently diagnosed cancer of the right breast. She was found on screening mammography to have multiple calcifications in the upper outer quadrant of the right breast. She underwent biopsy of these showing both DCIS with necrosis and LCIS. The area of calcifications measured 2.7 cm. She has had no previous problems with her breasts. There is a family history of multiple cousins having breast cancer in her mother's side of the family. She denies nipple discharge. She is otherwise healthy without complaints. She plays tennis regularly.   Past Surgical History Breast Biopsy  Right. Cesarean Section - 1  Hysterectomy (due to cancer) - Complete  Hysterectomy (not due to cancer) - Complete  Knee Surgery  Bilateral. Spinal Surgery - Lower Back   Diagnostic Studies History Colonoscopy  1-5 years ago Mammogram  within last year Pap Smear  1-5 years ago  Allergies  Allergies Reconciled   Medication History Sabino Gasser, [place];  Tyler Aas FlexTouch (100UNIT/ML Soln Pen-inj, Subcutaneous) Active. Sertraline HCl (100MG  Tablet, Oral) Active. Januvia (100MG  Tablet, Oral) Active. OneTouch Verio (In Vitro) Active. Medications Reconciled  Social History Alcohol use  Occasional alcohol use. Caffeine use  Coffee. No drug use  Tobacco use  Former smoker.  Family History Sabino Gasser, [place] Arthritis  Father, Mother. Cerebrovascular Accident  Father. Diabetes Mellitus  Father. Heart disease in female family member before age 58  Hypertension  Father. Seizure disorder  Father.  Pregnancy / Birth History  Sabino Gasser, [place]; Age at menarche  26 years. Age of menopause  <45 Gravida  2 Maternal age  39-25 Para  1  Other Problems Sabino Gasser, [place];  Anxiety Disorder  Arthritis  Back Pain  Breast Cancer  Depression  Diabetes Mellitus  Gastroesophageal Reflux Disease     Review of Systems  General Present- Fatigue. Not Present- Appetite Loss, Chills, Fever, Night Sweats, Weight Gain and Weight Loss. Skin Present- Dryness and Non-Healing Wounds. Not Present- Change in Wart/Mole, Hives, Jaundice, New Lesions, Rash and Ulcer. HEENT Present- Hoarseness and Wears glasses/contact lenses. Not Present- Earache, Hearing Loss, Nose Bleed, Oral Ulcers, Ringing in the Ears, Seasonal Allergies, Sinus Pain, Sore Throat, Visual Disturbances and Yellow Eyes. Cardiovascular Present- Difficulty Breathing Lying Down, Leg Cramps and Swelling of Extremities. Not Present- Chest Pain, Palpitations, Rapid Heart Rate and Shortness of Breath. Gastrointestinal Present- Constipation and Nausea. Not Present- Abdominal Pain, Bloating, Bloody Stool, Change in Bowel Habits, Chronic diarrhea, Difficulty Swallowing, Excessive gas, Gets full quickly at meals, Hemorrhoids, Indigestion, Rectal Pain and Vomiting. Female Genitourinary Present- Urgency. Not Present- Frequency, Nocturia, Painful Urination and Pelvic Pain. Musculoskeletal Present- Back Pain, Joint Pain, Muscle Pain and Swelling of Extremities. Not Present- Joint Stiffness and Muscle Weakness. Neurological Present- Headaches and Numbness. Not Present- Decreased Memory, Fainting, Seizures, Tingling, Tremor, Trouble walking and Weakness. Psychiatric Present- Depression. Not Present- Anxiety, Bipolar, Change in Sleep Pattern, Fearful and Frequent crying. Endocrine Present- Hot flashes and New Diabetes. Not Present- Cold Intolerance, Excessive Hunger, Hair Changes and Heat Intolerance.  Vitals (  Weight: 160.8 lb Height: 62in Body Surface Area: 1.74  m Body Mass Index: 29.41 kg/m  Temp.: 97.68F (Tympanic)  Pulse: 72 (Regular)  BP: 112/72(Sitting, Left Arm, Standard)    Physical Exam The physical exam findings are as follows: Note: On exam, she is a well-developed female in no acute distress  There are no palpable breast masses and no axillary adenopathy on either side. She has mild ecchymosis from the right breast biopsy.  She has multiple well-healed incisions on her abdomen    Assessment & Plan  DUCTAL CARCINOMA IN SITU (DCIS) OF RIGHT BREAST WITH COMEDONECROSIS (D05.11) LOBULAR CARCINOMA IN SITU (LCIS) OF RIGHT BREAST (D05.01  Impression: I reviewed her mammograms, ultrasound, pathology results and notes in the electronic medical records. She is accompanied by her husband. She has both DCIS and LCIS of the right breast. I gave him a copy of the pathology results and we discussed the diagnosis in detail. We discussed the range of breast cancer. We discussed multiple treatment options including breast conservation and mastectomy. At this point, given the LCIS, MRI of her breast is recommended to evaluate the extent of disease and determine whether or not further biopsies or mastectomy is needed versus a radioactive seed guided lumpectomy. I then discussed breast conservation with her including a radioactive seed guided right breast lumpectomy. We discussed procedure in detail. We discussed the risks which include but not limited to bleeding, infection, need for further surgery if the margins are positive, cardiopulmonary issues, postoperative recovery, etc. She will also be discussed in multidisciplinary breast cancer conference. Further plans will be determined based on MRI. She is also being treated with medical and radiation oncology.  Addendum: The MRI showed the area of concern in the right breast measure  4.2  X 2.8 cm.  Biopsies showed more LCIS.  After a long discussion with the patient, given the extent of her  disease in the right breast, she has elected to undergo a right mastectomy without reconstruction and sentinel node biopsy.

## 2020-04-13 ENCOUNTER — Ambulatory Visit (HOSPITAL_BASED_OUTPATIENT_CLINIC_OR_DEPARTMENT_OTHER): Payer: Medicare Other | Admitting: Certified Registered"

## 2020-04-13 ENCOUNTER — Other Ambulatory Visit: Payer: Self-pay

## 2020-04-13 ENCOUNTER — Observation Stay (HOSPITAL_BASED_OUTPATIENT_CLINIC_OR_DEPARTMENT_OTHER)
Admission: RE | Admit: 2020-04-13 | Discharge: 2020-04-14 | Disposition: A | Discharge status: Home / Self Care | Payer: Medicare Other | Attending: Surgery | Admitting: Surgery

## 2020-04-13 ENCOUNTER — Encounter (HOSPITAL_BASED_OUTPATIENT_CLINIC_OR_DEPARTMENT_OTHER): Payer: Self-pay | Admitting: Surgery

## 2020-04-13 ENCOUNTER — Ambulatory Visit (HOSPITAL_COMMUNITY)
Admission: RE | Admit: 2020-04-13 | Discharge: 2020-04-13 | Disposition: A | Discharge status: Home / Self Care | Payer: Medicare Other | Source: Ambulatory Visit | Attending: Surgery | Admitting: Surgery

## 2020-04-13 ENCOUNTER — Encounter (HOSPITAL_BASED_OUTPATIENT_CLINIC_OR_DEPARTMENT_OTHER)
Admission: RE | Disposition: A | Discharge status: Home / Self Care | Payer: Self-pay | Source: Home / Self Care | Attending: Surgery

## 2020-04-13 DIAGNOSIS — D0501 Lobular carcinoma in situ of right breast: Secondary | ICD-10-CM | POA: Diagnosis not present

## 2020-04-13 DIAGNOSIS — D0511 Intraductal carcinoma in situ of right breast: Secondary | ICD-10-CM | POA: Diagnosis not present

## 2020-04-13 DIAGNOSIS — E119 Type 2 diabetes mellitus without complications: Secondary | ICD-10-CM | POA: Diagnosis not present

## 2020-04-13 DIAGNOSIS — Z853 Personal history of malignant neoplasm of breast: Secondary | ICD-10-CM

## 2020-04-13 DIAGNOSIS — N6489 Other specified disorders of breast: Secondary | ICD-10-CM | POA: Diagnosis not present

## 2020-04-13 DIAGNOSIS — Z87891 Personal history of nicotine dependence: Secondary | ICD-10-CM | POA: Diagnosis not present

## 2020-04-13 DIAGNOSIS — N641 Fat necrosis of breast: Secondary | ICD-10-CM | POA: Diagnosis not present

## 2020-04-13 DIAGNOSIS — G8918 Other acute postprocedural pain: Secondary | ICD-10-CM | POA: Diagnosis not present

## 2020-04-13 DIAGNOSIS — F418 Other specified anxiety disorders: Secondary | ICD-10-CM | POA: Diagnosis not present

## 2020-04-13 HISTORY — PX: SIMPLE MASTECTOMY WITH AXILLARY SENTINEL NODE BIOPSY: SHX6098

## 2020-04-13 LAB — GLUCOSE, CAPILLARY
Glucose-Capillary: 87 mg/dL (ref 70–99)
Glucose-Capillary: 80 mg/dL (ref 70–99)

## 2020-04-13 SURGERY — SIMPLE MASTECTOMY WITH AXILLARY SENTINEL NODE BIOPSY
Anesthesia: General | Site: Breast | Laterality: Right

## 2020-04-13 MED ORDER — ONDANSETRON 4 MG PO TBDP: 4.0000 mg | ORAL_TABLET | Freq: Four times a day (QID) | ORAL | Status: DC | PRN

## 2020-04-13 MED ORDER — SODIUM CHLORIDE 0.9 % IV SOLN: INTRAVENOUS | Status: DC

## 2020-04-13 MED ORDER — ONDANSETRON HCL 4 MG/2ML IJ SOLN
INTRAMUSCULAR | Status: DC | PRN
  Administered 2020-04-13: 4 mg via INTRAVENOUS

## 2020-04-13 MED ORDER — ACETAMINOPHEN 500 MG PO TABS
ORAL_TABLET | ORAL | Status: AC
  Filled 2020-04-13: qty 2

## 2020-04-13 MED ORDER — DEXAMETHASONE SODIUM PHOSPHATE 10 MG/ML IJ SOLN
INTRAMUSCULAR | Status: DC | PRN
  Administered 2020-04-13: 5 mg via INTRAVENOUS

## 2020-04-13 MED ORDER — HYDROCODONE-ACETAMINOPHEN 5-325 MG PO TABS
1.0000 | ORAL_TABLET | ORAL | Status: DC | PRN
  Administered 2020-04-13 – 2020-04-14 (×2): 1 via ORAL
  Filled 2020-04-13 (×2): qty 1

## 2020-04-13 MED ORDER — AMISULPRIDE (ANTIEMETIC) 5 MG/2ML IV SOLN: 10.0000 mg | Freq: Once | INTRAVENOUS | Status: DC | PRN

## 2020-04-13 MED ORDER — ENOXAPARIN SODIUM 30 MG/0.3ML ~~LOC~~ SOLN
30.0000 mg | SUBCUTANEOUS | Status: DC
  Filled 2020-04-13: qty 0.3

## 2020-04-13 MED ORDER — FENTANYL CITRATE (PF) 100 MCG/2ML IJ SOLN
INTRAMUSCULAR | Status: AC
  Filled 2020-04-13: qty 2

## 2020-04-13 MED ORDER — LACTATED RINGERS IV SOLN: INTRAVENOUS | Status: DC

## 2020-04-13 MED ORDER — OXYCODONE HCL 5 MG/5ML PO SOLN: 5.0000 mg | Freq: Once | ORAL | Status: DC | PRN

## 2020-04-13 MED ORDER — GABAPENTIN 100 MG PO CAPS
ORAL_CAPSULE | ORAL | Status: AC
  Filled 2020-04-13: qty 1

## 2020-04-13 MED ORDER — EPHEDRINE SULFATE 50 MG/ML IJ SOLN
INTRAMUSCULAR | Status: DC | PRN
  Administered 2020-04-13: 10 mg via INTRAVENOUS
  Administered 2020-04-13: 15 mg via INTRAVENOUS
  Administered 2020-04-13: 10 mg via INTRAVENOUS

## 2020-04-13 MED ORDER — MIDAZOLAM HCL 2 MG/2ML IJ SOLN
INTRAMUSCULAR | Status: AC
  Filled 2020-04-13: qty 2

## 2020-04-13 MED ORDER — LIDOCAINE 2% (20 MG/ML) 5 ML SYRINGE
INTRAMUSCULAR | Status: AC
  Filled 2020-04-13: qty 5

## 2020-04-13 MED ORDER — PROMETHAZINE HCL 25 MG/ML IJ SOLN: 6.2500 mg | INTRAMUSCULAR | Status: DC | PRN

## 2020-04-13 MED ORDER — FENTANYL CITRATE (PF) 100 MCG/2ML IJ SOLN
50.0000 ug | Freq: Once | INTRAMUSCULAR | Status: AC
  Administered 2020-04-13: 50 ug via INTRAVENOUS

## 2020-04-13 MED ORDER — ALPRAZOLAM 0.25 MG PO TABS
0.5000 mg | ORAL_TABLET | Freq: Two times a day (BID) | ORAL | Status: DC | PRN
  Administered 2020-04-13: 0.5 mg via ORAL
  Filled 2020-04-13: qty 2

## 2020-04-13 MED ORDER — METHOCARBAMOL 500 MG PO TABS: 500.0000 mg | ORAL_TABLET | Freq: Four times a day (QID) | ORAL | Status: DC | PRN

## 2020-04-13 MED ORDER — 0.9 % SODIUM CHLORIDE (POUR BTL) OPTIME
TOPICAL | Status: DC | PRN
  Administered 2020-04-13: 1000 mL

## 2020-04-13 MED ORDER — PROPOFOL 10 MG/ML IV BOLUS
INTRAVENOUS | Status: DC | PRN
  Administered 2020-04-13: 200 mg via INTRAVENOUS

## 2020-04-13 MED ORDER — MIDAZOLAM HCL 2 MG/2ML IJ SOLN
2.0000 mg | Freq: Once | INTRAMUSCULAR | Status: AC
  Administered 2020-04-13: 2 mg via INTRAVENOUS

## 2020-04-13 MED ORDER — SERTRALINE HCL 100 MG PO TABS
100.0000 mg | ORAL_TABLET | Freq: Every day | ORAL | Status: DC
  Filled 2020-04-13: qty 1

## 2020-04-13 MED ORDER — MORPHINE SULFATE (PF) 4 MG/ML IV SOLN: 1.0000 mg | INTRAVENOUS | Status: DC | PRN

## 2020-04-13 MED ORDER — INSULIN ASPART 100 UNIT/ML ~~LOC~~ SOLN
0.0000 [IU] | Freq: Three times a day (TID) | SUBCUTANEOUS | Status: DC
  Administered 2020-04-13: 2 [IU] via SUBCUTANEOUS
  Administered 2020-04-14: 1 [IU] via SUBCUTANEOUS
  Filled 2020-04-13 (×2): qty 1

## 2020-04-13 MED ORDER — GABAPENTIN 100 MG PO CAPS
100.0000 mg | ORAL_CAPSULE | ORAL | Status: AC
  Administered 2020-04-13: 100 mg via ORAL

## 2020-04-13 MED ORDER — HYDROMORPHONE HCL 1 MG/ML IJ SOLN: 0.2500 mg | INTRAMUSCULAR | Status: DC | PRN

## 2020-04-13 MED ORDER — TECHNETIUM TC 99M SULFUR COLLOID FILTERED
1.0000 | Freq: Once | INTRAVENOUS | Status: AC | PRN
  Administered 2020-04-13: 1 via INTRADERMAL

## 2020-04-13 MED ORDER — CEFAZOLIN SODIUM-DEXTROSE 2-4 GM/100ML-% IV SOLN
2.0000 g | INTRAVENOUS | Status: AC
  Administered 2020-04-13: 2 g via INTRAVENOUS

## 2020-04-13 MED ORDER — LIDOCAINE HCL (CARDIAC) PF 100 MG/5ML IV SOSY
PREFILLED_SYRINGE | INTRAVENOUS | Status: DC | PRN
  Administered 2020-04-13: 60 mg via INTRAVENOUS

## 2020-04-13 MED ORDER — CHLORHEXIDINE GLUCONATE CLOTH 2 % EX PADS: 6.0000 | MEDICATED_PAD | Freq: Once | CUTANEOUS | Status: DC

## 2020-04-13 MED ORDER — ROPIVACAINE HCL 5 MG/ML IJ SOLN
INTRAMUSCULAR | Status: DC | PRN
  Administered 2020-04-13: 30 mL via PERINEURAL

## 2020-04-13 MED ORDER — FENTANYL CITRATE (PF) 100 MCG/2ML IJ SOLN
INTRAMUSCULAR | Status: DC | PRN
Start: 2020-04-13 — End: 2020-04-13
  Administered 2020-04-13: 25 ug via INTRAVENOUS

## 2020-04-13 MED ORDER — POTASSIUM CHLORIDE IN NACL 20-0.9 MEQ/L-% IV SOLN: INTRAVENOUS | Status: DC

## 2020-04-13 MED ORDER — OXYCODONE HCL 5 MG PO TABS: 5.0000 mg | ORAL_TABLET | Freq: Once | ORAL | Status: DC | PRN

## 2020-04-13 MED ORDER — TRAMADOL HCL 50 MG PO TABS: 50.0000 mg | ORAL_TABLET | Freq: Four times a day (QID) | ORAL | Status: DC | PRN

## 2020-04-13 MED ORDER — ONDANSETRON HCL 4 MG/2ML IJ SOLN
INTRAMUSCULAR | Status: AC
  Filled 2020-04-13: qty 2

## 2020-04-13 MED ORDER — ONDANSETRON HCL 4 MG/2ML IJ SOLN: 4.0000 mg | Freq: Four times a day (QID) | INTRAMUSCULAR | Status: DC | PRN

## 2020-04-13 MED ORDER — ACETAMINOPHEN 500 MG PO TABS
1000.0000 mg | ORAL_TABLET | ORAL | Status: AC
  Administered 2020-04-13: 1000 mg via ORAL

## 2020-04-13 MED ORDER — CEFAZOLIN SODIUM-DEXTROSE 2-4 GM/100ML-% IV SOLN
INTRAVENOUS | Status: AC
  Filled 2020-04-13: qty 100

## 2020-04-13 MED ORDER — PROPOFOL 10 MG/ML IV BOLUS
INTRAVENOUS | Status: AC
  Filled 2020-04-13: qty 20

## 2020-04-13 SURGICAL SUPPLY — 50 items
ADH SKN CLS APL DERMABOND .7 (GAUZE/BANDAGES/DRESSINGS) ×1
APL PRP STRL LF DISP 70% ISPRP (MISCELLANEOUS) ×1
APPLIER CLIP 9.375 MED OPEN (MISCELLANEOUS)
APR CLP MED 9.3 20 MLT OPN (MISCELLANEOUS)
BLADE CLIPPER SURG (BLADE) IMPLANT
BLADE SURG 15 STRL LF DISP TIS (BLADE) ×1 IMPLANT
BLADE SURG 15 STRL SS (BLADE) ×2
CANISTER SUCT 1200ML W/VALVE (MISCELLANEOUS) ×2 IMPLANT
CHLORAPREP W/TINT 26 (MISCELLANEOUS) ×2 IMPLANT
CLIP APPLIE 9.375 MED OPEN (MISCELLANEOUS) IMPLANT
COVER BACK TABLE 60X90IN (DRAPES) ×2 IMPLANT
COVER MAYO STAND STRL (DRAPES) ×2 IMPLANT
COVER PROBE W GEL 5X96 (DRAPES) ×2 IMPLANT
COVER WAND RF STERILE (DRAPES) IMPLANT
DECANTER SPIKE VIAL GLASS SM (MISCELLANEOUS) IMPLANT
DERMABOND ADVANCED (GAUZE/BANDAGES/DRESSINGS) ×1
DERMABOND ADVANCED .7 DNX12 (GAUZE/BANDAGES/DRESSINGS) ×1 IMPLANT
DRAIN CHANNEL 19F RND (DRAIN) ×2 IMPLANT
DRAPE LAPAROSCOPIC ABDOMINAL (DRAPES) ×2 IMPLANT
DRAPE UTILITY XL STRL (DRAPES) ×2 IMPLANT
ELECT REM PT RETURN 9FT ADLT (ELECTROSURGICAL) ×2
ELECTRODE REM PT RTRN 9FT ADLT (ELECTROSURGICAL) ×1 IMPLANT
EVACUATOR SILICONE 100CC (DRAIN) ×2 IMPLANT
GAUZE SPONGE 4X4 12PLY STRL LF (GAUZE/BANDAGES/DRESSINGS) IMPLANT
GLOVE SURG SIGNA 7.5 PF LTX (GLOVE) ×2 IMPLANT
GOWN STRL REUS W/ TWL LRG LVL3 (GOWN DISPOSABLE) ×1 IMPLANT
GOWN STRL REUS W/ TWL XL LVL3 (GOWN DISPOSABLE) ×1 IMPLANT
GOWN STRL REUS W/TWL LRG LVL3 (GOWN DISPOSABLE) ×2
GOWN STRL REUS W/TWL XL LVL3 (GOWN DISPOSABLE) ×2
HEMOSTAT SURGICEL 2X14 (HEMOSTASIS) IMPLANT
LIGHT WAVEGUIDE WIDE FLAT (MISCELLANEOUS) ×2 IMPLANT
NDL HYPO 25X1 1.5 SAFETY (NEEDLE) ×2 IMPLANT
NDL SAFETY ECLIPSE 18X1.5 (NEEDLE) ×1 IMPLANT
NEEDLE HYPO 18GX1.5 SHARP (NEEDLE) ×2
NEEDLE HYPO 25X1 1.5 SAFETY (NEEDLE) ×4 IMPLANT
NS IRRIG 1000ML POUR BTL (IV SOLUTION) ×2 IMPLANT
PACK BASIN DAY SURGERY FS (CUSTOM PROCEDURE TRAY) ×2 IMPLANT
PENCIL SMOKE EVACUATOR (MISCELLANEOUS) ×2 IMPLANT
PIN SAFETY STERILE (MISCELLANEOUS) ×2 IMPLANT
SLEEVE SCD COMPRESS KNEE MED (MISCELLANEOUS) ×2 IMPLANT
SPONGE LAP 18X18 RF (DISPOSABLE) ×3 IMPLANT
SUT ETHILON 3 0 PS 1 (SUTURE) ×2 IMPLANT
SUT MNCRL AB 4-0 PS2 18 (SUTURE) ×3 IMPLANT
SUT VIC AB 3-0 SH 27 (SUTURE) ×2
SUT VIC AB 3-0 SH 27X BRD (SUTURE) ×1 IMPLANT
SYR BULB EAR ULCER 3OZ GRN STR (SYRINGE) ×2 IMPLANT
SYR CONTROL 10ML LL (SYRINGE) ×4 IMPLANT
TOWEL GREEN STERILE FF (TOWEL DISPOSABLE) ×2 IMPLANT
TUBE CONNECTING 20X1/4 (TUBING) ×2 IMPLANT
YANKAUER SUCT BULB TIP NO VENT (SUCTIONS) ×2 IMPLANT

## 2020-04-13 NOTE — Anesthesia Postprocedure Evaluation (Signed)
Anesthesia Post Note  Patient: Judith Chavez  Procedure(s) Performed: RIGHT MASTECTOMY WITH RIGHT AXILLARY SENTINEL NODE BIOPSY (Right Breast)     Patient location during evaluation: PACU Anesthesia Type: General Level of consciousness: awake and alert Pain management: pain level controlled Vital Signs Assessment: post-procedure vital signs reviewed and stable Respiratory status: spontaneous breathing, nonlabored ventilation and respiratory function stable Cardiovascular status: blood pressure returned to baseline and stable Postop Assessment: no apparent nausea or vomiting Anesthetic complications: no   No complications documented.  Last Vitals:  Vitals:   04/13/20 1548 04/13/20 1605  BP:  (!) 152/78  Pulse: 71 76  Resp: 12 16  Temp:  36.4 C  SpO2: 99% 97%    Last Pain:  Vitals:   04/13/20 1605  TempSrc:   PainSc: 1                  Lynda Rainwater

## 2020-04-13 NOTE — Anesthesia Preprocedure Evaluation (Signed)
Anesthesia Evaluation  Patient identified by MRN, date of birth, ID band Patient awake    Reviewed: Allergy & Precautions, NPO status , Patient's Chart, lab work & pertinent test results  History of Anesthesia Complications Negative for: history of anesthetic complications  Airway Mallampati: III  TM Distance: >3 FB Neck ROM: Full    Dental no notable dental hx. (+) Dental Advisory Given   Pulmonary former smoker,    Pulmonary exam normal breath sounds clear to auscultation       Cardiovascular negative cardio ROS Normal cardiovascular exam Rhythm:Regular Rate:Normal     Neuro/Psych  Headaches, PSYCHIATRIC DISORDERS Anxiety Depression    GI/Hepatic negative GI ROS, Neg liver ROS,   Endo/Other  negative endocrine ROSdiabetes  Renal/GU negative Renal ROS  negative genitourinary   Musculoskeletal  (+) Arthritis , Pain in R leg mostly, also down L with associated numbness and weakness. Pain across lower back.    Abdominal   Peds negative pediatric ROS (+)  Hematology negative hematology ROS (+)   Anesthesia Other Findings   Reproductive/Obstetrics negative OB ROS                             Anesthesia Physical  Anesthesia Plan  ASA: III  Anesthesia Plan: General   Post-op Pain Management:  Regional for Post-op pain   Induction: Intravenous  PONV Risk Score and Plan: 3 and Ondansetron, Dexamethasone, Midazolam and Treatment may vary due to age or medical condition  Airway Management Planned: LMA  Additional Equipment:   Intra-op Plan:   Post-operative Plan: Extubation in OR  Informed Consent: I have reviewed the patients History and Physical, chart, labs and discussed the procedure including the risks, benefits and alternatives for the proposed anesthesia with the patient or authorized representative who has indicated his/her understanding and acceptance.     Dental advisory  given  Plan Discussed with: CRNA  Anesthesia Plan Comments:         Anesthesia Quick Evaluation

## 2020-04-13 NOTE — Transfer of Care (Signed)
Immediate Anesthesia Transfer of Care Note  Patient: Judith Chavez  Procedure(s) Performed: RIGHT MASTECTOMY WITH RIGHT AXILLARY SENTINEL NODE BIOPSY (Right Breast)  Patient Location: PACU  Anesthesia Type:GA combined with regional for post-op pain  Level of Consciousness: sedated  Airway & Oxygen Therapy: Patient Spontanous Breathing and Patient connected to face mask oxygen  Post-op Assessment: Report given to RN and Post -op Vital signs reviewed and stable  Post vital signs: Reviewed and stable  Last Vitals:  Vitals Value Taken Time  BP 124/80 04/13/20 1504  Temp    Pulse 72 04/13/20 1506  Resp 10 04/13/20 1506  SpO2 100 % 04/13/20 1506  Vitals shown include unvalidated device data.  Last Pain:  Vitals:   04/13/20 1505  TempSrc:   PainSc: (P) Asleep         Complications: No complications documented.

## 2020-04-13 NOTE — Interval H&P Note (Signed)
History and Physical Interval Note:no change in H and P  04/13/2020 12:03 PM  Judith Chavez  has presented today for surgery, with the diagnosis of ductal carcinoma in situ right breast.  The various methods of treatment have been discussed with the patient and family. After consideration of risks, benefits and other options for treatment, the patient has consented to  Procedure(s) with comments: RIGHT MASTECTOMY WITH AXILLARY SENTINEL NODE BIOPSY (Right) - LMA, RNFA as a surgical intervention.  The patient's history has been reviewed, patient examined, no change in status, stable for surgery.  I have reviewed the patient's chart and labs.  Questions were answered to the patient's satisfaction.     Coralie Keens

## 2020-04-13 NOTE — Op Note (Signed)
Operative Note  Judith Chavez  [id number]  [id number]  04/13/2020   Surgeon: Coralie Keens, MD   Assistant: Ahmed Prima, MD [facility]   Procedure performed:     Preop diagnosis: LCIS and DCIS of right breast Post-op diagnosis/intraop findings: As above   Specimens: right mastectomy and sentinel lymph node biopsy EBL: 20 cc Complications: none   Description of procedure: After obtaining informed consent the patient was taken to the operating room and placed supine on operating room table where general endotracheal anesthesia was initiated, preoperative antibiotics were administered, SCDs applied, and a formal timeout was performed.   The right breast and axilla was prepped and draped in the usual, sterile fashion. An elliptical incision was made transversely across the right breast including the nipple, areola complex. Skin flaps were raised with bovie electrocautery superiorly to the clavicle, medially to the sternum, laterally to the mid axillary line, and inferiorly to the inferior edge of pectoralis major until the pectoralis muscle fascia was reached.   We then turned our attention to the axilla. The gamma probe was used to identify sentinel lymph nodes with the largest reading of 248. Sentinel lymph nodes were removed en bloc with Bovie electrocautery and passed off the field. There were no additional lymph nodes identified and the basin had no radionucleotide reading present. The breast was then removed from the pectoralis muscle fascia. The breast was oriented with a long stitch lateral to the nipple.   Hemostasis was achieved then the field was irrigated with normal saline. A 19 Fr blake drain was placed in the mastectomy bed and sutured in place with a 2-0 nylon suture. The breast was then closed with multiple 3-0 vicryl interrupted sutures followed by running 4-0 biosyn and skin glue.   The patient was then awakened, extubated and taken to PACU in stable condition. All  counts were correct at the completion of the case. The attending physician was present for all portions of the case.   Ahmed Prima, MD

## 2020-04-13 NOTE — Anesthesia Preprocedure Evaluation (Deleted)
Anesthesia Evaluation  Patient identified by MRN, date of birth, ID band Patient awake    Reviewed: Allergy & Precautions, NPO status , Patient's Chart, lab work & pertinent test results  History of Anesthesia Complications Negative for: history of anesthetic complications  Airway Mallampati: III  TM Distance: >3 FB Neck ROM: Full    Dental no notable dental hx. (+) Dental Advisory Given   Pulmonary former smoker,    Pulmonary exam normal breath sounds clear to auscultation       Cardiovascular negative cardio ROS Normal cardiovascular exam Rhythm:Regular Rate:Normal     Neuro/Psych  Headaches, PSYCHIATRIC DISORDERS Anxiety Depression    GI/Hepatic negative GI ROS, Neg liver ROS,   Endo/Other  negative endocrine ROSdiabetes  Renal/GU negative Renal ROS  negative genitourinary   Musculoskeletal  (+) Arthritis , Pain in R leg mostly, also down L with associated numbness and weakness. Pain across lower back.    Abdominal   Peds negative pediatric ROS (+)  Hematology negative hematology ROS (+)   Anesthesia Other Findings   Reproductive/Obstetrics negative OB ROS                             Anesthesia Physical  Anesthesia Plan  ASA: III  Anesthesia Plan: General   Post-op Pain Management:  Regional for Post-op pain   Induction: Intravenous  PONV Risk Score and Plan: 3 and Ondansetron, Dexamethasone, Midazolam and Treatment may vary due to age or medical condition  Airway Management Planned: LMA  Additional Equipment:   Intra-op Plan:   Post-operative Plan: Extubation in OR  Informed Consent: I have reviewed the patients History and Physical, chart, labs and discussed the procedure including the risks, benefits and alternatives for the proposed anesthesia with the patient or authorized representative who has indicated his/her understanding and acceptance.     Dental advisory  given  Plan Discussed with: CRNA  Anesthesia Plan Comments:                                          Anesthesia Evaluation  Patient identified by MRN, date of birth, ID band Patient awake    Reviewed: Allergy & Precautions, NPO status , Patient's Chart, lab work & pertinent test results  History of Anesthesia Complications Negative for: history of anesthetic complications  Airway Mallampati: III  TM Distance: >3 FB Neck ROM: Full    Dental no notable dental hx. (+) Dental Advisory Given   Pulmonary former smoker,    Pulmonary exam normal breath sounds clear to auscultation       Cardiovascular negative cardio ROS Normal cardiovascular exam Rhythm:Regular Rate:Normal     Neuro/Psych  Headaches, PSYCHIATRIC DISORDERS Anxiety Depression    GI/Hepatic negative GI ROS, Neg liver ROS,   Endo/Other  negative endocrine ROS  Renal/GU negative Renal ROS  negative genitourinary   Musculoskeletal  (+) Arthritis , Pain in R leg mostly, also down L with associated numbness and weakness. Pain across lower back.    Abdominal   Peds negative pediatric ROS (+)  Hematology negative hematology ROS (+)   Anesthesia Other Findings   Reproductive/Obstetrics negative OB ROS                            Anesthesia Physical Anesthesia Plan  ASA: II  Anesthesia  Plan: General   Post-op Pain Management:    Induction: Intravenous  Airway Management Planned: Oral ETT  Additional Equipment:   Intra-op Plan:   Post-operative Plan: Extubation in OR  Informed Consent: I have reviewed the patients History and Physical, chart, labs and discussed the procedure including the risks, benefits and alternatives for the proposed anesthesia with the patient or authorized representative who has indicated his/her understanding and acceptance.   Dental advisory given  Plan Discussed with: CRNA  Anesthesia Plan Comments:          Anesthesia Quick Evaluation  Anesthesia Quick Evaluation

## 2020-04-13 NOTE — Progress Notes (Signed)
Assisted Dr. Miller with right, ultrasound guided, pectoralis block. Side rails up, monitors on throughout procedure. See vital signs in flow sheet. Tolerated Procedure well. 

## 2020-04-13 NOTE — Anesthesia Procedure Notes (Signed)
Procedure Name: LMA Insertion Date/Time: 04/13/2020 1:45 PM Performed by: Lavonia Dana, CRNA Pre-anesthesia Checklist: Patient identified, Emergency Drugs available, Suction available and Patient being monitored Patient Re-evaluated:Patient Re-evaluated prior to induction Oxygen Delivery Method: Circle system utilized Preoxygenation: Pre-oxygenation with 100% oxygen Induction Type: IV induction Ventilation: Mask ventilation without difficulty LMA: LMA inserted LMA Size: 4.0 Number of attempts: 1 Airway Equipment and Method: Bite block Placement Confirmation: positive ETCO2 Tube secured with: Tape Dental Injury: Teeth and Oropharynx as per pre-operative assessment

## 2020-04-13 NOTE — Anesthesia Procedure Notes (Signed)
Anesthesia Regional Block: Pectoralis block   Pre-Anesthetic Checklist: ,, timeout performed, Correct Patient, Correct Site, Correct Laterality, Correct Procedure, Correct Position, site marked, Risks and benefits discussed,  Surgical consent,  Pre-op evaluation,  At surgeon's request and post-op pain management  Laterality: Right  Prep: chloraprep       Needles:  Injection technique: Single-shot  Needle Type: Stimiplex     Needle Length: 9cm  Needle Gauge: 21     Additional Needles:   Procedures:,,,, ultrasound used (permanent image in chart),,,,  Narrative:  Start time: 04/13/2020 12:54 PM End time: 04/13/2020 12:59 PM Injection made incrementally with aspirations every 5 mL.  Performed by: Personally  Anesthesiologist: Lynda Rainwater, MD

## 2020-04-14 ENCOUNTER — Encounter (HOSPITAL_BASED_OUTPATIENT_CLINIC_OR_DEPARTMENT_OTHER): Payer: Self-pay | Admitting: Surgery

## 2020-04-14 DIAGNOSIS — D0501 Lobular carcinoma in situ of right breast: Secondary | ICD-10-CM | POA: Diagnosis not present

## 2020-04-14 DIAGNOSIS — D0511 Intraductal carcinoma in situ of right breast: Secondary | ICD-10-CM | POA: Diagnosis not present

## 2020-04-14 DIAGNOSIS — Z87891 Personal history of nicotine dependence: Secondary | ICD-10-CM | POA: Diagnosis not present

## 2020-04-14 DIAGNOSIS — E119 Type 2 diabetes mellitus without complications: Secondary | ICD-10-CM | POA: Diagnosis not present

## 2020-04-14 LAB — GLUCOSE, CAPILLARY
Glucose-Capillary: 178 mg/dL — ABNORMAL HIGH (ref 70–99)
Glucose-Capillary: 128 mg/dL — ABNORMAL HIGH (ref 70–99)

## 2020-04-14 MED ORDER — TRAMADOL HCL 50 MG PO TABS: 50.0000 mg | ORAL_TABLET | Freq: Four times a day (QID) | ORAL | 1 refills | Status: DC | PRN

## 2020-04-14 NOTE — Discharge Summary (Signed)
Physician Discharge Summary  Patient ID: Judith Chavez MRN: [id number] DOB/AGE: 1946/04/20 74 y.o.  Admit date: 04/13/2020 Discharge date: 04/14/2020  Admission Diagnoses:  Discharge Diagnoses:  Active Problems:   History of right breast cancer   Discharged Condition: good  Hospital Course: uneventful post op recovery.  Discharged home POD#1  Consults: None  Significant Diagnostic Studies:   Treatments: surgery: right mastectomy with sentinel node biopsy  Discharge Exam: Blood pressure (!) 110/99, pulse 69, temperature 98.2 F (36.8 C), resp. rate 18, height 5\' 2"  (1.575 m), weight 71.4 kg, SpO2 100 %. General appearance: alert, cooperative and no distress Resp: clear to auscultation bilaterally Cardio: regular rate and rhythm, S1, S2 normal, no murmur, click, rub or gallop Incision/Wound:incision clean, flaps viable, drain serosang  Disposition: Discharge disposition: 01-Home or Self Care       Discharge Instructions    Discharge patient   Complete by: As directed    Discharge disposition: 01-Home or Self Care   Discharge patient date: 04/14/2020     Allergies as of 04/14/2020   No Known Allergies     Medication List    TAKE these medications   ALPRAZolam 0.5 MG tablet Commonly known as: XANAX Take 0.5 mg by mouth 2 (two) times daily as needed.   calcium-vitamin D 250-100 MG-UNIT tablet Take 1 tablet by mouth daily.   Januvia 100 MG tablet Generic drug: sitaGLIPtin Take 100 mg by mouth daily.   NASACORT ALLERGY 24HR NA Place 1 spray into both nostrils daily as needed (for allergies).   NovoFine 32G X 6 MM Misc Generic drug: Insulin Pen Needle AS DIRECTED ONCE A DAY WITH TRESIBA FLEXTOUCH   OneTouch Delica Lancets 02H Misc 2 (two) times daily. for testing   OneTouch Verio test strip Generic drug: glucose blood USE AS DIRECTED TO TEST BLOOD SUGAR TWICE A DAY   sertraline 100 MG tablet Commonly known as: ZOLOFT Take 100 mg by mouth daily.    traMADol 50 MG tablet Commonly known as: ULTRAM Take 1 tablet (50 mg total) by mouth every 6 (six) hours as needed for moderate pain or severe pain.   Tyler Aas FlexTouch 100 UNIT/ML FlexTouch Pen Generic drug: insulin degludec 30 Units.   VITAMIN D-3 PO Take 1 tablet by mouth daily.       Follow-up Information    Coralie Keens, MD. Call on 04/28/2020.   Specialty: General Surgery Contact information: [place] [address] [phone]               Signed: Coralie Keens 04/14/2020, 8:15 AM

## 2020-04-14 NOTE — Discharge Instructions (Signed)
[facility] °[phone] ° °MASTECTOMY: POST OP INSTRUCTIONS ° °Always review your discharge instruction sheet given to you by the facility where your surgery was performed. °IF YOU HAVE DISABILITY OR FAMILY LEAVE FORMS, YOU MUST BRING THEM TO THE OFFICE FOR PROCESSING.   °DO NOT GIVE THEM TO YOUR DOCTOR. °A prescription for pain medication may be given to you upon discharge.  Take your pain medication as prescribed, if needed.  If narcotic pain medicine is not needed, then you may take acetaminophen (Tylenol) or ibuprofen (Advil) as needed. °1. Take your usually prescribed medications unless otherwise directed. °2. If you need a refill on your pain medication, please contact your pharmacy.  They will contact our office to request authorization.  Prescriptions will not be filled after 5pm or on week-ends. °3. You should follow a light diet the first few days after arrival home, such as soup and crackers, etc.  Resume your normal diet the day after surgery. °4. Most patients will experience some swelling and bruising on the chest and underarm.  Ice packs will help.  Swelling and bruising can take several days to resolve.  °5. It is common to experience some constipation if taking pain medication after surgery.  Increasing fluid intake and taking a stool softener (such as Colace) will usually help or prevent this problem from occurring.  A mild laxative (Milk of Magnesia or Miralax) should be taken according to package instructions if there are no bowel movements after 48 hours. °6. Unless discharge instructions indicate otherwise, leave your bandage dry and in place until your next appointment in 3-5 days.  You may take a limited sponge bath.  No tube baths or showers until the drains are removed.  You may have steri-strips (small skin tapes) in place directly over the incision.  These strips should be left on the skin for 7-10 days.  If your surgeon used skin glue on the incision, you may  shower in 24 hours.  The glue will flake off over the next 2-3 weeks.  Any sutures or staples will be removed at the office during your follow-up visit. °7. DRAINS:  If you have drains in place, it is important to keep a list of the amount of drainage produced each day in your drains.  Before leaving the hospital, you should be instructed on drain care.  Call our office if you have any questions about your drains. °8. ACTIVITIES:  You may resume regular (light) daily activities beginning the next day--such as daily self-care, walking, climbing stairs--gradually increasing activities as tolerated.  You may have sexual intercourse when it is comfortable.  Refrain from any heavy lifting or straining until approved by your doctor. °a. You may drive when you are no longer taking prescription pain medication, you can comfortably wear a seatbelt, and you can safely maneuver your car and apply brakes. °b. RETURN TO WORK:  __________________________________________________________ °9. You should see your doctor in the office for a follow-up appointment approximately 3-5 days after your surgery.  Your doctor’s nurse will typically make your follow-up appointment when she calls you with your pathology report.  Expect your pathology report 2-3 business days after your surgery.  You may call to check if you do not hear from us after three days.   °10. OTHER INSTRUCTIONS: ______________________________________________________________________________________________ ____________________________________________________________________________________________ ° °WHEN TO CALL YOUR DOCTOR: °1. Fever over 101.0 °2. Nausea and/or vomiting °3. Extreme swelling or bruising °4. Continued bleeding from incision. °5. Increased pain, redness, or drainage from the   incision. ° °The clinic staff is available to answer your questions during regular business hours.  Please don’t hesitate to call and ask to speak to one of the nurses for clinical  concerns.  If you have a medical emergency, go to the nearest emergency room or call 911.  A surgeon from [facility] is always on call at the hospital. °[address], [address], [place], [place]  [address] ? [address] [address], [place], [place]   [address] °[phone] ? [phone] ? FAX [phone] °Web site: [facility] ° °About my Jackson-Pratt Bulb Drain ° °What is a Jackson-Pratt bulb? °A Jackson-Pratt is a soft, round device used to collect drainage. It is connected to a long, thin drainage catheter, which is held in place by one or two small stiches near your surgical incision site. When the bulb is squeezed, it forms a vacuum, forcing the drainage to empty into the bulb. ° °Emptying the Jackson-Pratt bulb- °To empty the bulb: °1. Release the plug on the top of the bulb. °2. Pour the bulb's contents into a measuring container which your nurse will provide. °3. Record the time emptied and amount of drainage. Empty the drain(s) as often as your     doctor or nurse recommends. ° °Date                  Time                    Amount (Drain 1)                 Amount (Drain 2) ° °_____________________________________________________________________ ° °_____________________________________________________________________ ° °_____________________________________________________________________ ° °_____________________________________________________________________ ° °_____________________________________________________________________ ° °_____________________________________________________________________ ° °_____________________________________________________________________ ° °_____________________________________________________________________ ° °Squeezing the Jackson-Pratt Bulb- °To squeeze the bulb: °1. Make sure the plug at the top of the bulb is open. °2. Squeeze the bulb tightly in your fist. You will hear air squeezing from the bulb. °3. Replace the plug while the bulb is squeezed. °4.  Use a safety pin to attach the bulb to your clothing. This will keep the catheter from     pulling at the bulb insertion site. ° °When to call your doctor- °Call your doctor if: °· Drain site becomes red, swollen or hot. °· You have a fever greater than 101 degrees F. °· There is oozing at the drain site. °· Drain falls out (apply a guaze bandage over the drain hole and secure it with tape). °· Drainage increases daily not related to activity patterns. (You will usually have more drainage when you are active than when you are resting.) °· Drainage has a bad odor. ° °

## 2020-04-15 LAB — SURGICAL PATHOLOGY

## 2020-04-16 ENCOUNTER — Encounter: Payer: Self-pay | Admitting: *Deleted

## 2020-04-28 ENCOUNTER — Encounter: Payer: Medicare Other | Admitting: Rehabilitation

## 2020-05-01 ENCOUNTER — Other Ambulatory Visit: Payer: Self-pay

## 2020-05-01 ENCOUNTER — Encounter: Payer: Self-pay | Admitting: Rehabilitation

## 2020-05-01 ENCOUNTER — Ambulatory Visit: Payer: Medicare Other | Attending: Surgery | Admitting: Rehabilitation

## 2020-05-01 DIAGNOSIS — Z483 Aftercare following surgery for neoplasm: Secondary | ICD-10-CM | POA: Insufficient documentation

## 2020-05-01 DIAGNOSIS — M25621 Stiffness of right elbow, not elsewhere classified: Secondary | ICD-10-CM | POA: Insufficient documentation

## 2020-05-01 DIAGNOSIS — R293 Abnormal posture: Secondary | ICD-10-CM | POA: Diagnosis not present

## 2020-05-01 DIAGNOSIS — R600 Localized edema: Secondary | ICD-10-CM | POA: Diagnosis not present

## 2020-05-01 NOTE — Therapy (Signed)
[place], [place], [address] Phone: [phone]   Fax:  [phone]  Physical Therapy Treatment  Patient Details  Name: Judith Chavez MRN: [id number] Date of Birth: [birth year] Referring Provider (PT): Dr. Ninfa Linden   Encounter Date: 05/01/2020   PT End of Session - 05/01/20 1131    Visit Number 2    Number of Visits 10    Date for PT Re-Evaluation 06/12/20    PT Start Time 1100    PT Stop Time 1130    PT Time Calculation (min) 30 min    Activity Tolerance Patient tolerated treatment well    Behavior During Therapy [facility] for tasks assessed/performed           Past Medical History:  Diagnosis Date  . Anxiety   . Arthritis    knees  . Cancer ([place]) 03/2020   right breast DCIS, LCIS  . Depression   . Diabetes mellitus without complication (HCC)    Type 2  . Family history of breast cancer 02/28/2020  . Headache    migraines  . History of pneumonia   . Numbness    "left foot"   . Poor circulation     Past Surgical History:  Procedure Laterality Date  . ABCESS DRAINAGE     "after cesarean- in hospital for 3 months"  . ABDOMINAL HYSTERECTOMY    . CESAREAN SECTION  1970  . CHOLECYSTECTOMY    . COLONOSCOPY    . EYE SURGERY Right   . MENISCUS REPAIR Bilateral   . SIMPLE MASTECTOMY WITH AXILLARY SENTINEL NODE BIOPSY Right 04/13/2020   Procedure: RIGHT MASTECTOMY WITH RIGHT AXILLARY SENTINEL NODE BIOPSY;  Surgeon: Coralie Keens, MD;  Location: [facility];  Service: General;  Laterality: Right;  . SPINE SURGERY      There were no vitals filed for this visit.   Subjective Assessment - 05/01/20 1057    Subjective I got my drain out on Monday and then I needed to go back in and remove some fluid on Tuesday.  I see him again on the 4th.    Pertinent History Rt breast cancer DCIS, LCIS, ER/PR positive s/p mastectomy with 0/3 nodes removed on 04/13/20 Dr. Ninfa Linden and radiation  status unknown.  . Other history includes anxiety, OA, hysterectomy, DM    Currently in Pain? Yes    Pain Score 4     Pain Location Chest    Pain Orientation Right    Pain Descriptors / Indicators Aching;Sore    Pain Type Surgical pain    Pain Onset 1 to 4 weeks ago    Pain Frequency Constant              [facility] PT Assessment - 05/01/20 0001      Observation/Other Assessments   Observations well healed Rt lumpectomy incision with evident seroma inferior of the incision with sloshing along the length with quick pressure and release      Posture/Postural Control   Postural Limitations Rounded Shoulders;Forward head      AROM   Right Shoulder Flexion 110 Degrees    Right Shoulder ABduction 142 Degrees    Right Shoulder Internal Rotation 70 Degrees    Right Shoulder External Rotation 80 Degrees             LYMPHEDEMA/ONCOLOGY QUESTIONNAIRE - 05/01/20 0001      Treatment   Active Chemotherapy Treatment No    Past Chemotherapy Treatment No    Active  Radiation Treatment No    Past Radiation Treatment No    Current Hormone Treatment No    Past Hormone Therapy No                      [facility] Adult PT Treatment/Exercise - 05/01/20 0001      Exercises   Exercises Other Exercises    Other Exercises  pt had not yet started stretching so she was advised to start these and each were reviewed       Manual Therapy   Manual Therapy Edema management    Edema Management pt was wearing binder under the incision so was instructed to raise this to up over the seroma and watch for incision irritation.  Also educated on the importance of pressure against the seroma to decrease filling with garment, pillow, and given 1/2" gray foam rectangle                        PT Long Term Goals - 05/01/20 1136      PT LONG TERM GOAL #1   Title Pt will be given risk reduction paper and education on lymphedema    Time 6    Period Weeks    Status New      PT LONG TERM  GOAL #2   Title pt will return Rt shoulder to baseline measurements    Baseline Flex: 165,   Abd: 175    Time 6    Period Weeks    Status New                 Plan - 05/01/20 1132    Clinical Impression Statement Pt presents post Rt mastectomy and SLNB with decreased AROM, seroma of the Rt chest wall, and chest and back pain.  Pt will be seen again in about 1 week after starting post op stretches and using pressure against the seroma/draining again as needed.  Pt will benefit from continued visits to make sure AROM is maximized and chest wall edema is minimized.    Personal Factors and Comorbidities Age    Examination-Activity Limitations Reach Overhead;Sleep    Examination-Participation Restrictions Cleaning;Meal Prep;Yard Work;Community Activity;Driving;Shop;Laundry    Stability/Clinical Decision Making Stable/Uncomplicated    Clinical Decision Making Low    Rehab Potential Excellent    PT Frequency --   1-2x per week   PT Duration 6 weeks    PT Treatment/Interventions Therapeutic exercise;Patient/family education;Manual techniques;Manual lymph drainage;Taping;ADLs/Self Care Home Management    PT Next Visit Plan recheck seroma, AROM, continue with PROM AAROM in clinic and advance HEP post op stretches, schedule SOZO followup, MLD chest wall as needed, give risk reduction and measure arm    Consulted and Agree with Plan of Care Patient           Patient will benefit from skilled therapeutic intervention in order to improve the following deficits and impairments:  Decreased skin integrity, Pain, Postural dysfunction, Decreased range of motion, Decreased activity tolerance, Decreased knowledge of precautions  Visit Diagnosis: Abnormal posture  Aftercare following surgery for neoplasm  Stiffness of right upper arm joint  Localized edema     Problem List Patient Active Problem List   Diagnosis Date Noted  . History of right breast cancer 04/13/2020  . Genetic testing  03/10/2020  . Family history of breast cancer 02/28/2020  . Malignant neoplasm of upper-outer quadrant of right breast in female, estrogen receptor positive ([place]) 02/26/2020  .  Primary osteoarthritis of right knee 03/20/2019  . Primary osteoarthritis of left knee 03/20/2019  . Primary osteoarthritis of both knees 04/24/2017  . Spondylolisthesis at L3-L4 level 06/01/2015    [name] 05/01/2020, 11:38 AM  [place], [place], [address] Phone: [phone]   Fax:  [phone]  Name: Judith Chavez MRN: [id number] Date of Birth: 08/24/1945

## 2020-05-01 NOTE — Progress Notes (Signed)
Judith Chavez   [phone] [phone]   Clinic Follow up Note   Patient Care Team: Mayra Neer, MD as PCP - General (Family Medicine) Coralie Keens, MD as Consulting Physician (General Surgery) Truitt Merle, MD as Consulting Physician (Hematology) Gery Pray, MD as Consulting Physician (Radiation Oncology) Mauro Kaufmann, RN as Oncology Nurse Navigator Rockwell Germany, RN as Oncology Nurse Navigator  Date of Service:  05/04/2020  CHIEF COMPLAINT: F/u of right breast LCIS  SUMMARY OF ONCOLOGIC HISTORY: Oncology History Overview Note  Cancer Staging Malignant neoplasm of upper-outer quadrant of right breast in female, estrogen receptor positive ([place]) Staging form: Breast, AJCC 8th Edition - Clinical stage from 02/17/2020: Stage 0 (cTis (DCIS), cN0, cM0, ER+, PR+) - Signed by Gardenia Phlegm, NP on 02/26/2020    Malignant neoplasm of upper-outer quadrant of right breast in female, estrogen receptor positive ([place])  02/13/2020 Mammogram   IMPRESSION: Highly suspicious calcifications within the upper-outer quadrant of the RIGHT breast, measuring 2.8 cm extent, for which stereotactic biopsy is recommended.   02/17/2020 Cancer Staging   Staging form: Breast, AJCC 8th Edition - Clinical stage from 02/17/2020: Stage 0 (cTis (DCIS), cN0, cM0, ER+, PR+) - Signed by Gardenia Phlegm, NP on 02/26/2020   02/17/2020 Initial Biopsy   Diagnosis Breast, right, needle core biopsy, lateral - DUCTAL CARCINOMA IN SITU, HIGH-GRADE WITH NECROSIS AND CALCIFICATIONS. SEE NOTE - LOBULAR CARCINOMA IN SITU Diagnosis Note DCIS measures 0.5 cm in greatest linear dimension. Dr. Saralyn Pilar reviewed the case and concurs with the diagnosis. A breast prognostic profile (ER, PR) is pending and will be reported in an addendum. [facility] was notified on 02/18/2020.   02/17/2020 Receptors her2   PROGNOSTIC  INDICATORS Results: IMMUNOHISTOCHEMICAL AND MORPHOMETRIC ANALYSIS PERFORMED MANUALLY Estrogen Receptor: 95%, POSITIVE, MODERATE STAINING INTENSITY Progesterone Receptor: 70%, POSITIVE, MODERATE STAINING INTENSITY   02/26/2020 Initial Diagnosis   Malignant neoplasm of upper-outer quadrant of right breast in female, estrogen receptor positive ([place])   03/08/2020 Genetic Testing   No pathogenic variants detected in [facility] Common Hereditary Cancers Panel. The Common Hereditary Cancers Panel offered by [facility] includes sequencing and/or deletion duplication testing of the following 48 genes: APC, ATM, AXIN2, BARD1, BMPR1A, BRCA1, BRCA2, BRIP1, CDH1, CDK4, CDKN2A (p14ARF), CDKN2A (p16INK4a), CHEK2, CTNNA1, DICER1, EPCAM (Deletion/duplication testing only), GREM1 (promoter region deletion/duplication testing only), KIT, MEN1, MLH1, MSH2, MSH3, MSH6, MUTYH, NBN, NF1, NHTL1, PALB2, PDGFRA, PMS2, POLD1, POLE, PTEN, RAD50, RAD51C, RAD51D, RNF43, SDHB, SDHC, SDHD, SMAD4, SMARCA4. STK11, TP53, TSC1, TSC2, and VHL.  The following genes were evaluated for sequence changes only: SDHA and HOXB13 c.251G>A variant only. The report date is March 08, 2020.   03/08/2020 Breast MRI   IMPRESSION: 1. Suspicious, linear enhancement involving the upper-outer quadrant of the right breast from mid to posterior depth. Susceptibility artifact from post-biopsy clip is seen along the posterior, inferior margin. The overall suspicious finding span approximately 4.2 x 2.8 cm in the AP by transverse dimensions. Recommendation is for MRI guided biopsy along the anterior margin of this enhancement, particularly at the irregular focus identified on series 10, image 81/144. 2. No suspicious MRI findings in the remainder of the right breast. 3. No MRI evidence of malignancy on the left. 4. No suspicious lymphadenopathy.   03/20/2020 Pathology Results   Diagnosis Breast, right, needle core biopsy, outer - LOBULAR CARCINOMA IN  SITU. Microscopic Comment Immunohistochemistry for E-cadherin is negative. P63, Calponin and SMM-1 demonstrate the presence of myoepithelium. CKAE1AE3  is negative for an occult process. Results reported to [facility] on 03/23/2020. Dr. Melina Copa reviewed.   04/13/2020 Surgery   RIGHT MASTECTOMY WITH RIGHT AXILLARY SENTINEL NODE BIOPSY by Dr Ninfa Linden   04/13/2020 Pathology Results   FINAL MICROSCOPIC DIAGNOSIS:   A. BREAST, RIGHT, MASTECTOMY:  - Ductal carcinoma in situ (DCIS), high-grade with focal necrosis and  calcifications, spanning an area of 4.2 cm  - Lobular carcinoma in situ  - Resection margins are negative for DCIS  - Biopsy site changes  - See oncology table   B. LYMPH NODE, RIGHT AXILLARY, SENTINEL, EXCISION:  - Lymph node, negative for carcinoma (0/1)   C. LYMPH NODE, RIGHT AXILLARY, SENTINEL, EXCISION:  - Lymph node, negative for carcinoma (0/1)   D. LYMPH NODE, RIGHT AXILLARY, SENTINEL, EXCISION:  - Lymph node, negative for carcinoma (0/1)    07/2020 -  Anti-estrogen oral therapy   Tamoxifen 3m once daily starting by 07/2020      CURRENT THERAPY:  Tamoxifen 242monce daily starting by 07/2020  INTERVAL HISTORY:  GlPansey Chavez here for a follow up. She presents to the clinic with her husband She notes she is doing well. She notes her mastectomy went well. She had her drain tubes removed 2 weeks ago but notes recent fluid collection which is movable. She also notes nerve pain at right chest wall which feels like her nipple is still present. She notes some baseline arthritis and has recent right knee baker's cyst. She notes since her surgery she has started having hot flashes recently. She notes when she had hysterectomy at age [age]he did not notice much of her menopause in her 89sShe notes she is not on Zoloft as needed for 2 years. She uses this for her mood. She sees her surgeon next week.    REVIEW OF SYSTEMS:   Constitutional: Denies  fevers, chills or abnormal weight loss (+) hot flashes Eyes: Denies blurriness of vision Ears, nose, mouth, throat, and face: Denies mucositis or sore throat Respiratory: Denies cough, dyspnea or wheezes Cardiovascular: Denies palpitation, chest discomfort or lower extremity swelling Gastrointestinal:  Denies nausea, heartburn or change in bowel habits Skin: Denies abnormal skin rashes Lymphatics: Denies new lymphadenopathy or easy bruising Neurological:Denies numbness, tingling or new weaknesses Behavioral/Psych: Mood is stable, no new changes  All other systems were reviewed with the patient and are negative.  MEDICAL HISTORY:  Past Medical History:  Diagnosis Date  . Anxiety   . Arthritis    knees  . [place]   right breast DCIS, LCIS  . Depression   . Diabetes mellitus without complication (HCC)    Type 2  . Family history of breast cancer 02/28/2020  . Headache    migraines  . History of pneumonia   . Numbness    "left foot"   . Poor circulation     SURGICAL HISTORY: Past Surgical History:  Procedure Laterality Date  . ABCESS DRAINAGE     "after cesarean- in hospital for 3 months"  . ABDOMINAL HYSTERECTOMY    . CESAREAN SECTION  1970  . CHOLECYSTECTOMY    . COLONOSCOPY    . EYE SURGERY Right   . MENISCUS REPAIR Bilateral   . SIMPLE MASTECTOMY WITH AXILLARY SENTINEL NODE BIOPSY Right 04/13/2020   Procedure: RIGHT MASTECTOMY WITH RIGHT AXILLARY SENTINEL NODE BIOPSY;  Surgeon: BlCoralie KeensMD;  [facility] Service: General;  Laterality: Right;  . SPINE SURGERY  I have reviewed the social history and family history with the patient and they are unchanged from previous note.  ALLERGIES:  has No Known Allergies.  MEDICATIONS:  Current Outpatient Medications  Medication Sig Dispense Refill  . ALPRAZolam (XANAX) 0.5 MG tablet Take 0.5 mg by mouth 2 (two) times daily as needed.    . calcium-vitamin D 250-100 MG-UNIT  tablet Take 1 tablet by mouth daily.    . Cholecalciferol (VITAMIN D-3 PO) Take 1 tablet by mouth daily.    Marland Kitchen NOVOFINE 32G X 6 MM MISC AS DIRECTED ONCE A DAY WITH TRESIBA FLEXTOUCH    . ONETOUCH DELICA LANCETS 41D MISC 2 (two) times daily. for testing  2  . ONETOUCH VERIO test strip USE AS DIRECTED TO TEST BLOOD SUGAR TWICE A DAY  3  . sitaGLIPtin (JANUVIA) 100 MG tablet Take 100 mg by mouth daily.    . tamoxifen (NOLVADEX) 20 MG tablet Take 1 tablet (20 mg total) by mouth daily. 30 tablet 3  . traMADol (ULTRAM) 50 MG tablet Take 1 tablet (50 mg total) by mouth every 6 (six) hours as needed for moderate pain or severe pain. 25 tablet 1  . TRESIBA FLEXTOUCH 100 UNIT/ML SOPN FlexTouch Pen 30 Units.     . Triamcinolone Acetonide (NASACORT ALLERGY 24HR NA) Place 1 spray into both nostrils daily as needed (for allergies).    . venlafaxine XR (EFFEXOR-XR) 75 MG 24 hr capsule Take 1 capsule (75 mg total) by mouth daily with breakfast. 30 capsule 2   No current facility-administered medications for this visit.    PHYSICAL EXAMINATION: ECOG PERFORMANCE STATUS: 1 - Symptomatic but completely ambulatory  Vitals:   05/04/20 1317  BP: (!) 164/91  Pulse: 78  Resp: 18  Temp: 98.6 F (37 C)  SpO2: 100%   Filed Weights   05/04/20 1317  Weight: 154 lb 1.6 oz (69.9 kg)    GENERAL:alert, no distress and comfortable SKIN: skin color, texture, turgor are normal, no rashes or significant lesions EYES: normal, Conjunctiva are pink and non-injected, sclera clear  NECK: supple, thyroid normal size, non-tender, without nodularity LYMPH:  no palpable lymphadenopathy in the cervical, axillary  LUNGS: clear to auscultation and percussion with normal breathing effort HEART: regular rate & rhythm and no murmurs and no lower extremity edema ABDOMEN:abdomen soft, non-tender and normal bowel sounds Musculoskeletal:no cyanosis of digits and no clubbing  NEURO: alert & oriented x 3 with fluent speech, no focal  motor/sensory deficits BREAST: s/p right mastectomy with breast surgically absent: Surgical incision healed well with residual glue.  Exam of left breast and axilla showed no palpable nodule or mass   LABORATORY DATA:  I have reviewed the data as listed CBC Latest Ref Rng & Units 02/28/2020 01/13/2017 01/13/2017  WBC 4.0 - 10.5 K/uL 6.6 - 6.8  Hemoglobin 12.0 - 15.0 g/dL 13.8 14.3 12.8  Hematocrit 36 - 46 % 44.7 42.0 41.8  Platelets 150 - 400 K/uL 209 - 222     CMP Latest Ref Rng & Units 04/09/2020 02/28/2020 01/13/2017  Glucose 70 - 99 mg/dL 74 75 114(H)  BUN 8 - 23 mg/dL _0 Creatinine 0.44 - 1.00 mg/dL 0.61 0.73 0.40(L)  Sodium 135 - 145 mmol/L 140 143 141  Potassium 3.5 - 5.1 mmol/L 4.5 4.5 4.4  Chloride 98 - 111 mmol/L 107 107 106  CO2 22 - 32 mmol/L 22 26 -  Calcium 8.9 - 10.3 mg/dL 9.0 9.9 -  Total Protein 6.5 -  8.1 g/dL - 7.7 -  Total Bilirubin 0.3 - 1.2 mg/dL - 0.4 -  Alkaline Phos 38 - 126 U/L - 77 -  AST 15 - 41 U/L - 18 -  ALT 0 - 44 U/L - 13 -      RADIOGRAPHIC STUDIES: I have personally reviewed the radiological images as listed and agreed with the findings in the report. No results found.   ASSESSMENT & PLAN:  Judith Chavez is a 74 y.o. female with    1. Right breast DCIS, High Grade, ER /PR+, (+) LCIS -She was diagnosed in 02/2020 with 2.8cm mass in the right breast with DCIS and components of necrosis and LCIS. There was no invasive cancer on either biopsy.  -She underwent right mastectomy by Dr Ninfa Linden on 04/13/20. I discussed her pathology report shows her right breast 4.2cm DCIS and components of LCIS was completley removed, negative margins and LN negative.  -Her DCIS and LCIS was cured by complete surgical resection. Any form of adjuvant therapy is preventive given her high risk of future breast cancer. I discussed with DCIS and LCIS she has increased risk of future breast cancer, mainly in her left breast.  -Given she is s/p mastectomy, adjuvant radiation  is not recommended.  -Given her strongly positive ER and PR and arthritis, history of hysterectomy, I recommend antiestrogen therapy with Tamoxifen for 5 years, which decrease her risk of future breast cancer by ~40-50%.              --The potential side effects, which includes but not limited to, hot flash, skin and vaginal dryness, slightly increased risk of cardiovascular disease and cataract, small risk of thrombosis, were discussed with her in great details. She will need to watch her weight, blood sugar and cholesterol on Antiestrogen therapy.  Tamoxifen also slightly increased risk of thrombosis.  No risk of endometrial cancer from tamoxifen since she had hysterectomy.  I encouraged her to remain active. She is interested in trying medication, I gave her print out of medication. Plan to start by 07/2020. -We also discussed the breast cancer surveillance after her surgery. She will continue annual screening mammogram, self exams, and a routine office visit with lab and exam with [place]. I discussed the option of additional screening with annual breast MRIs.  -Will proceed with Survivorship clinic with NP Lacie in 3 months. F/u with me in 6 months.    2. Genetic Testing was negative for pathogenetic mutations except SDHA and HOXB13 c.251G>A variant only   3. Anxiety/Depression  -She has experienced depression and anxiety. She is on Zoloft.  -Given Tamoxifen's drug interaction with Zoloft, I recommend switching to Effexor. She is agreeable (05/04/20). I will copy note to her PCP.  -Her anxiety has impacted her sleep. I recommend OTC melatonin or Benadryl to help  4. Comorbidities: DM, Arthritis  -She has arthritis in her knees, shoulders and back. She has had knee and back surgery. She is able to remain active with routine activities but will have some pain.   -Continue to F/u of PCP   PLAN:  -I called in Tamoxifen today, she will start in a few months after the holidays  -I called in  Effexor to replace Zoloft due to drug interaction with Tamoxifen  -Survivorship clinic with NP Lacie in 3 months  -Lab and F/u in 6 with me in 6 months    No problem-specific Assessment & Plan notes found for this encounter.   No orders of the defined  types were placed in this encounter.  All questions were answered. The patient knows to call the clinic with any problems, questions or concerns. No barriers to learning was detected. The total time spent in the appointment was 30 minutes.     Truitt Merle, MD 05/04/2020   I, Joslyn Devon, am acting as scribe for Truitt Merle, MD.   I have reviewed the above documentation for accuracy and completeness, and I agree with the above.

## 2020-05-04 ENCOUNTER — Inpatient Hospital Stay: Payer: Medicare Other | Attending: Hematology | Admitting: Hematology

## 2020-05-04 ENCOUNTER — Encounter: Payer: Self-pay | Admitting: Hematology

## 2020-05-04 ENCOUNTER — Other Ambulatory Visit: Payer: Self-pay

## 2020-05-04 VITALS — BP 164/91 | HR 78 | Temp 98.6°F | Resp 18 | Ht 62.0 in | Wt 154.1 lb

## 2020-05-04 DIAGNOSIS — M1909 Primary osteoarthritis, other specified site: Secondary | ICD-10-CM | POA: Insufficient documentation

## 2020-05-04 DIAGNOSIS — D0511 Intraductal carcinoma in situ of right breast: Secondary | ICD-10-CM | POA: Diagnosis not present

## 2020-05-04 DIAGNOSIS — Z803 Family history of malignant neoplasm of breast: Secondary | ICD-10-CM | POA: Diagnosis not present

## 2020-05-04 DIAGNOSIS — M19019 Primary osteoarthritis, unspecified shoulder: Secondary | ICD-10-CM | POA: Insufficient documentation

## 2020-05-04 DIAGNOSIS — Z9011 Acquired absence of right breast and nipple: Secondary | ICD-10-CM | POA: Insufficient documentation

## 2020-05-04 DIAGNOSIS — F419 Anxiety disorder, unspecified: Secondary | ICD-10-CM | POA: Diagnosis not present

## 2020-05-04 DIAGNOSIS — Z8619 Personal history of other infectious and parasitic diseases: Secondary | ICD-10-CM | POA: Diagnosis not present

## 2020-05-04 DIAGNOSIS — C50411 Malignant neoplasm of upper-outer quadrant of right female breast: Secondary | ICD-10-CM | POA: Diagnosis not present

## 2020-05-04 DIAGNOSIS — E119 Type 2 diabetes mellitus without complications: Secondary | ICD-10-CM | POA: Diagnosis not present

## 2020-05-04 DIAGNOSIS — M170 Bilateral primary osteoarthritis of knee: Secondary | ICD-10-CM | POA: Diagnosis not present

## 2020-05-04 DIAGNOSIS — F329 Major depressive disorder, single episode, unspecified: Secondary | ICD-10-CM | POA: Diagnosis not present

## 2020-05-04 DIAGNOSIS — D0501 Lobular carcinoma in situ of right breast: Secondary | ICD-10-CM | POA: Diagnosis not present

## 2020-05-04 DIAGNOSIS — Z170 Estrogen receptor positive status [ER+]: Secondary | ICD-10-CM

## 2020-05-04 DIAGNOSIS — Z90710 Acquired absence of both cervix and uterus: Secondary | ICD-10-CM | POA: Diagnosis not present

## 2020-05-04 DIAGNOSIS — Z79810 Long term (current) use of selective estrogen receptor modulators (SERMs): Secondary | ICD-10-CM | POA: Diagnosis not present

## 2020-05-04 DIAGNOSIS — Z79899 Other long term (current) drug therapy: Secondary | ICD-10-CM | POA: Insufficient documentation

## 2020-05-04 MED ORDER — TAMOXIFEN CITRATE 20 MG PO TABS: 20.0000 mg | ORAL_TABLET | Freq: Every day | ORAL | 3 refills | Status: DC

## 2020-05-04 MED ORDER — VENLAFAXINE HCL ER 75 MG PO CP24: 75.0000 mg | ORAL_CAPSULE | Freq: Every day | ORAL | 2 refills | Status: DC

## 2020-05-12 ENCOUNTER — Other Ambulatory Visit: Payer: Self-pay

## 2020-05-12 ENCOUNTER — Ambulatory Visit: Payer: Medicare Other | Attending: Surgery | Admitting: Rehabilitation

## 2020-05-12 ENCOUNTER — Encounter: Payer: Self-pay | Admitting: Rehabilitation

## 2020-05-12 DIAGNOSIS — M25621 Stiffness of right elbow, not elsewhere classified: Secondary | ICD-10-CM

## 2020-05-12 DIAGNOSIS — R600 Localized edema: Secondary | ICD-10-CM | POA: Insufficient documentation

## 2020-05-12 DIAGNOSIS — R293 Abnormal posture: Secondary | ICD-10-CM | POA: Diagnosis not present

## 2020-05-12 DIAGNOSIS — Z483 Aftercare following surgery for neoplasm: Secondary | ICD-10-CM | POA: Insufficient documentation

## 2020-05-12 NOTE — Patient Instructions (Signed)
Access Code: [email or web address] URL: [email or web address]: 11/02/2021Prepared by: Marcene Brawn TevisExercises  Supine Shoulder Flexion Extension AAROM with Dowel - 1-2 x daily - 7 x weekly - 10 reps - 5 seconds hold  Shoulder Flexion Overhead with Dowel - 1-2 x daily - 7 x weekly - 10 reps - 5 second hold  Standing Shoulder Abduction ROM with Dowel - 1-2 x daily - 7 x weekly - 10 reps - 5 second hold

## 2020-05-12 NOTE — Therapy (Signed)
[place], [place], [address] Phone: [phone]   Fax:  [phone]  Physical Therapy Treatment  Patient Details  Name: Judith Chavez MRN: [id number] Date of Birth: [birth year] Referring Provider (PT): Dr. Ninfa Linden   Encounter Date: 05/12/2020   PT End of Session - 05/12/20 1344    Visit Number 3    Number of Visits 10    Date for PT Re-Evaluation 06/12/20    PT Start Time 1300    PT Stop Time 1342    PT Time Calculation (min) 42 min    Activity Tolerance Patient tolerated treatment well    Behavior During Therapy [facility] for tasks assessed/performed           Past Medical History:  Diagnosis Date   Anxiety    Arthritis    knees   Cancer ([place]) 03/2020   right breast DCIS, LCIS   Depression    Diabetes mellitus without complication ([place])    Type 2   Family history of breast cancer 02/28/2020   Headache    migraines   History of pneumonia    Numbness    "left foot"    Poor circulation     Past Surgical History:  Procedure Laterality Date   ABCESS DRAINAGE     "after cesarean- in hospital for 3 months"   [place] Right    MENISCUS REPAIR Bilateral    SIMPLE MASTECTOMY WITH AXILLARY SENTINEL NODE BIOPSY Right 04/13/2020   Procedure: RIGHT MASTECTOMY WITH RIGHT AXILLARY SENTINEL NODE BIOPSY;  Surgeon: Coralie Keens, MD;  Location: [facility];  Service: General;  Laterality: Right;   SPINE SURGERY      There were no vitals filed for this visit.   Subjective Assessment - 05/12/20 1301    Subjective The fluid is still there but no worse.  I am just having alot of pain in the chest.  Movement is getting a bit easier    Pertinent History Rt breast cancer DCIS, LCIS, ER/PR positive s/p mastectomy with 0/3 nodes removed on 04/13/20 Dr. Ninfa Linden and radiation status  unknown.  . Other history includes anxiety, OA, hysterectomy, DM    Currently in Pain? Yes    Pain Score 6     Pain Location Leg    Pain Orientation Right    Pain Descriptors / Indicators Aching;Burning    Pain Type Chronic pain   from the chronic back pain   Pain Onset More than a month ago    Pain Frequency Intermittent              [facility] PT Assessment - 05/12/20 0001      AROM   Right Shoulder Flexion 120 Degrees    Right Shoulder ABduction 156 Degrees             LYMPHEDEMA/ONCOLOGY QUESTIONNAIRE - 05/12/20 0001      Right Upper Extremity Lymphedema   10 cm Proximal to Olecranon Process 31 cm    Olecranon Process 26.5 cm    10 cm Proximal to Ulnar Styloid Process 21 cm    Just Proximal to Ulnar Styloid Process 16.3 cm    Across Hand at [employer] 20.4 cm    At [place] of 2nd Digit 6.4 cm  [facility] Adult PT Treatment/Exercise - 05/12/20 0001      Self-Care   Self-Care --   education on lymphedema surveillance and risk reduction     Exercises   Exercises Shoulder      Shoulder Exercises: Supine   Flexion Both;AAROM;5 reps    Flexion Limitations with dowel and cueing for initial performance      Manual Therapy   Manual Therapy Passive ROM;Manual Lymphatic Drainage (MLD);Edema management    Manual therapy comments remeasured circumferences    Passive ROM to the left shoulder into flexion, abduction, and Er at 45 deg of abduction. to tolerance                   PT Education - 05/12/20 1344    Education Details risk reduction    Person(s) Educated Patient    Methods Explanation;Handout    Comprehension Verbalized understanding               PT Long Term Goals - 05/12/20 1348      PT LONG TERM GOAL #1   Title Pt will be given risk reduction paper and education on lymphedema    Status Achieved      PT LONG TERM GOAL #2   Title pt will return Rt shoulder to baseline measurements    Status On-going      PT  LONG TERM GOAL #3   Title pt will decrease QDASH to less than 25%    Status On-going                 Plan - 05/12/20 1345    Clinical Impression Statement Pt demonstrates improved AROM but still limited, continued chest discomfort and seroma although appearing less today during palpation, and becoming tearful mainly about continued exacerbation of back and leg pain after surgery.  Encouraged pt to reach out to back surgeon and or PCP regarding new pain as well as some sleeping posture ideas.  pt tolerated PROM/AAROM well with minimal musclular tightness apparent.    PT Treatment/Interventions Therapeutic exercise;Patient/family education;Manual techniques;Manual lymph drainage;Taping;ADLs/Self Care Home Management    PT Next Visit Plan get more drained? check AROM, continue with PROM AAROM in clinic and advance HEP as tolerated. Attempt supine scap next,  MLD chest wall as needed    PT Home Exercise Plan post op,  [email or web address] for standing cane flexion, abduction, and supine dowel    Consulted and Agree with Plan of Care Patient           Patient will benefit from skilled therapeutic intervention in order to improve the following deficits and impairments:     Visit Diagnosis: Abnormal posture  Aftercare following surgery for neoplasm  Stiffness of right upper arm joint  Localized edema     Problem List Patient Active Problem List   Diagnosis Date Noted   History of right breast cancer 04/13/2020   Genetic testing 03/10/2020   Family history of breast cancer 02/28/2020   Malignant neoplasm of upper-outer quadrant of right breast in female, estrogen receptor positive ([place]) 02/26/2020   Primary osteoarthritis of right knee 03/20/2019   Primary osteoarthritis of left knee 03/20/2019   Primary osteoarthritis of both knees 04/24/2017   Spondylolisthesis at L3-L4 level 06/01/2015    [name] 05/12/2020, 1:48 PM  [place], [place], [address] Phone: [phone]   Fax:  [phone]  Name: Shalene Gallen MRN: [id number] Date of Birth: 12-09-1945

## 2020-05-21 ENCOUNTER — Other Ambulatory Visit: Payer: Self-pay

## 2020-05-21 ENCOUNTER — Encounter: Payer: Self-pay | Admitting: Rehabilitation

## 2020-05-21 ENCOUNTER — Ambulatory Visit: Payer: Medicare Other | Admitting: Rehabilitation

## 2020-05-21 DIAGNOSIS — Z483 Aftercare following surgery for neoplasm: Secondary | ICD-10-CM

## 2020-05-21 DIAGNOSIS — M25621 Stiffness of right elbow, not elsewhere classified: Secondary | ICD-10-CM

## 2020-05-21 DIAGNOSIS — R293 Abnormal posture: Secondary | ICD-10-CM | POA: Diagnosis not present

## 2020-05-21 DIAGNOSIS — R600 Localized edema: Secondary | ICD-10-CM | POA: Diagnosis not present

## 2020-05-21 NOTE — Patient Instructions (Addendum)
Access Code: [email or web address] URL: [email or web address] Date: 05/21/2020 Prepared by: Shan Levans  Exercises .Supine Shoulder Flexion Extension AAROM with Dowel - 1-2 x daily - 7 x weekly - 10 reps - 5 seconds hold .Supine Shoulder Horizontal Abduction with Resistance - 1 x daily - 3-4 x weekly - 1-3 sets - 10 reps .Supine Shoulder External Rotation with Resistance - 1 x daily - 3-4 x weekly - 1-3 sets - 10 reps .Supine PNF D2 Flexion with Resistance - 1 x daily - 7 x weekly - 10 reps - 1-3 sets .Standing Row with Anchored Resistance - 1 x daily - 3-4 x weekly - 1-3 sets - 10 reps - 2-3 second hold .Standing Overhead Triceps Stretch - 1 x daily - 7 x weekly - 1 sets - 3 reps - 20-30 seconds hold   Self manual lymph drainage: Perform this sequence once a day.  Only give enough pressure no your skin to make the skin move.  Diaphragmatic - Supine   Inhale through nose making navel move out toward hands. Exhale through puckered lips, hands follow navel in. Repeat _5__ times. Rest _10__ seconds between repeats.   Copyright  VHI. All rights reserved.  Hug yourself.  Do circles at your neck just above your collarbones.  Repeat this 10 times.  Axilla - One at a Time   Using full weight of flat hand and fingers at center of uninvolved armpit, make _10__ in-place circles.   Copyright  VHI. All rights reserved.  LEG: Inguinal Nodes Stimulation   With small finger side of hand against hip crease on involved side, gently perform circles at the crease. Repeat __10_ times.   Copyright  VHI. All rights reserved.  1) Axilla to Inguinal Nodes - Sweep   On involved side, sweep _4__ times from armpit along side of trunk to hip crease.  Now gently stretch skin from the involved side to the uninvolved side across the chest at the shoulder line.  Repeat that 4 times.  Draw an imaginary diagonal line from upper outer breast through the nipple area toward lower inner breast.  Direct  fluid upward and inward from this line toward the pathway across your upper chest .  Do this in three rows to treat all of the upper inner breast tissue, and do each row 3-4x.      Direct fluid to treat all of lower outer breast tissue downward and outward toward      pathway that is aimed at the left groin.  Finish by doing the pathways as described above going from your involved armpit to the same side groin and going across your upper chest from the involved shoulder to the uninvolved shoulder.  Repeat the steps above where you do circles in your left groin and right armpit. Copyright  VHI. All rights reserved.

## 2020-05-21 NOTE — Therapy (Signed)
[place], [place], [address] Phone: [phone]   Fax:  [phone]  Physical Therapy Treatment  Patient Details  Name: Judith Chavez MRN: [id number] Date of Birth: [birth year] Referring Provider (PT): Dr. Ninfa Linden   Encounter Date: 05/21/2020   PT End of Session - 05/21/20 1641    Visit Number 4    Number of Visits 10    Date for PT Re-Evaluation 06/12/20    PT Start Time 1501    PT Stop Time 1555    PT Time Calculation (min) 54 min    Activity Tolerance Patient tolerated treatment well    Behavior During Therapy [facility] for tasks assessed/performed           Past Medical History:  Diagnosis Date  . Anxiety   . Arthritis    knees  . Cancer ([place]) 03/2020   right breast DCIS, LCIS  . Depression   . Diabetes mellitus without complication (HCC)    Type 2  . Family history of breast cancer 02/28/2020  . Headache    migraines  . History of pneumonia   . Numbness    "left foot"   . Poor circulation     Past Surgical History:  Procedure Laterality Date  . ABCESS DRAINAGE     "after cesarean- in hospital for 3 months"  . ABDOMINAL HYSTERECTOMY    . CESAREAN SECTION  1970  . CHOLECYSTECTOMY    . COLONOSCOPY    . EYE SURGERY Right   . MENISCUS REPAIR Bilateral   . SIMPLE MASTECTOMY WITH AXILLARY SENTINEL NODE BIOPSY Right 04/13/2020   Procedure: RIGHT MASTECTOMY WITH RIGHT AXILLARY SENTINEL NODE BIOPSY;  Surgeon: Coralie Keens, MD;  Location: [place];  Service: General;  Laterality: Right;  . SPINE SURGERY      There were no vitals filed for this visit.   Subjective Assessment - 05/21/20 1501    Subjective They took out 56cc last time.  I go back on Monday to check it again. It felt like it filled back up.  He should sign the precription on Monday for the bra    Pertinent History Rt breast cancer DCIS, LCIS, ER/PR positive s/p mastectomy with 0/3 nodes removed on  04/13/20 Dr. Ninfa Linden and radiation status unknown.  . Other history includes anxiety, OA, hysterectomy, DM    Patient Stated Goals get information from providers    Currently in Pain? No/denies              [facility] PT Assessment - 05/21/20 0001      AROM   Right Shoulder Flexion 143 Degrees    Right Shoulder ABduction 166 Degrees                         [facility] Adult PT Treatment/Exercise - 05/21/20 0001      Shoulder Exercises: Supine   Horizontal ABduction Both;10 reps    Theraband Level (Shoulder Horizontal ABduction) Level 1 (Yellow)    External Rotation Both;10 reps    Theraband Level (Shoulder External Rotation) Level 1 (Yellow)    Diagonals Both    Theraband Level (Shoulder Diagonals) Level 1 (Yellow)      Shoulder Exercises: Standing   Row 10 reps    Theraband Level (Shoulder Row) Level 1 (Yellow)      Shoulder Exercises: Pulleys   Flexion 2 minutes    Flexion Limitations with cueing for initial performance  ABduction 2 minutes    ABduction Limitations with cueing       Shoulder Exercises: Therapy Ball   Flexion 5 reps      Manual Therapy   Manual Therapy Manual Lymphatic Drainage (MLD)    Edema Management gave pt prescription to get signed for compression bra/tank and sleeve on visit on Monday    Manual Lymphatic Drainage (MLD) 5 deep breaths, superficial and deep abdominals, Rt axillary and inguinal nodes then focus on clearing left right lower quadrant laterally to move fluid around old abdominal incision that is thick and tight more medially.  Then Rt axillo inguinal anastamosis also avoiding abdominal incision.  pt was given handout on this to try at home no interaxillary pathway for now     Passive ROM to the left shoulder into flexion, abduction, and Er at 45 deg of abduction. to tolerance                        PT Long Term Goals - 05/12/20 1348      PT LONG TERM GOAL #1   Title Pt will be given risk reduction paper and  education on lymphedema    Status Achieved      PT LONG TERM GOAL #2   Title pt will return Rt shoulder to baseline measurements    Status On-going      PT LONG TERM GOAL #3   Title pt will decrease QDASH to less than 25%    Status On-going                 Plan - 05/21/20 1641    Clinical Impression Statement Pt mentions old abdominal incision from csection and other old surgies and how it feels more swollen above the incision.  Pt does seem to have some blockage of edema proximal to this old incision as well as continuation of seroma at the chest wall.  MLD started today to abdomen and chest with pt reporting less abdominal tightness post MLD.  pt was encouraged to get a comparession tank due to this abominal swelling.    PT Duration 6 weeks    PT Treatment/Interventions Therapeutic exercise;Patient/family education;Manual techniques;Manual lymph drainage;Taping;ADLs/Self Care Home Management    PT Next Visit Plan get more drained? continue with PROM AAROM in clinic and advance HEP as tolerated. MLD chest wall and abdomen    PT Home Exercise Plan post op,  [id number] for supine dowel, supine scap series no flexion, standing row, and tricep stretch, self MLD    Consulted and Agree with Plan of Care Patient           Patient will benefit from skilled therapeutic intervention in order to improve the following deficits and impairments:     Visit Diagnosis: Abnormal posture  Aftercare following surgery for neoplasm  Stiffness of right upper arm joint  Localized edema     Problem List Patient Active Problem List   Diagnosis Date Noted  . History of right breast cancer 04/13/2020  . Genetic testing 03/10/2020  . Family history of breast cancer 02/28/2020  . Malignant neoplasm of upper-outer quadrant of right breast in female, estrogen receptor positive ([place]) 02/26/2020  . Primary osteoarthritis of right knee 03/20/2019  . Primary osteoarthritis of left knee 03/20/2019   . Primary osteoarthritis of both knees 04/24/2017  . Spondylolisthesis at L3-L4 level 06/01/2015    [name] 05/21/2020, 4:46 PM  [place], [place], [address] Phone: [phone]   Fax:  [phone]  Name: Judith Chavez MRN: [id number] Date of Birth: [birth year]

## 2020-05-26 ENCOUNTER — Encounter: Payer: Self-pay | Admitting: Orthopaedic Surgery

## 2020-05-26 ENCOUNTER — Ambulatory Visit (INDEPENDENT_AMBULATORY_CARE_PROVIDER_SITE_OTHER): Payer: Medicare Other | Admitting: Orthopaedic Surgery

## 2020-05-26 DIAGNOSIS — M1711 Unilateral primary osteoarthritis, right knee: Secondary | ICD-10-CM

## 2020-05-26 MED ORDER — LIDOCAINE HCL 1 % IJ SOLN
2.0000 mL | INTRAMUSCULAR | Status: AC | PRN
  Administered 2020-05-26: 2 mL

## 2020-05-26 MED ORDER — BUPIVACAINE HCL 0.5 % IJ SOLN
2.0000 mL | INTRAMUSCULAR | Status: AC | PRN
  Administered 2020-05-26: 2 mL via INTRA_ARTICULAR

## 2020-05-26 MED ORDER — METHYLPREDNISOLONE ACETATE 40 MG/ML IJ SUSP
40.0000 mg | INTRAMUSCULAR | Status: AC | PRN
  Administered 2020-05-26: 40 mg via INTRA_ARTICULAR

## 2020-05-26 NOTE — Progress Notes (Signed)
Office Visit Note   Patient: Judith Chavez           Date of Birth: 1945-11-08           MRN: [id number] Visit Date: 05/26/2020              Requested by: Judith Neer, MD [address] [place],  [place] [address] PCP: Judith Neer, MD   Assessment & Plan: Visit Diagnoses:  1. Primary osteoarthritis of right knee     Plan: Impression is right knee DJD.  Cortisone injection repeated today.  Pamphlet for [place] was provided today as well.  Follow-up as needed.  Follow-Up Instructions: Return if symptoms worsen or fail to improve.   Orders:  No orders of the defined types were placed in this encounter.  No orders of the defined types were placed in this encounter.     Procedures: Large Joint Inj: R knee on 05/26/2020 3:21 PM Indications: pain Details: 22 G needle  Arthrogram: No  Medications: 40 mg methylPREDNISolone acetate 40 MG/ML; 2 mL lidocaine 1 %; 2 mL bupivacaine 0.5 % Consent was given by the patient. Patient was prepped and draped in the usual sterile fashion.       Clinical Data: No additional findings.   Subjective: Chief Complaint  Patient presents with  . Right Knee - Pain    Judith Chavez returns today for right knee DJD and pain.  The Baker's cyst occasionally bothers her.  She is currently undergoing treatment for breast cancer.  Had a mastectomy recently.  She has not ready for knee replacement at this time.  Would like to get another cortisone injection today.   Review of Systems  Constitutional: Negative.   HENT: Negative.   Eyes: Negative.   Respiratory: Negative.   Cardiovascular: Negative.   Endocrine: Negative.   Musculoskeletal: Negative.   Neurological: Negative.   Hematological: Negative.   Psychiatric/Behavioral: Negative.   All other systems reviewed and are negative.    Objective: Vital Signs: There were no vitals taken for this visit.  Physical Exam Vitals and nursing note reviewed.  Constitutional:       Appearance: She is well-developed.  Pulmonary:     Effort: Pulmonary effort is normal.  Skin:    General: Skin is warm.     Capillary Refill: Capillary refill takes less than 2 seconds.  Neurological:     Mental Status: She is alert and oriented to person, place, and time.  Psychiatric:        Behavior: Behavior normal.        Thought Content: Thought content normal.        Judgment: Judgment normal.     Ortho Exam Right knee shows no joint effusion.  Patellofemoral crepitus with range of motion.  Because her cruciates are stable.  Specialty Comments:  No specialty comments available.  Imaging: No results found.   PMFS History: Patient Active Problem List   Diagnosis Date Noted  . History of right breast cancer 04/13/2020  . Genetic testing 03/10/2020  . Family history of breast cancer 02/28/2020  . Malignant neoplasm of upper-outer quadrant of right breast in female, estrogen receptor positive ([place]) 02/26/2020  . Primary osteoarthritis of right knee 03/20/2019  . Primary osteoarthritis of left knee 03/20/2019  . Primary osteoarthritis of both knees 04/24/2017  . Spondylolisthesis at L3-L4 level 06/01/2015   Past Medical History:  Diagnosis Date  . Anxiety   . Arthritis    knees  . Cancer (  [place]) 03/2020   right breast DCIS, LCIS  . Depression   . Diabetes mellitus without complication (HCC)    Type 2  . Family history of breast cancer 02/28/2020  . Headache    migraines  . History of pneumonia   . Numbness    "left foot"   . Poor circulation     Family History  Problem Relation Age of Onset  . Breast cancer Cousin        maternal; dx and d. <50  . Breast cancer Cousin        maternal; dx and d. <50  . Breast cancer Cousin        maternal; dx and d. <50  . Breast cancer Cousin        maternal; dx and d. <50  . Cancer Paternal Aunt        unknown type cancer dx 81s    Past Surgical History:  Procedure Laterality Date  . ABCESS DRAINAGE     "after  cesarean- in hospital for 3 months"  . ABDOMINAL HYSTERECTOMY    . CESAREAN SECTION  1970  . CHOLECYSTECTOMY    . COLONOSCOPY    . EYE SURGERY Right   . MENISCUS REPAIR Bilateral   . SIMPLE MASTECTOMY WITH AXILLARY SENTINEL NODE BIOPSY Right 04/13/2020   Procedure: RIGHT MASTECTOMY WITH RIGHT AXILLARY SENTINEL NODE BIOPSY;  Surgeon: Judith Keens, MD;  Location: [place];  Service: General;  Laterality: Right;  . SPINE SURGERY     Social History   Occupational History  . Occupation: retired   Tobacco Use  . Smoking status: Former Smoker    Packs/day: 0.50    Years: 10.00    Pack years: 5.00    Quit date: 07/11/1986    Years since quitting: 33.8  . Smokeless tobacco: Never Used  Substance and Sexual Activity  . Alcohol use: Yes    Comment: occassionally  . Drug use: No  . Sexual activity: Not on file

## 2020-05-27 ENCOUNTER — Other Ambulatory Visit: Payer: Self-pay | Admitting: Surgery

## 2020-05-27 ENCOUNTER — Other Ambulatory Visit: Payer: Self-pay | Admitting: Hematology

## 2020-05-27 DIAGNOSIS — R222 Localized swelling, mass and lump, trunk: Secondary | ICD-10-CM

## 2020-05-28 ENCOUNTER — Ambulatory Visit
Admission: RE | Admit: 2020-05-28 | Discharge: 2020-05-28 | Disposition: A | Discharge status: Home / Self Care | Payer: Medicare Other | Source: Ambulatory Visit | Attending: Surgery | Admitting: Surgery

## 2020-05-28 DIAGNOSIS — R222 Localized swelling, mass and lump, trunk: Secondary | ICD-10-CM

## 2020-05-28 DIAGNOSIS — R1900 Intra-abdominal and pelvic swelling, mass and lump, unspecified site: Secondary | ICD-10-CM | POA: Diagnosis not present

## 2020-05-29 ENCOUNTER — Other Ambulatory Visit: Payer: Self-pay

## 2020-05-29 ENCOUNTER — Encounter: Payer: Self-pay | Admitting: Rehabilitation

## 2020-05-29 ENCOUNTER — Ambulatory Visit: Payer: Medicare Other | Admitting: Rehabilitation

## 2020-05-29 DIAGNOSIS — M25621 Stiffness of right elbow, not elsewhere classified: Secondary | ICD-10-CM | POA: Diagnosis not present

## 2020-05-29 DIAGNOSIS — R600 Localized edema: Secondary | ICD-10-CM

## 2020-05-29 DIAGNOSIS — R293 Abnormal posture: Secondary | ICD-10-CM

## 2020-05-29 DIAGNOSIS — Z483 Aftercare following surgery for neoplasm: Secondary | ICD-10-CM

## 2020-05-29 NOTE — Therapy (Signed)
[place], [place], [address] Phone: [phone]   Fax:  [phone]  Physical Therapy Treatment  Patient Details  Name: Judith Chavez MRN: [id number] Date of Birth: 11-Jan-1946 Referring Provider (PT): Dr. Ninfa Linden   Encounter Date: 05/29/2020   PT End of Session - 05/29/20 1221    Visit Number 5    Number of Visits 10    Date for PT Re-Evaluation 06/12/20    PT Start Time 7782    PT Stop Time 1055    PT Time Calculation (min) 53 min    Activity Tolerance Patient tolerated treatment well    Behavior During Therapy [facility] for tasks assessed/performed           Past Medical History:  Diagnosis Date  . Anxiety   . Arthritis    knees  . Cancer ([place]) 03/2020   right breast DCIS, LCIS  . Depression   . Diabetes mellitus without complication (HCC)    Type 2  . Family history of breast cancer 02/28/2020  . Headache    migraines  . History of pneumonia   . Numbness    "left foot"   . Poor circulation     Past Surgical History:  Procedure Laterality Date  . ABCESS DRAINAGE     "after cesarean- in hospital for 3 months"  . ABDOMINAL HYSTERECTOMY    . CESAREAN SECTION  1970  . CHOLECYSTECTOMY    . COLONOSCOPY    . EYE SURGERY Right   . MENISCUS REPAIR Bilateral   . SIMPLE MASTECTOMY WITH AXILLARY SENTINEL NODE BIOPSY Right 04/13/2020   Procedure: RIGHT MASTECTOMY WITH RIGHT AXILLARY SENTINEL NODE BIOPSY;  Surgeon: Coralie Keens, MD;  Location: [place];  Service: General;  Laterality: Right;  . SPINE SURGERY      There were no vitals filed for this visit.   Subjective Assessment - 05/29/20 0958    Subjective Dr. Rush Farmer drained more 36cc again.  I see him again Dec 2 or 3.  He was also concerned about the swelling above the old scar so I had an [place].  It says no mass or fluid collection    Pertinent History Rt breast cancer DCIS, LCIS, ER/PR positive s/p mastectomy with  0/3 nodes removed on 04/13/20 Dr. Ninfa Linden and radiation status unknown.  . Other history includes anxiety, OA, hysterectomy, DM    Currently in Pain? No/denies                             [facility] Adult PT Treatment/Exercise - 05/29/20 0001      Manual Therapy   Manual Therapy Soft tissue mobilization;Myofascial release    Edema Management pt has appt for next week for garments    Soft tissue mobilization in left sidelying to the latissimus    Myofascial Release investigation in the abdomen and along all abdominal incisions for restriction with work on posterior back incision and up near the tube site with mild restriction and tenderness here.      Manual Lymphatic Drainage (MLD) 5 deep breaths, superficial and deep abdominals, Rt axillary and inguinal nodes then focus on clearing left right lower quadrant laterally to move fluid around old abdominal incision that is thick and tight more medially.  Then Rt axillo inguinal anastamosis also avoiding abdominal incision.  pt was given handout on this to try at home no interaxillary pathway for now  Passive ROM to the left shoulder into flexion, abduction, and Er at 45 deg of abduction. to tolerance and then into left sidelying posterior interaxillary work and axillo inguinal                        PT Long Term Goals - 05/12/20 1348      PT LONG TERM GOAL #1   Title Pt will be given risk reduction paper and education on lymphedema    Status Achieved      PT LONG TERM GOAL #2   Title pt will return Rt shoulder to baseline measurements    Status On-going      PT LONG TERM GOAL #3   Title pt will decrease QDASH to less than 25%    Status On-going                 Plan - 05/29/20 1221    Clinical Impression Statement MD was able to drain alot of fluid out the seroma again and was worried about the appearance of the puffines above the abdominal incision region but [place] confirmed no edema or abominal mass and  concluded most likely scar tissue.  Overall the abdomen is moble with skin movement and no significant restriction.  pt does have more firmess at the wasist and moving around to the front directily over the incision which could be scar tissue.  noted some incision restriction at the beginning of the back to abdominal incision in sidelying and near the drain site following the drain pathway.    PT Next Visit Plan get more drained/any garments? begin strengthening education/HEP as pt reports weakness is the biggest issue.  MLD to the abdomen as needed    PT Home Exercise Plan post op,  [id number] for supine dowel, supine scap series no flexion, standing row, and tricep stretch, self MLD    Consulted and Agree with Plan of Care Patient           Patient will benefit from skilled therapeutic intervention in order to improve the following deficits and impairments:     Visit Diagnosis: Abnormal posture  Aftercare following surgery for neoplasm  Stiffness of right upper arm joint  Localized edema     Problem List Patient Active Problem List   Diagnosis Date Noted  . History of right breast cancer 04/13/2020  . Genetic testing 03/10/2020  . Family history of breast cancer 02/28/2020  . Malignant neoplasm of upper-outer quadrant of right breast in female, estrogen receptor positive ([place]) 02/26/2020  . Primary osteoarthritis of right knee 03/20/2019  . Primary osteoarthritis of left knee 03/20/2019  . Primary osteoarthritis of both knees 04/24/2017  . Spondylolisthesis at L3-L4 level 06/01/2015    [name] 05/29/2020, 12:25 PM  [place], [place], [address] Phone: [phone]   Fax:  [phone]  Name: Judith Chavez MRN: [id number] Date of Birth: 11-12-1945

## 2020-06-01 ENCOUNTER — Ambulatory Visit: Payer: Medicare Other | Attending: Internal Medicine

## 2020-06-01 DIAGNOSIS — Z23 Encounter for immunization: Secondary | ICD-10-CM

## 2020-06-01 NOTE — Progress Notes (Signed)
   Covid-19 Vaccination Clinic  Name:  Hartlee Amedee    MRN: [id number] DOB: 05/11/1946  06/01/2020  Ms. Hench was observed post Covid-19 immunization for 15 minutes without incident. She was provided with Vaccine Information Sheet and instruction to access the V-Safe system.   Ms. Sakata was instructed to call 911 with any severe reactions post vaccine: Marland Kitchen Difficulty breathing  . Swelling of face and throat  . A fast heartbeat  . A bad rash all over body  . Dizziness and weakness   Immunizations Administered    Name Date Dose VIS Date Route   Pfizer COVID-19 Vaccine 06/01/2020  3:09 PM 0.3 mL 04/29/2020 Intramuscular   Manufacturer: [place]   Lot: [id number]   [place]: [address]-1

## 2020-06-08 DIAGNOSIS — S99921A Unspecified injury of right foot, initial encounter: Secondary | ICD-10-CM | POA: Diagnosis not present

## 2020-06-08 DIAGNOSIS — M25571 Pain in right ankle and joints of right foot: Secondary | ICD-10-CM | POA: Diagnosis not present

## 2020-06-09 ENCOUNTER — Encounter: Payer: Self-pay | Admitting: Rehabilitation

## 2020-06-09 ENCOUNTER — Other Ambulatory Visit: Payer: Self-pay

## 2020-06-09 ENCOUNTER — Ambulatory Visit: Payer: Medicare Other | Admitting: Rehabilitation

## 2020-06-09 DIAGNOSIS — M25621 Stiffness of right elbow, not elsewhere classified: Secondary | ICD-10-CM | POA: Diagnosis not present

## 2020-06-09 DIAGNOSIS — R293 Abnormal posture: Secondary | ICD-10-CM | POA: Diagnosis not present

## 2020-06-09 DIAGNOSIS — Z483 Aftercare following surgery for neoplasm: Secondary | ICD-10-CM

## 2020-06-09 DIAGNOSIS — R600 Localized edema: Secondary | ICD-10-CM | POA: Diagnosis not present

## 2020-06-09 NOTE — Therapy (Signed)
[place], [place], [address] Phone: [phone]   Fax:  [phone]  Physical Therapy Treatment  Patient Details  Name: Judith Chavez MRN: [id number] Date of Birth: 02-Dec-1945 Referring Provider (PT): Dr. Ninfa Linden   Encounter Date: 06/09/2020   PT End of Session - 06/09/20 1358    Visit Number 6    Number of Visits 10    Date for PT Re-Evaluation 06/12/20    PT Start Time 1102    PT Stop Time 1155    PT Time Calculation (min) 53 min    Activity Tolerance Patient tolerated treatment well    Behavior During Therapy [facility] for tasks assessed/performed           Past Medical History:  Diagnosis Date   Anxiety    Arthritis    knees   Cancer ([place]) 03/2020   right breast DCIS, LCIS   Depression    Diabetes mellitus without complication ([place])    Type 2   Family history of breast cancer 02/28/2020   Headache    migraines   History of pneumonia    Numbness    "left foot"    Poor circulation     Past Surgical History:  Procedure Laterality Date   ABCESS DRAINAGE     "after cesarean- in hospital for 3 months"   [place] Right    MENISCUS REPAIR Bilateral    SIMPLE MASTECTOMY WITH AXILLARY SENTINEL NODE BIOPSY Right 04/13/2020   Procedure: RIGHT MASTECTOMY WITH RIGHT AXILLARY SENTINEL NODE BIOPSY;  Surgeon: Coralie Keens, MD;  Location: [place];  Service: General;  Laterality: Right;   SPINE SURGERY      There were no vitals filed for this visit.   Subjective Assessment - 06/09/20 1108    Subjective I got a compression tank top and she ordered another one to use.  The abdomen has been feeling the same.  I go back to the MD this Friday.    Pertinent History Rt breast cancer DCIS, LCIS, ER/PR positive s/p mastectomy with 0/3 nodes removed on 04/13/20 Dr. Ninfa Linden  and radiation status unknown.  . Other history includes anxiety, OA, hysterectomy, DM    Currently in Pain? No/denies                             [facility] Adult PT Treatment/Exercise - 06/09/20 0001      Exercises   Other Exercises  performed in seated today: alternating flexion 1# x 10 bil, abduction 1# x 10 bil, overhead press 1# x 10 bil, ER 1# x 10 bil, row with red band around feet x 10 and updated HEP to show final HEP recommendations      Shoulder Exercises: Seated   External Rotation Both;10 reps    External Rotation Weight (lbs) 1    Flexion Left;Both;10 reps    Flexion Weight (lbs) 1      Manual Therapy   Soft tissue mobilization in left sidelying to the latissimus    Manual Lymphatic Drainage (MLD) 5 deep breaths, superficial and deep abdominals, Rt axillary and inguinal nodes then focus on clearing left right lower quadrant laterally to move fluid around old abdominal incision that is thick and tight more medially.  Then Rt axillo inguinal anastamosis also avoiding abdominal incision.  pt was given handout on this to try at home no interaxillary pathway for now     Passive ROM to the Rt shoulder into flexion, abduction, and Er at 45 deg of abduction. to tolerance and then into left sidelying posterior interaxillary work and axillo inguinal                        PT Long Term Goals - 06/09/20 1401      PT LONG TERM GOAL #1   Title Pt will be given risk reduction paper and education on lymphedema    Status Achieved      PT LONG TERM GOAL #2   Title pt will return Rt shoulder to baseline measurements    Status On-going      PT LONG TERM GOAL #3   Title pt will decrease QDASH to less than 25%    Status On-going                 Plan - 06/09/20 1358    Clinical Impression Statement Pt had sprained her ankle 2 days ago and is limping with some edema but reports overall better.  Exercises performed in the chair per instruction section  with education on how to increase weight as tolerated and monitor swelling.  Pt continues with compalints of Rt abdominal and chest tightness and swelling that fluctuates as well as improvement after PT sessions.  Pt will focus on TE at home and will continue to work on MT here in the clinic.    PT Frequency 1x / week    PT Duration 6 weeks    PT Treatment/Interventions Therapeutic exercise;Patient/family education;Manual techniques;Manual lymph drainage;Taping;ADLs/Self Care Home Management    PT Next Visit Plan stretching, MLD, scar tissue work Rt upper quadrant    PT Home Exercise Plan post op,  [email or web address]    Consulted and Agree with Plan of Care Patient           Patient will benefit from skilled therapeutic intervention in order to improve the following deficits and impairments:     Visit Diagnosis: Abnormal posture  Aftercare following surgery for neoplasm  Stiffness of right upper arm joint  Localized edema     Problem List Patient Active Problem List   Diagnosis Date Noted   History of right breast cancer 04/13/2020   Genetic testing 03/10/2020   Family history of breast cancer 02/28/2020   Malignant neoplasm of upper-outer quadrant of right breast in female, estrogen receptor positive ([place]) 02/26/2020   Primary osteoarthritis of right knee 03/20/2019   Primary osteoarthritis of left knee 03/20/2019   Primary osteoarthritis of both knees 04/24/2017   Spondylolisthesis at L3-L4 level 06/01/2015    Judith Chavez 06/09/2020, 2:01 PM  [place], [place], [address] Phone: [phone]   Fax:  [phone]  Name: Judith Chavez MRN: [id number] Date of Birth: 20-Mar-1946

## 2020-06-09 NOTE — Patient Instructions (Signed)
Access Code: [email or web address] URL: [email or web address] Date: 06/09/2020 Prepared by: Shan Levans  Program Notes  No resistance if not cleared by your surgeon yet  Exercises .Supine Shoulder Flexion Extension AAROM with Dowel - 1-2 x daily - 7 x weekly - 10 reps - 5 seconds hold .Standing Overhead Triceps Stretch - 1 x daily - 7 x weekly - 1 sets - 3 reps - 20-30 seconds hold .Seated Shoulder Flexion Full Range Single Arm - 1 x daily - 3 x weekly - 5-10 reps .Seated Shoulder External Rotation - 1 x daily - 3 x weekly - 5-10 reps .Seated Overhead Press with Dumbbells - 1 x daily - 3 x weekly - 1-3 sets - 5-10 reps .Seated Shoulder Abduction with Dumbbells - Thumbs Up - 1 x daily - 3 x weekly - 5-10 reps .Seated Shoulder Row with Resistance Anchored at Feet - 1 x daily - 3 x weekly - 5-10 reps

## 2020-06-11 DIAGNOSIS — F322 Major depressive disorder, single episode, severe without psychotic features: Secondary | ICD-10-CM | POA: Diagnosis not present

## 2020-06-11 DIAGNOSIS — C50919 Malignant neoplasm of unspecified site of unspecified female breast: Secondary | ICD-10-CM | POA: Diagnosis not present

## 2020-06-11 DIAGNOSIS — G4700 Insomnia, unspecified: Secondary | ICD-10-CM | POA: Diagnosis not present

## 2020-06-11 DIAGNOSIS — Z0000 Encounter for general adult medical examination without abnormal findings: Secondary | ICD-10-CM | POA: Diagnosis not present

## 2020-06-11 DIAGNOSIS — A6000 Herpesviral infection of urogenital system, unspecified: Secondary | ICD-10-CM | POA: Diagnosis not present

## 2020-06-11 DIAGNOSIS — M549 Dorsalgia, unspecified: Secondary | ICD-10-CM | POA: Diagnosis not present

## 2020-06-11 DIAGNOSIS — Z23 Encounter for immunization: Secondary | ICD-10-CM | POA: Diagnosis not present

## 2020-06-11 DIAGNOSIS — E559 Vitamin D deficiency, unspecified: Secondary | ICD-10-CM | POA: Diagnosis not present

## 2020-06-11 DIAGNOSIS — E1169 Type 2 diabetes mellitus with other specified complication: Secondary | ICD-10-CM | POA: Diagnosis not present

## 2020-06-11 DIAGNOSIS — M1990 Unspecified osteoarthritis, unspecified site: Secondary | ICD-10-CM | POA: Diagnosis not present

## 2020-06-11 DIAGNOSIS — M8580 Other specified disorders of bone density and structure, unspecified site: Secondary | ICD-10-CM | POA: Diagnosis not present

## 2020-06-11 DIAGNOSIS — I10 Essential (primary) hypertension: Secondary | ICD-10-CM | POA: Diagnosis not present

## 2020-06-16 ENCOUNTER — Other Ambulatory Visit: Payer: Self-pay | Admitting: Surgery

## 2020-06-16 DIAGNOSIS — R222 Localized swelling, mass and lump, trunk: Secondary | ICD-10-CM

## 2020-06-23 ENCOUNTER — Encounter: Payer: Medicare Other | Admitting: Rehabilitation

## 2020-06-30 ENCOUNTER — Other Ambulatory Visit: Payer: Self-pay | Admitting: Family Medicine

## 2020-06-30 ENCOUNTER — Other Ambulatory Visit: Payer: Self-pay | Admitting: Advanced Practice Midwife

## 2020-06-30 DIAGNOSIS — M8580 Other specified disorders of bone density and structure, unspecified site: Secondary | ICD-10-CM

## 2020-07-02 ENCOUNTER — Encounter: Payer: Medicare Other | Admitting: Rehabilitation

## 2020-07-06 ENCOUNTER — Ambulatory Visit
Admission: RE | Admit: 2020-07-06 | Discharge: 2020-07-06 | Disposition: A | Discharge status: Home / Self Care | Payer: Medicare Other | Source: Ambulatory Visit | Attending: Surgery | Admitting: Surgery

## 2020-07-06 ENCOUNTER — Other Ambulatory Visit: Payer: Self-pay

## 2020-07-06 DIAGNOSIS — K7689 Other specified diseases of liver: Secondary | ICD-10-CM | POA: Diagnosis not present

## 2020-07-06 DIAGNOSIS — E278 Other specified disorders of adrenal gland: Secondary | ICD-10-CM | POA: Diagnosis not present

## 2020-07-06 DIAGNOSIS — J984 Other disorders of lung: Secondary | ICD-10-CM | POA: Diagnosis not present

## 2020-07-06 DIAGNOSIS — D3500 Benign neoplasm of unspecified adrenal gland: Secondary | ICD-10-CM | POA: Diagnosis not present

## 2020-07-06 DIAGNOSIS — R222 Localized swelling, mass and lump, trunk: Secondary | ICD-10-CM

## 2020-07-06 MED ORDER — IOPAMIDOL (ISOVUE-300) INJECTION 61%
100.0000 mL | Freq: Once | INTRAVENOUS | Status: AC | PRN
  Administered 2020-07-06: 100 mL via INTRAVENOUS

## 2020-07-07 ENCOUNTER — Encounter: Payer: Medicare Other | Admitting: Rehabilitation

## 2020-07-14 ENCOUNTER — Encounter: Payer: Medicare Other | Admitting: Rehabilitation

## 2020-07-29 NOTE — Progress Notes (Signed)
[place]   [phone] [phone]   Clinic Follow up Note   Patient Care Team: Mayra Neer, MD as PCP - General (Family Medicine) Coralie Keens, MD as Consulting Physician (General Surgery) Truitt Merle, MD as Consulting Physician (Hematology) Gery Pray, MD as Consulting Physician (Radiation Oncology) Mauro Kaufmann, RN as Oncology Nurse Navigator Rockwell Germany, RN as Oncology Nurse Navigator Alla Feeling, NP as Nurse Practitioner (Nurse Practitioner) 07/30/2020  CLINIC: Survivorship   CHIEF COMPLAINT: Follow-up right breast LCIS  SUMMARY OF ONCOLOGIC HISTORY: Oncology History Overview Note  Cancer Staging Malignant neoplasm of upper-outer quadrant of right breast in female, estrogen receptor positive ([place]) Staging form: Breast, AJCC 8th Edition - Clinical stage from 02/17/2020: Stage 0 (cTis (DCIS), cN0, cM0, ER+, PR+) - Signed by Gardenia Phlegm, NP on 02/26/2020 - Pathologic stage from 04/13/2020: Stage 0 (pTis (DCIS), pN0, cM0, G3, ER+, PR+) - Signed by Alla Feeling, NP on 07/30/2020    Malignant neoplasm of upper-outer quadrant of right breast in female, estrogen receptor positive ([place])  02/13/2020 Mammogram   IMPRESSION: Highly suspicious calcifications within the upper-outer quadrant of the RIGHT breast, measuring 2.8 cm extent, for which stereotactic biopsy is recommended.   02/17/2020 Cancer Staging   Staging form: Breast, AJCC 8th Edition - Clinical stage from 02/17/2020: Stage 0 (cTis (DCIS), cN0, cM0, ER+, PR+) - Signed by Gardenia Phlegm, NP on 02/26/2020   02/17/2020 Initial Biopsy   Diagnosis Breast, right, needle core biopsy, lateral - DUCTAL CARCINOMA IN SITU, HIGH-GRADE WITH NECROSIS AND CALCIFICATIONS. SEE NOTE - LOBULAR CARCINOMA IN SITU Diagnosis Note DCIS measures 0.5 cm in greatest linear dimension. Dr. Saralyn Pilar reviewed the case and concurs with the diagnosis. A breast prognostic profile  (ER, PR) is pending and will be reported in an addendum. The [facility] was notified on 02/18/2020.   02/17/2020 Receptors her2   PROGNOSTIC INDICATORS Results: IMMUNOHISTOCHEMICAL AND MORPHOMETRIC ANALYSIS PERFORMED MANUALLY Estrogen Receptor: 95%, POSITIVE, MODERATE STAINING INTENSITY Progesterone Receptor: 70%, POSITIVE, MODERATE STAINING INTENSITY   02/26/2020 Initial Diagnosis   Malignant neoplasm of upper-outer quadrant of right breast in female, estrogen receptor positive ([place])   03/08/2020 Genetic Testing   No pathogenic variants detected in [facility] Common Hereditary Cancers Panel. The Common Hereditary Cancers Panel offered by [facility] includes sequencing and/or deletion duplication testing of the following 48 genes: APC, ATM, AXIN2, BARD1, BMPR1A, BRCA1, BRCA2, BRIP1, CDH1, CDK4, CDKN2A (p14ARF), CDKN2A (p16INK4a), CHEK2, CTNNA1, DICER1, EPCAM (Deletion/duplication testing only), GREM1 (promoter region deletion/duplication testing only), KIT, MEN1, MLH1, MSH2, MSH3, MSH6, MUTYH, NBN, NF1, NHTL1, PALB2, PDGFRA, PMS2, POLD1, POLE, PTEN, RAD50, RAD51C, RAD51D, RNF43, SDHB, SDHC, SDHD, SMAD4, SMARCA4. STK11, TP53, TSC1, TSC2, and VHL.  The following genes were evaluated for sequence changes only: SDHA and HOXB13 c.251G>A variant only. The report date is March 08, 2020.   03/08/2020 Breast MRI   IMPRESSION: 1. Suspicious, linear enhancement involving the upper-outer quadrant of the right breast from mid to posterior depth. Susceptibility artifact from post-biopsy clip is seen along the posterior, inferior margin. The overall suspicious finding span approximately 4.2 x 2.8 cm in the AP by transverse dimensions. Recommendation is for MRI guided biopsy along the anterior margin of this enhancement, particularly at the irregular focus identified on series 10, image 81/144. 2. No suspicious MRI findings in the remainder of the right breast. 3. No MRI evidence of  malignancy on the left. 4. No suspicious lymphadenopathy.   03/20/2020 Pathology Results  Diagnosis Breast, right, needle core biopsy, outer - LOBULAR CARCINOMA IN SITU. Microscopic Comment Immunohistochemistry for E-cadherin is negative. P63, Calponin and SMM-1 demonstrate the presence of myoepithelium. CKAE1AE3 is negative for an occult process. Results reported to [facility] on 03/23/2020. Dr. Melina Copa reviewed.   04/13/2020 Surgery   RIGHT MASTECTOMY WITH RIGHT AXILLARY SENTINEL NODE BIOPSY by Dr Ninf[facility]   04/13/2020 Pathology Results   FINAL MICROSCOPIC DIAGNOSIS:   A. BREAST, RIGHT, MASTECTOMY:  - Ductal carcinoma in situ (DCIS), high-grade with focal necrosis and  calcifications, spanning an area of 4.2 cm  - Lobular carcinoma in situ  - Resection margins are negative for DCIS  - Biopsy site changes  - See oncology table   B. LYMPH NODE, RIGHT AXILLARY, SENTINEL, EXCISION:  - Lymph node, negative for carcinoma (0/1)   C. LYMPH NODE, RIGHT AXILLARY, SENTINEL, EXCISION:  - Lymph node, negative for carcinoma (0/1)   D. LYMPH NODE, RIGHT AXILLARY, SENTINEL, EXCISION:  - Lymph node, negative for carcinoma (0/1)    04/13/2020 Cancer Staging   Staging form: Breast, AJCC 8th Edition - Pathologic stage from 04/13/2020: Stage 0 (pTis (DCIS), pN0, cM0, G3, ER+, PR+) - Signed by Alla Feeling, NP on 07/30/2020   05/29/2020 -  Anti-estrogen oral therapy   Tamoxifen 20mg  once daily    07/30/2020 Survivorship   SCP delivered by Cira Rue, NP      CURRENT THERAPY: Tamoxifen 20mg  once daily starting by 07/2020  INTERVAL HISTORY:   CLINIC:  Survivorship   REASON FOR VISIT:  Routine follow-up post-treatment for a recent history of breast cancer.  BRIEF ONCOLOGIC HISTORY:  Oncology History Overview Note  Cancer Staging Malignant neoplasm of upper-outer quadrant of right breast in female, estrogen receptor positive ([place]) Staging form: Breast, AJCC 8th  Edition - Clinical stage from 02/17/2020: Stage 0 (cTis (DCIS), cN0, cM0, ER+, PR+) - Signed by Gardenia Phlegm, NP on 02/26/2020 - Pathologic stage from 04/13/2020: Stage 0 (pTis (DCIS), pN0, cM0, G3, ER+, PR+) - Signed by Alla Feeling, NP on 07/30/2020    Malignant neoplasm of upper-outer quadrant of right breast in female, estrogen receptor positive ([place])  02/13/2020 Mammogram   IMPRESSION: Highly suspicious calcifications within the upper-outer quadrant of the RIGHT breast, measuring 2.8 cm extent, for which stereotactic biopsy is recommended.   02/17/2020 Cancer Staging   Staging form: Breast, AJCC 8th Edition - Clinical stage from 02/17/2020: Stage 0 (cTis (DCIS), cN0, cM0, ER+, PR+) - Signed by Gardenia Phlegm, NP on 02/26/2020   02/17/2020 Initial Biopsy   Diagnosis Breast, right, needle core biopsy, lateral - DUCTAL CARCINOMA IN SITU, HIGH-GRADE WITH NECROSIS AND CALCIFICATIONS. SEE NOTE - LOBULAR CARCINOMA IN SITU Diagnosis Note DCIS measures 0.5 cm in greatest linear dimension. Dr. Saralyn Pilar reviewed the case and concurs with the diagnosis. A breast prognostic profile (ER, PR) is pending and will be reported in an addendum. The [facility] was notified on 02/18/2020.   02/17/2020 Receptors her2   PROGNOSTIC INDICATORS Results: IMMUNOHISTOCHEMICAL AND MORPHOMETRIC ANALYSIS PERFORMED MANUALLY Estrogen Receptor: 95%, POSITIVE, MODERATE STAINING INTENSITY Progesterone Receptor: 70%, POSITIVE, MODERATE STAINING INTENSITY   02/26/2020 Initial Diagnosis   Malignant neoplasm of upper-outer quadrant of right breast in female, estrogen receptor positive ([place])   03/08/2020 Genetic Testing   No pathogenic variants detected in [facility] Common Hereditary Cancers Panel. The Common Hereditary Cancers Panel offered by [facility] includes sequencing and/or deletion duplication testing of the following 48 genes: APC, ATM,  AXIN2, BARD1, BMPR1A, BRCA1, BRCA2, BRIP1,  CDH1, CDK4, CDKN2A (p14ARF), CDKN2A (p16INK4a), CHEK2, CTNNA1, DICER1, EPCAM (Deletion/duplication testing only), GREM1 (promoter region deletion/duplication testing only), KIT, MEN1, MLH1, MSH2, MSH3, MSH6, MUTYH, NBN, NF1, NHTL1, PALB2, PDGFRA, PMS2, POLD1, POLE, PTEN, RAD50, RAD51C, RAD51D, RNF43, SDHB, SDHC, SDHD, SMAD4, SMARCA4. STK11, TP53, TSC1, TSC2, and VHL.  The following genes were evaluated for sequence changes only: SDHA and HOXB13 c.251G>A variant only. The report date is March 08, 2020.   03/08/2020 Breast MRI   IMPRESSION: 1. Suspicious, linear enhancement involving the upper-outer quadrant of the right breast from mid to posterior depth. Susceptibility artifact from post-biopsy clip is seen along the posterior, inferior margin. The overall suspicious finding span approximately 4.2 x 2.8 cm in the AP by transverse dimensions. Recommendation is for MRI guided biopsy along the anterior margin of this enhancement, particularly at the irregular focus identified on series 10, image 81/144. 2. No suspicious MRI findings in the remainder of the right breast. 3. No MRI evidence of malignancy on the left. 4. No suspicious lymphadenopathy.   03/20/2020 Pathology Results   Diagnosis Breast, right, needle core biopsy, outer - LOBULAR CARCINOMA IN SITU. Microscopic Comment Immunohistochemistry for E-cadherin is negative. P63, Calponin and SMM-1 demonstrate the presence of myoepithelium. CKAE1AE3 is negative for an occult process. Results reported to [facility] on 03/23/2020. Dr. Melina Copa reviewed.   04/13/2020 Surgery   RIGHT MASTECTOMY WITH RIGHT AXILLARY SENTINEL NODE BIOPSY by Dr Ninfa [facility]   04/13/2020 Pathology Results   FINAL MICROSCOPIC DIAGNOSIS:   A. BREAST, RIGHT, MASTECTOMY:  - Ductal carcinoma in situ (DCIS), high-grade with focal necrosis and  calcifications, spanning an area of 4.2 cm  - Lobular carcinoma in situ  - Resection margins are negative for  DCIS  - Biopsy site changes  - See oncology table   B. LYMPH NODE, RIGHT AXILLARY, SENTINEL, EXCISION:  - Lymph node, negative for carcinoma (0/1)   C. LYMPH NODE, RIGHT AXILLARY, SENTINEL, EXCISION:  - Lymph node, negative for carcinoma (0/1)   D. LYMPH NODE, RIGHT AXILLARY, SENTINEL, EXCISION:  - Lymph node, negative for carcinoma (0/1)    04/13/2020 Cancer Staging   Staging form: Breast, AJCC 8th Edition - Pathologic stage from 04/13/2020: Stage 0 (pTis (DCIS), pN0, cM0, G3, ER+, PR+) - Signed by Alla Feeling, NP on 07/30/2020   05/29/2020 -  Anti-estrogen oral therapy   Tamoxifen $RemoveBe'20mg'BoEdilchP$  once daily    07/30/2020 Survivorship   SCP delivered by Cira Rue, NP      INTERVAL HISTORY:  Ms. Bumgardner presents to the [facility] today for our initial meeting to review her survivorship care plan detailing her treatment course for breast cancer, as well as monitoring long-term side effects of that treatment, education regarding health maintenance, screening, and overall wellness and health promotion.     She is tearful today, arrived late and is overwhelmed with all her appointments. She started tamoxifen on 05/29/20. Pre-existing joint aches are stable. She has new pain in hands and leg cramps that are worse at night. She remains functional, does stretching and walks.  Hot flashes are manageable on effexor. Also has headaches periodically. She denies new lump/mass or nodularity along the chest wall or left breast. Denies recent fever, chills, cough, chest pain, dyspnea, leg edema, or bleeding.    ONCOLOGY TREATMENT TEAM:  1. Surgeon:  Dr. [facility]inf[facility] at [facility] 2. Medical Oncologist: Dr. Burr Medico     PAST MEDICAL/SURGICAL HISTORY:  Past Medical History:  Diagnosis Date  . Anxiety   . Arthritis    knees  . Cancer ([place]) 03/2020   right breast DCIS, LCIS  . Depression   . Diabetes mellitus without complication (H[facility])    Type 2  . Family history of breast cancer  02/28/2020  . Headache    migraines  . History of pneumonia   . Numbness    "left foot"   . Poor circulation    Past Surgical History:  Procedure Laterality Date  . ABCESS DRAINAGE     "after cesarean- in hospital for 3 months"  . ABDOMINAL HYSTERECTOMY    . CESAREAN SECTION  1970  . CHOLECYSTECTOMY    . COLONOSCOPY    . EYE SURGERY Right   . MENISCUS REPAIR Bilateral   . SIMPLE MASTECTOMY WITH AXILLARY SENTINEL NODE BIOPSY Right 04/13/2020   Procedure: RIGHT MASTECTOMY WITH RIGHT AXILLARY SENTINEL NODE BIOPSY;  Surgeon: Coralie Keens, MD;  Location: [facility];  Service: General;  Laterality: Right;  . SPINE SURGERY       ALLERGIES:  No Known Allergies   CURRENT MEDICATIONS:  Outpatient Encounter Medications as of 07/30/2020  Medication Sig Note  . ALPRAZolam (XANAX) 0.5 MG tablet Take 0.5 mg by mouth 2 (two) times daily as needed.   . calcium-vitamin D 250-100 MG-UNIT tablet Take 1 tablet by mouth daily.   . Cholecalciferol (VITAMIN D-3 PO) Take 1 tablet by mouth daily.   Marland Kitchen NOVOFINE 32G X 6 MM MISC AS DIRECTED ONCE A DAY WITH TRESIBA FLEXTOUCH   . ONETOUCH DELICA LANCETS 96V MISC 2 (two) times daily. for testing 01/14/2016: Received from: External Pharmacy  . ONETOUCH VERIO test strip USE AS DIRECTED TO TEST BLOOD SUGAR TWICE A DAY 01/14/2016: Received from: External Pharmacy  . sitaGLIPtin (JANUVIA) 100 MG tablet Take 100 mg by mouth daily.   . tamoxifen (NOLVADEX) 20 MG tablet Take 1 tablet (20 mg total) by mouth daily.   . traMADol (ULTRAM) 50 MG tablet Take 1 tablet (50 mg total) by mouth every 6 (six) hours as needed for moderate pain or severe pain.   . TRESIBA FLEXTOUCH 100 UNIT/ML SOPN FlexTouch Pen 30 Units.    . Triamcinolone Acetonide (NASACORT ALLERGY 24HR NA) Place 1 spray into both nostrils daily as needed (for allergies).   . venlafaxine XR (EFFEXOR-XR) 75 MG 24 hr capsule TAKE 1 CAPSULE BY MOUTH DAILY WITH BREAKFAST.   . [DISCONTINUED]  tamoxifen (NOLVADEX) 20 MG tablet Take 1 tablet (20 mg total) by mouth daily.    No facility-administered encounter medications on file as of 07/30/2020.     ONCOLOGIC FAMILY HISTORY:  Family History  Problem Relation Age of Onset  . Breast cancer Cousin        maternal; dx and d. <50  . Breast cancer Cousin        maternal; dx and d. <50  . Breast cancer Cousin        maternal; dx and d. <50  . Breast cancer Cousin        maternal; dx and d. <50  . Cancer Paternal Aunt        unknown type cancer dx 71s     GENETIC COUNSELING/TESTING: Yes   SOCIAL HISTORY:  Dietra Stokely denies any current or history of tobacco, alcohol, or illicit drug use.     PHYSICAL EXAMINATION:  Vital Signs:   Vitals:   07/30/20 1609  BP: (!) 173/77  Pulse: 92  Resp: 20  Temp: 98.1 F (36.7 C)  SpO2: 98%   Filed Weights   07/30/20 1609  Weight: 164 lb 3.2 oz (74.5 kg)   General: well-appearing female in no acute distress.   HEENT: Sclerae anicteric.  Lymph: No cervical, supraclavicular, or infraclavicular lymphadenopathy noted on palpation.  Respiratory: breathing non-labored.  GI: Abdomen soft and round; non-tender, non-distended. Bowel sounds normoactive.  Neuro: No focal deficits. Steady gait.  Psych: tearful.  Extremities: No edema. MSK: No focal spinal tenderness to palpation.  Full range of motion in bilateral upper extremities Skin: Warm and dry. Breast: s/p right mastectomy. Incision completely healed with scar tissue. No nodularity or mass along the right chest wall, left breast, or either axilla that I could appreciate    LABORATORY DATA:  None for this visit.  DIAGNOSTIC IMAGING:  None for this visit.      ASSESSMENT AND PLAN:  Ms.. Szeliga is a pleasant 75 y.o. female with Stage 0 right breast DCIS and LCIS ER/PR+, diagnosed in 02/2020, treated with right mastectomy and anti-estrogen therapy with tamoxifen beginning in 05/2020.  She presents to the Survivorship Clinic  for our initial meeting and routine follow-up post-completion of treatment for breast cancer.    1. Stage 0 DCIS/LCIS right breast:  Ms. Mundo is continuing to recover from definitive treatment for breast cancer. She will follow-up with her medical oncologist, Dr. Burr Medico in 10/2020 with history and physical exam per surveillance protocol.  She will continue her anti-estrogen therapy with Tamoxifen. Thus far, she is tolerating moderately well with joint aches, headache, and hot flashes. She was instructed to make Dr. Burr Medico or myself aware if she begins to experience any worsening side effects of the medication and I could see her back in clinic to help manage those side effects, as needed.  Today, a comprehensive survivorship care plan and treatment summary was reviewed with the patient today detailing her breast cancer diagnosis, treatment course, potential late/long-term effects of treatment, appropriate follow-up care with recommendations for the future, and patient education resources.  A copy of this summary, along with a letter will be sent to the patient's primary care provider via In [employer] message after today's visit.    2. Bone health:  Given Ms. Ferrando's age/history of breast cancer, she is at risk for bone demineralization. I do not have results of her last DEXA, she is scheduled on 10/06/2020. in the meantime, she was encouraged to increase her consumption of foods rich in calcium, as well as increase her weight-bearing activities, and continue cal/vit D supplements.  She was given education on specific activities to promote bone health.  3. Cancer screening:  Due to Ms. Tofte's history and her age, she should receive screening for skin cancers and colon cancer. She does not recall the date of her last colonoscopy or where it was performed. She will discuss with her PCP. She is s/p hysterectomy. Information and recommendations are listed on the patient's comprehensive care plan/treatment summary and  were reviewed in detail with the patient.   4. Health maintenance and wellness promotion: Ms. Vaccaro was encouraged to consume 5-7 servings of fruits and vegetables per day. We reviewed the "Nutrition Rainbow" handout, as well as the handout "Take Control of Your Health and Reduce Your Cancer Risk" from the [place].  She was also encouraged to engage in moderate to vigorous exercise for 30 minutes per day most days of the week. We discussed the [employer] fitness program, which is designed for cancer survivors  to help them become more physically fit after cancer treatments.  She was instructed to limit her alcohol consumption and continue to abstain from tobacco use.    5. Support services/counseling: It is not uncommon for this period of the patient's cancer care trajectory to be one of many emotions and stressors.  We discussed an opportunity for her to participate in the next session of [facility] ("Finding Your New Normal") support group series designed for patients after they have completed treatment.   Ms. Meckley was encouraged to take advantage of our many other support services programs, support groups, and/or counseling in coping with her new life as a cancer survivor after completing anti-cancer treatment.  She was offered support today through active listening and expressive supportive counseling.  She was given information regarding our available services and encouraged to contact me with any questions or for help enrolling in any of our support group/programs.    Dispo:   -Return to cancer center 10/2020  -Mammogram due in 02/2021 (ordered) -Follow up with surgery 07/31/20 then as scheduled  -She is welcome to return back to the Survivorship Clinic at any time; no additional follow-up needed at this time.  -Consider referral back to survivorship as a long-term survivor for continued surveillance  Orders Placed This Encounter  Procedures  . MM DIAG BREAST TOMO UNI LEFT     Standing Status:   Future    Standing Expiration Date:   07/30/2021    Order Specific Question:   Reason for Exam (SYMPTOM  OR DIAGNOSIS REQUIRED)    Answer:   h/o right breast DCIS and LCIS s/p mastectomy    Order Specific Question:   Preferred imaging location?    Answer:   [facility]  . CBC with Differential (Cancer Center Only)    Standing Status:   Standing    Number of Occurrences:   10    Standing Expiration Date:   07/30/2021  . CMP ([facility] only)    Standing Status:   Standing    Number of Occurrences:   10    Standing Expiration Date:   07/30/2021    A total of (30) minutes of face-to-face time was spent with this patient with greater than 50% of that time in counseling and care-coordination.   Cira Rue, NP 07/30/2020  [place] [phone]   Note: PRIMARY CARE PROVIDER Mayra Neer, [place] [phone]

## 2020-07-30 ENCOUNTER — Inpatient Hospital Stay: Payer: Medicare Other | Attending: Hematology | Admitting: Nurse Practitioner

## 2020-07-30 ENCOUNTER — Encounter: Payer: Self-pay | Admitting: Nurse Practitioner

## 2020-07-30 ENCOUNTER — Other Ambulatory Visit: Payer: Self-pay

## 2020-07-30 VITALS — BP 173/77 | HR 92 | Temp 98.1°F | Resp 20 | Ht 62.0 in | Wt 164.2 lb

## 2020-07-30 DIAGNOSIS — Z9011 Acquired absence of right breast and nipple: Secondary | ICD-10-CM | POA: Insufficient documentation

## 2020-07-30 DIAGNOSIS — Z8701 Personal history of pneumonia (recurrent): Secondary | ICD-10-CM | POA: Diagnosis not present

## 2020-07-30 DIAGNOSIS — Z79811 Long term (current) use of aromatase inhibitors: Secondary | ICD-10-CM | POA: Diagnosis not present

## 2020-07-30 DIAGNOSIS — R252 Cramp and spasm: Secondary | ICD-10-CM | POA: Insufficient documentation

## 2020-07-30 DIAGNOSIS — E119 Type 2 diabetes mellitus without complications: Secondary | ICD-10-CM | POA: Insufficient documentation

## 2020-07-30 DIAGNOSIS — D0511 Intraductal carcinoma in situ of right breast: Secondary | ICD-10-CM | POA: Insufficient documentation

## 2020-07-30 DIAGNOSIS — Z79899 Other long term (current) drug therapy: Secondary | ICD-10-CM | POA: Insufficient documentation

## 2020-07-30 DIAGNOSIS — Z170 Estrogen receptor positive status [ER+]: Secondary | ICD-10-CM | POA: Diagnosis not present

## 2020-07-30 DIAGNOSIS — M79642 Pain in left hand: Secondary | ICD-10-CM | POA: Diagnosis not present

## 2020-07-30 DIAGNOSIS — Z803 Family history of malignant neoplasm of breast: Secondary | ICD-10-CM | POA: Diagnosis not present

## 2020-07-30 DIAGNOSIS — Z809 Family history of malignant neoplasm, unspecified: Secondary | ICD-10-CM | POA: Insufficient documentation

## 2020-07-30 DIAGNOSIS — C50411 Malignant neoplasm of upper-outer quadrant of right female breast: Secondary | ICD-10-CM

## 2020-07-30 DIAGNOSIS — M79641 Pain in right hand: Secondary | ICD-10-CM | POA: Diagnosis not present

## 2020-07-30 DIAGNOSIS — Z794 Long term (current) use of insulin: Secondary | ICD-10-CM | POA: Insufficient documentation

## 2020-07-30 DIAGNOSIS — Z90710 Acquired absence of both cervix and uterus: Secondary | ICD-10-CM | POA: Insufficient documentation

## 2020-07-30 MED ORDER — TAMOXIFEN CITRATE 20 MG PO TABS
20.0000 mg | ORAL_TABLET | Freq: Every day | ORAL | 1 refills | Status: DC
Start: 2020-07-30 — End: 2021-06-09

## 2020-07-31 DIAGNOSIS — D0511 Intraductal carcinoma in situ of right breast: Secondary | ICD-10-CM | POA: Diagnosis not present

## 2020-07-31 DIAGNOSIS — R222 Localized swelling, mass and lump, trunk: Secondary | ICD-10-CM | POA: Diagnosis not present

## 2020-07-31 DIAGNOSIS — D0501 Lobular carcinoma in situ of right breast: Secondary | ICD-10-CM | POA: Diagnosis not present

## 2020-08-03 ENCOUNTER — Telehealth: Payer: Self-pay | Admitting: Nurse Practitioner

## 2020-08-03 ENCOUNTER — Telehealth: Payer: Self-pay | Admitting: Hematology

## 2020-08-03 ENCOUNTER — Other Ambulatory Visit: Payer: Self-pay

## 2020-08-03 ENCOUNTER — Ambulatory Visit: Payer: Medicare Other | Attending: Surgery

## 2020-08-03 DIAGNOSIS — Z483 Aftercare following surgery for neoplasm: Secondary | ICD-10-CM | POA: Insufficient documentation

## 2020-08-03 NOTE — Telephone Encounter (Signed)
Scheduled appts per 1/20 los. Pt confirmed appt dates and times.

## 2020-08-03 NOTE — Telephone Encounter (Signed)
Scheduled appt per 1/20 sch msg - mailed letter with appt date and time   

## 2020-08-03 NOTE — Therapy (Signed)
[place], [place], [address] Phone: [phone]   Fax:  [phone]  Physical Therapy Treatment  Patient Details  Name: Judith Chavez MRN: [id number] Date of Birth: January 02, 1946 Referring Provider (PT): Dr. Ninfa Linden   Encounter Date: 08/03/2020   PT End of Session - 08/03/20 1703    Visit Number 6   # unchanged due to screen only   Number of Visits 10    PT Start Time 4268    PT Stop Time 1645    PT Time Calculation (min) 21 min    Activity Tolerance Patient tolerated treatment well    Behavior During Therapy [facility] for tasks assessed/performed           Past Medical History:  Diagnosis Date  . Anxiety   . Arthritis    knees  . Cancer ([place]) 03/2020   right breast DCIS, LCIS  . Depression   . Diabetes mellitus without complication (HCC)    Type 2  . Family history of breast cancer 02/28/2020  . Headache    migraines  . History of pneumonia   . Numbness    "left foot"   . Poor circulation     Past Surgical History:  Procedure Laterality Date  . ABCESS DRAINAGE     "after cesarean- in hospital for 3 months"  . ABDOMINAL HYSTERECTOMY    . CESAREAN SECTION  1970  . CHOLECYSTECTOMY    . COLONOSCOPY    . EYE SURGERY Right   . MENISCUS REPAIR Bilateral   . SIMPLE MASTECTOMY WITH AXILLARY SENTINEL NODE BIOPSY Right 04/13/2020   Procedure: RIGHT MASTECTOMY WITH RIGHT AXILLARY SENTINEL NODE BIOPSY;  Surgeon: Coralie Keens, MD;  Location: [place];  Service: General;  Laterality: Right;  . SPINE SURGERY      There were no vitals filed for this visit.   Subjective Assessment - 08/03/20 1628    Subjective Pt returns for her 3 month L-Dex screen.    Pertinent History Rt breast cancer DCIS, LCIS, ER/PR positive s/p mastectomy with 0/3 nodes removed on 04/13/20 Dr. Ninfa Linden and radiation status unknown.  . Other history includes anxiety, OA, hysterectomy, DM                   L-DEX FLOWSHEETS - 08/03/20 1600      L-DEX LYMPHEDEMA SCREENING   Measurement Type Unilateral    L-DEX MEASUREMENT EXTREMITY Upper Extremity    POSITION  Standing    DOMINANT SIDE Right    At Risk Side Right    BASELINE SCORE (UNILATERAL) -4.4    L-DEX SCORE (UNILATERAL) 2.1    VALUE CHANGE (UNILAT) 6.5                                  PT Long Term Goals - 06/09/20 1401      PT LONG TERM GOAL #1   Title Pt will be given risk reduction paper and education on lymphedema    Status Achieved      PT LONG TERM GOAL #2   Title pt will return Rt shoulder to baseline measurements    Status On-going      PT LONG TERM GOAL #3   Title pt will decrease QDASH to less than 25%    Status On-going                 Plan - 08/03/20  72    Clinical Impression Statement Pt returns for her 3 month L-Dex screen, Her change from baseline of 6.5 was right at the limit that is considered subclinical lymphedema so she was fitted for a compression sleeve at our clinic before leaving today. Pt was instructed to wear this for 10 hrs/day every day for next month and she is scheduled to return for another L-Dex screen for reassess in 1 month. If her change from baselin has reduced to <6.5 she resume every 3 month L-Dex screens. However, if her change from baseline has plateaued or increased, it will be suggested to initiate treatment for lymphedema. Pt is agreeable to this.    PT Next Visit Plan Pt to return in 1 month for reassess of L-Dex screen after wearing her compression sleeve daily.    Consulted and Agree with Plan of Care Patient           Patient will benefit from skilled therapeutic intervention in order to improve the following deficits and impairments:     Visit Diagnosis: Aftercare following surgery for neoplasm     Problem List Patient Active Problem List   Diagnosis Date Noted  . History of right breast cancer 04/13/2020  . Genetic  testing 03/10/2020  . Family history of breast cancer 02/28/2020  . Malignant neoplasm of upper-outer quadrant of right breast in female, estrogen receptor positive ([place]) 02/26/2020  . Primary osteoarthritis of right knee 03/20/2019  . Primary osteoarthritis of left knee 03/20/2019  . Primary osteoarthritis of both knees 04/24/2017  . Spondylolisthesis at L3-L4 level 06/01/2015    Otelia Limes, PTA 08/03/2020, 5:10 PM  [place], [place], [address] Phone: [phone]   Fax:  [phone]  Name: Judith Chavez MRN: [id number] Date of Birth: December 16, 1945

## 2020-08-05 ENCOUNTER — Telehealth: Payer: Self-pay

## 2020-08-05 NOTE — Telephone Encounter (Signed)
Returned pts phone call. The compression sleeve issued to her from our clinic on Monday due to subclinical lymphedema detected during her lymphedema index screen with our [facility] unit is rolling down her arm and felt too tight so instructed pt to call [facility] for an appointment to be measured for a better fitting compression sleeve. Pt verbalized understanding. This therapist then called [facility] informing them of her call and to bill [employer] for said garment.

## 2020-08-13 ENCOUNTER — Other Ambulatory Visit: Payer: Self-pay

## 2020-08-13 ENCOUNTER — Ambulatory Visit (INDEPENDENT_AMBULATORY_CARE_PROVIDER_SITE_OTHER): Payer: Medicare Other | Admitting: Plastic Surgery

## 2020-08-13 ENCOUNTER — Encounter: Payer: Self-pay | Admitting: Plastic Surgery

## 2020-08-13 VITALS — BP 141/69 | HR 78 | Ht 61.0 in | Wt 162.8 lb

## 2020-08-13 DIAGNOSIS — Z853 Personal history of malignant neoplasm of breast: Secondary | ICD-10-CM | POA: Diagnosis not present

## 2020-08-13 DIAGNOSIS — R1900 Intra-abdominal and pelvic swelling, mass and lump, unspecified site: Secondary | ICD-10-CM | POA: Diagnosis not present

## 2020-08-13 NOTE — Progress Notes (Signed)
Referring Provider Mayra Neer, MD [place] [employer] [place],  [place] [id number]   CC:  Chief Complaint  Patient presents with  . consult      Judith Chavez is an 75 y.o. female.  HPI: Patient presents with a complaint of an abdominal bulge after mastectomy on the right side. She did not require any adjuvant treatment for her breast cancer. She complains of an upper abdominal bulge that is inferior to her mastectomy defect but superior to a large oblique abdominal scar. She describes a very difficult hospital course after a C-section in [place] in the 70s. She required a full exploratory laparotomy from the sound of it and multiple abdominal procedures for infection. She says a portion of her liver was removed. She has external eschar extending from near the xiphoid along the subcostal area all the way around to her back. This causes fairly significant depression which is accentuating the bulge above it. She is bothered by the appearance which was not noticed with her breast in place. She is interested in anything that can be done to improve the contour. She has had CT scan and ultrasound of this area revealing no obvious mass or fluid collection.  No Known Allergies  Outpatient Encounter Medications as of 08/13/2020  Medication Sig Note  . ALPRAZolam (XANAX) 0.5 MG tablet Take 0.5 mg by mouth 2 (two) times daily as needed.   . calcium-vitamin D 250-100 MG-UNIT tablet Take 1 tablet by mouth daily.   . Cholecalciferol (VITAMIN D-3 PO) Take 1 tablet by mouth daily.   Marland Kitchen NOVOFINE 32G X 6 MM MISC AS DIRECTED ONCE A DAY WITH TRESIBA FLEXTOUCH   . ONETOUCH DELICA LANCETS 10G MISC 2 (two) times daily. for testing 01/14/2016: Received from: External Pharmacy  . ONETOUCH VERIO test strip USE AS DIRECTED TO TEST BLOOD SUGAR TWICE A DAY 01/14/2016: Received from: External Pharmacy  . sitaGLIPtin (JANUVIA) 100 MG tablet Take 100 mg by mouth daily.   . tamoxifen (NOLVADEX) 20 MG tablet Take 1  tablet (20 mg total) by mouth daily.   . TRESIBA FLEXTOUCH 100 UNIT/ML SOPN FlexTouch Pen 30 Units.    . Triamcinolone Acetonide (NASACORT ALLERGY 24HR NA) Place 1 spray into both nostrils daily as needed (for allergies).   . venlafaxine XR (EFFEXOR-XR) 75 MG 24 hr capsule TAKE 1 CAPSULE BY MOUTH DAILY WITH BREAKFAST.   Marland Kitchen traMADol (ULTRAM) 50 MG tablet Take 1 tablet (50 mg total) by mouth every 6 (six) hours as needed for moderate pain or severe pain.    No facility-administered encounter medications on file as of 08/13/2020.     Past Medical History:  Diagnosis Date  . Anxiety   . Arthritis    knees  . Cancer ([place]) 03/2020   right breast DCIS, LCIS  . Depression   . Diabetes mellitus without complication (HCC)    Type 2  . Family history of breast cancer 02/28/2020  . Headache    migraines  . History of pneumonia   . Numbness    "left foot"   . Poor circulation     Past Surgical History:  Procedure Laterality Date  . ABCESS DRAINAGE     "after cesarean- in hospital for 3 months"  . ABDOMINAL HYSTERECTOMY    . CESAREAN SECTION  1970  . CHOLECYSTECTOMY    . COLONOSCOPY    . EYE SURGERY Right   . MENISCUS REPAIR Bilateral   . SIMPLE MASTECTOMY WITH AXILLARY SENTINEL NODE  BIOPSY Right 04/13/2020   Procedure: RIGHT MASTECTOMY WITH RIGHT AXILLARY SENTINEL NODE BIOPSY;  Surgeon: Coralie Keens, MD;  Location: [place];  Service: General;  Laterality: Right;  . SPINE SURGERY      Family History  Problem Relation Age of Onset  . Breast cancer Cousin        maternal; dx and d. <50  . Breast cancer Cousin        maternal; dx and d. <50  . Breast cancer Cousin        maternal; dx and d. <50  . Breast cancer Cousin        maternal; dx and d. <50  . Cancer Paternal Aunt        unknown type cancer dx 53s    Social History   Social History Narrative  . Not on file     Review of Systems General: Denies fevers, chills, weight loss CV: Denies chest pain,  shortness of breath, palpitations  Physical Exam Vitals with BMI 08/13/2020 07/30/2020 05/04/2020  Height 5\' 1"  5\' 2"  5\' 2"   Weight 162 lbs 13 oz 164 lbs 3 oz 154 lbs 2 oz  BMI 30.78 80.99 83.38  Systolic 250 539 767  Diastolic 69 77 91  Pulse 78 92 78    General:  No acute distress,  Alert and oriented, Non-Toxic, Normal speech and affect Examination shows a notable bulge and soft tissue excess between her subcostal scar and her mastectomy defect. The mastectomy defect has healed nicely with no evidence of skin breakdown and no palpable masses. I do not appreciate any obvious masses or hernias on abdominal exam. Her subcostal scar is tethering quite a bit down to the fascia it feels like.  CT scan was reviewed showing no hernias and no obvious soft tissue masses in the area of concern.  Assessment/Plan Patient presents with an abdominal soft tissue bulge that has become more prominent after recent mastectomy. I think the combination of the mastectomy defect above the bulge combined with the subcostal scar tethering below the bulge have caused it to be more prominent. She is particularly bothered by this as its the most prominent area on that side of her body when she is wearing close and finds it very difficult to find clothes that she is comfortable in. Given that this is a contour irregularity that has developed after cancer treatment I think it is reasonable to pursue reconstruction of this. My advice was a scar revision of her subcostal scar that would hopefully alleviate some of the tethering in that location combined with liposuction of the soft tissue. I think this would improve the contour quite a bit in that area. I drew out my planned incisions and her scar would fall along where her current subcostal scar is. She is in agreement with the plan and wants to move forward. Risks and benefits were discussed include bleeding, infection, damage surrounding structures and need for additional  procedures. Persistent contour irregularities are possible. We will plan to seek insurance approval to move forward.  Cindra Presume 08/13/2020, 4:31 PM

## 2020-08-28 ENCOUNTER — Telehealth: Payer: Self-pay | Admitting: Nurse Practitioner

## 2020-08-28 DIAGNOSIS — R300 Dysuria: Secondary | ICD-10-CM | POA: Diagnosis not present

## 2020-08-28 DIAGNOSIS — N3001 Acute cystitis with hematuria: Secondary | ICD-10-CM | POA: Diagnosis not present

## 2020-08-28 NOTE — Telephone Encounter (Signed)
Contacted patient, patient is aware that upcoming appointment is virtual. Per provider request.

## 2020-09-02 ENCOUNTER — Encounter: Payer: Self-pay | Admitting: Nurse Practitioner

## 2020-09-02 ENCOUNTER — Other Ambulatory Visit: Payer: Self-pay

## 2020-09-02 ENCOUNTER — Inpatient Hospital Stay: Payer: Medicare Other | Attending: Hematology | Admitting: Nurse Practitioner

## 2020-09-02 VITALS — BP 142/78 | HR 82 | Temp 97.9°F | Resp 18 | Ht 61.0 in | Wt 164.4 lb

## 2020-09-02 DIAGNOSIS — R232 Flushing: Secondary | ICD-10-CM | POA: Insufficient documentation

## 2020-09-02 DIAGNOSIS — C50411 Malignant neoplasm of upper-outer quadrant of right female breast: Secondary | ICD-10-CM | POA: Diagnosis not present

## 2020-09-02 DIAGNOSIS — Z170 Estrogen receptor positive status [ER+]: Secondary | ICD-10-CM | POA: Insufficient documentation

## 2020-09-02 DIAGNOSIS — D0511 Intraductal carcinoma in situ of right breast: Secondary | ICD-10-CM | POA: Insufficient documentation

## 2020-09-02 DIAGNOSIS — Z9011 Acquired absence of right breast and nipple: Secondary | ICD-10-CM | POA: Insufficient documentation

## 2020-09-02 DIAGNOSIS — F419 Anxiety disorder, unspecified: Secondary | ICD-10-CM | POA: Insufficient documentation

## 2020-09-02 DIAGNOSIS — Z79899 Other long term (current) drug therapy: Secondary | ICD-10-CM | POA: Insufficient documentation

## 2020-09-02 DIAGNOSIS — Z79810 Long term (current) use of selective estrogen receptor modulators (SERMs): Secondary | ICD-10-CM | POA: Insufficient documentation

## 2020-09-02 DIAGNOSIS — M2550 Pain in unspecified joint: Secondary | ICD-10-CM | POA: Insufficient documentation

## 2020-09-02 DIAGNOSIS — F32A Depression, unspecified: Secondary | ICD-10-CM | POA: Insufficient documentation

## 2020-09-02 MED ORDER — VENLAFAXINE HCL ER 75 MG PO CP24
75.0000 mg | ORAL_CAPSULE | Freq: Every day | ORAL | 1 refills | Status: DC
Start: 2020-09-02 — End: 2021-02-17

## 2020-09-02 MED ORDER — VENLAFAXINE HCL ER 37.5 MG PO CP24: 37.5000 mg | ORAL_CAPSULE | Freq: Every day | ORAL | 1 refills | Status: DC

## 2020-09-02 NOTE — Progress Notes (Signed)
[place]   [phone] Fax:([phone]) [phone]   Clinic Follow up Note   Patient Care Team: Judith Neer, MD as PCP - General (Family Medicine) Judith Keens, MD as Consulting Physician (General Surgery) Judith Merle, MD as Consulting Physician (Hematology) Judith Pray, MD as Consulting Physician (Radiation Oncology) Judith Kaufmann, RN as Oncology Nurse Navigator Judith Germany, RN as Oncology Nurse Navigator Judith Feeling, NP as Nurse Practitioner (Nurse Practitioner) 09/02/2020  CHIEF COMPLAINT: Follow-up right breast LCIS, right chest wall and axilla concern  SUMMARY OF ONCOLOGIC HISTORY: Oncology History Overview Note  Cancer Staging Malignant neoplasm of upper-outer quadrant of right breast in female, estrogen receptor positive (Judith Chavez) Staging form: Breast, AJCC 8th Edition - Clinical stage from 02/17/2020: Stage 0 (cTis (DCIS), cN0, cM0, ER+, PR+) - Signed by Judith Phlegm, NP on 02/26/2020 - Pathologic stage from 04/13/2020: Stage 0 (pTis (DCIS), pN0, cM0, G3, ER+, PR+) - Signed by Judith Feeling, NP on 07/30/2020    Malignant neoplasm of upper-outer quadrant of right breast in female, estrogen receptor positive (Judith Chavez)  02/13/2020 Mammogram   IMPRESSION: Highly suspicious calcifications within Judith upper-outer quadrant of Judith RIGHT breast, measuring 2.8 cm extent, for which stereotactic biopsy is recommended.   02/17/2020 Cancer Staging   Staging form: Breast, AJCC 8th Edition - Clinical stage from 02/17/2020: Stage 0 (cTis (DCIS), cN0, cM0, ER+, PR+) - Signed by Judith Phlegm, NP on 02/26/2020   02/17/2020 Initial Biopsy   Diagnosis Breast, right, needle core biopsy, lateral - DUCTAL CARCINOMA IN SITU, HIGH-GRADE WITH NECROSIS AND CALCIFICATIONS. SEE NOTE - LOBULAR CARCINOMA IN SITU Diagnosis Note DCIS measures 0.5 cm in greatest linear dimension. Judith Chavez reviewed Judith case and concurs with Judith diagnosis. A breast  prognostic profile (ER, PR) is pending and will be reported in an addendum. Judith Judith Chavez was notified on 02/18/2020.   02/17/2020 Receptors her2   PROGNOSTIC INDICATORS Results: IMMUNOHISTOCHEMICAL AND MORPHOMETRIC ANALYSIS PERFORMED MANUALLY Estrogen Receptor: 95%, POSITIVE, MODERATE STAINING INTENSITY Progesterone Receptor: 70%, POSITIVE, MODERATE STAINING INTENSITY   02/26/2020 Initial Diagnosis   Malignant neoplasm of upper-outer quadrant of right breast in female, estrogen receptor positive ([place])   03/08/2020 Genetic Testing   No pathogenic variants detected in [facility] Common Hereditary Cancers Panel. Judith Common Hereditary Cancers Panel offered by [facility] includes sequencing and/or deletion duplication testing of Judith following 48 genes: APC, ATM, AXIN2, BARD1, BMPR1A, BRCA1, BRCA2, BRIP1, CDH1, CDK4, CDKN2A (p14ARF), CDKN2A (p16INK4a), CHEK2, CTNNA1, DICER1, EPCAM (Deletion/duplication testing only), GREM1 (promoter region deletion/duplication testing only), KIT, MEN1, MLH1, MSH2, MSH3, MSH6, MUTYH, NBN, NF1, NHTL1, PALB2, PDGFRA, PMS2, POLD1, POLE, PTEN, RAD50, RAD51C, RAD51D, RNF43, SDHB, SDHC, SDHD, SMAD4, SMARCA4. STK11, TP53, TSC1, TSC2, and VHL.  Judith following genes were evaluated for sequence changes only: SDHA and HOXB13 c.251G>A variant only. Judith report date is March 08, 2020.   03/08/2020 Breast MRI   IMPRESSION: 1. Suspicious, linear enhancement involving Judith upper-outer quadrant of Judith right breast from mid to posterior depth. Susceptibility artifact from post-biopsy clip is seen along Judith posterior, inferior margin. Judith overall suspicious finding span approximately 4.2 x 2.8 cm in Judith AP by transverse dimensions. Recommendation is for MRI guided biopsy along Judith anterior margin of this enhancement, particularly at Judith irregular focus identified on series 10, image 81/144. 2. No suspicious MRI findings in Judith remainder of Judith right breast. 3. No MRI  evidence of malignancy on Judith left. 4. No suspicious lymphadenopathy.   03/20/2020 Pathology  Results   Diagnosis Breast, right, needle core biopsy, outer - LOBULAR CARCINOMA IN SITU. Microscopic Comment Immunohistochemistry for E-cadherin is negative. P63, Calponin and SMM-1 demonstrate Judith presence of myoepithelium. CKAE1AE3 is negative for an occult process. Results reported to Judith Chavez on 03/23/2020. Judith Chavez reviewed.   04/13/2020 Surgery   RIGHT MASTECTOMY WITH RIGHT AXILLARY SENTINEL NODE BIOPSY by Dr Judith Chavez   04/13/2020 Pathology Results   FINAL MICROSCOPIC DIAGNOSIS:   A. BREAST, RIGHT, MASTECTOMY:  - Ductal carcinoma in situ (DCIS), high-grade with focal necrosis and  calcifications, spanning an area of 4.2 cm  - Lobular carcinoma in situ  - Resection margins are negative for DCIS  - Biopsy site changes  - See oncology table   B. LYMPH NODE, RIGHT AXILLARY, SENTINEL, EXCISION:  - Lymph node, negative for carcinoma (0/1)   C. LYMPH NODE, RIGHT AXILLARY, SENTINEL, EXCISION:  - Lymph node, negative for carcinoma (0/1)   D. LYMPH NODE, RIGHT AXILLARY, SENTINEL, EXCISION:  - Lymph node, negative for carcinoma (0/1)    04/13/2020 Cancer Staging   Staging form: Breast, AJCC 8th Edition - Pathologic stage from 04/13/2020: Stage 0 (pTis (DCIS), pN0, cM0, G3, ER+, PR+) - Signed by Judith Feeling, NP on 07/30/2020   05/29/2020 -  Anti-estrogen oral therapy   Tamoxifen $RemoveBe'20mg'IMLFUlVzR$  once daily    07/30/2020 Survivorship   SCP delivered by Judith Rue, NP      CURRENT THERAPY: Tamoxifen p.o. once daily since 05/29/2020  INTERVAL HISTORY: Judith Chavez presents for follow-up, which initially began as a virtual encounter but she was brought in due to right chest wall concern.  She has developed new "knots like a marble" on Judith right chest wall and axilla.  These are occasionally tender.  Denies having recent injection on that arm or infection.  She is also under  treatment for UTI.  Is otherwise doing well, continues tamoxifen.  Baseline joint pains are stable without new pain.  Hot flashes are "terrible" but manageable.  She is on Effexor 75 mg.  Her mood fluctuates.  Previous headaches have resolved.  Denies recent fever, chills, cough, chest pain, dyspnea, leg edema, bleeding, or other new concerns.  She is having surgery by Dr. Claudia Desanctis on 4/8 to remove excess tissue from her abdomen.   MEDICAL HISTORY:  Past Medical History:  Diagnosis Date  . Anxiety   . Arthritis    knees  . Cancer ([place]) 03/2020   right breast DCIS, LCIS  . Depression   . Diabetes mellitus without complication (HCC)    Type 2  . Family history of breast cancer 02/28/2020  . Headache    migraines  . History of pneumonia   . Numbness    "left foot"   . Poor circulation     SURGICAL HISTORY: Past Surgical History:  Procedure Laterality Date  . ABCESS DRAINAGE     "after cesarean- in hospital for 3 months"  . ABDOMINAL HYSTERECTOMY    . CESAREAN SECTION  1970  . CHOLECYSTECTOMY    . COLONOSCOPY    . EYE SURGERY Right   . MENISCUS REPAIR Bilateral   . SIMPLE MASTECTOMY WITH AXILLARY SENTINEL NODE BIOPSY Right 04/13/2020   Procedure: RIGHT MASTECTOMY WITH RIGHT AXILLARY SENTINEL NODE BIOPSY;  Surgeon: Judith Keens, MD;  Location: [facility];  Service: General;  Laterality: Right;  . SPINE SURGERY      I have reviewed Judith social history and family history with Judith patient and  they are unchanged from previous note.  ALLERGIES:  has No Known Allergies.  MEDICATIONS:  Current Outpatient Medications  Medication Sig Dispense Refill  . venlafaxine XR (EFFEXOR-XR) 37.5 MG 24 hr capsule Take 1 capsule (37.5 mg total) by mouth daily with breakfast. Take with 75 mg tab 90 capsule 1  . ALPRAZolam (XANAX) 0.5 MG tablet Take 0.5 mg by mouth 2 (two) times daily as needed.    . calcium-vitamin D 250-100 MG-UNIT tablet Take 1 tablet by mouth daily.    .  Cholecalciferol (VITAMIN D-3 PO) Take 1 tablet by mouth daily.    Marland Kitchen NOVOFINE 32G X 6 MM MISC AS DIRECTED ONCE A DAY WITH TRESIBA FLEXTOUCH    . ONETOUCH DELICA LANCETS 09Q MISC 2 (two) times daily. for testing  2  . ONETOUCH VERIO test strip USE AS DIRECTED TO TEST BLOOD SUGAR TWICE A DAY  3  . sitaGLIPtin (JANUVIA) 100 MG tablet Take 100 mg by mouth daily.    . tamoxifen (NOLVADEX) 20 MG tablet Take 1 tablet (20 mg total) by mouth daily. 90 tablet 1  . traMADol (ULTRAM) 50 MG tablet Take 1 tablet (50 mg total) by mouth every 6 (six) hours as needed for moderate pain or severe pain. 25 tablet 1  . TRESIBA FLEXTOUCH 100 UNIT/ML SOPN FlexTouch Pen 30 Units.     . Triamcinolone Acetonide (NASACORT ALLERGY 24HR NA) Place 1 spray into both nostrils daily as needed (for allergies).    . venlafaxine XR (EFFEXOR-XR) 75 MG 24 hr capsule Take 1 capsule (75 mg total) by mouth daily with breakfast. Take with 37.5 mg tab 90 capsule 1   No current facility-administered medications for this visit.    PHYSICAL EXAMINATION: ECOG PERFORMANCE STATUS: 1 - Symptomatic but completely ambulatory  Vitals:   09/02/20 1236  BP: (!) 142/78  Pulse: 82  Resp: 18  Temp: 97.9 F (36.6 C)  SpO2: 98%   Filed Weights   09/02/20 1236  Weight: 164 lb 6.4 oz (74.6 kg)    GENERAL:alert, no distress and comfortable SKIN: no rash  EYES: sclera clear LYMPH:  no palpable cervical or supraclavicular lymphadenopathy  LUNGS:  normal breathing effort ABDOMEN: Soft tissue prominence at Judith right anterior abdominal wall, no palpable mass or hepatomegaly  NEURO: alert & oriented x 3 with fluent speech, no focal motor/sensory deficits Breast exam: S/p right mastectomy, incision completely healed.  Scar tissue at Judith medial border.  Soft tissue prominences superior and inferior to Judith incision, slightly tender without discrete mass.  Subcentimeters nodularity at Judith right low axilla, best palpated supine with right arm raised.   Left breast and axilla benign  LABORATORY DATA:  I have reviewed Judith data as listed CBC Latest Ref Rng & Units 02/28/2020 01/13/2017 01/13/2017  WBC 4.0 - 10.5 K/uL 6.6 - 6.8  Hemoglobin 12.0 - 15.0 g/dL 13.8 14.3 12.8  Hematocrit 36.0 - 46.0 % 44.7 42.0 41.8  Platelets 150 - 400 K/uL 209 - 222     CMP Latest Ref Rng & Units 04/09/2020 02/28/2020 01/13/2017  Glucose 70 - 99 mg/dL 74 75 114(H)  BUN 8 - 23 mg/dL _0 Creatinine 0.44 - 1.00 mg/dL 0.61 0.73 0.40(L)  Sodium 135 - 145 mmol/L 140 143 141  Potassium 3.5 - 5.1 mmol/L 4.5 4.5 4.4  Chloride 98 - 111 mmol/L 107 107 106  CO2 22 - 32 mmol/L 22 26 -  Calcium 8.9 - 10.3 mg/dL 9.0 9.9 -  Total Protein  6.5 - 8.1 g/dL - 7.7 -  Total Bilirubin 0.3 - 1.2 mg/dL - 0.4 -  Alkaline Phos 38 - 126 U/L - 77 -  AST 15 - 41 U/L - 18 -  ALT 0 - 44 U/L - 13 -      RADIOGRAPHIC STUDIES: I have personally reviewed Judith radiological images as listed and agreed with Judith findings in Judith report. No results found.   ASSESSMENT & PLAN: Judith Chavez is a 75 y.o. female with    1.RightbreastDCIS,High Grade, ER /PR+, (+) LCIS -Diagnosed 02/2020 with right breast with DCIS and components of necrosis and LCIS.  -S/p right mastectomy by Dr Judith Chavez on 04/13/20. Given mastectomy, adjuvant radiation is not recommended.  -Given her strongly positive ER and PRand arthritis,history of hysterectomy, she started antiestrogen therapy with Tamoxifen for 5 years, with potential to decrease her risk of future breast cancer by ~40-50%. -survivorship visit 07/30/20 -continue surveillance and tamoxifen   2. Genetic Testing was negative for pathogenetic mutations except SDHA and HOXB13 c.251G>A variant only  3. Anxiety/Depression  -She has experienced depression and anxiety, on zoloft when she became our patient.  -Given Tamoxifen's drug interaction with Zoloft, she was switched to Effexor 75 mg po once daily  -she still has anxiety/depression, and now  moderate to severe hot flashes on tamoxifen,  -recommend to increase effexor, take 45m + 37.5 mg po once daily. If needed we can increase to 150 mg in Judith future.   4. Comorbidities: DM, Arthritis  -She has arthritis in her knees, shoulders and back. She has had knee and back surgery. She is able to remain active with routine activities but will have some pain.  -stable on tamoxifen, no new pain -Continue to F/u of PCP   Disposition: Ms. HMalanappears stable.  She continues tamoxifen, tolerating well with stable joint pain and moderate to severe hot flashes.  She is on Effexor 75 mg which is partially effective for hot flashes and mood, recommend to increase dose; she will continue 75 mg plus additional 37.5 mg p.o. once daily together.   Exam shows scar tissue and tender soft tissue densities along Judith right chest wall, and tiny nodularity in Judith right low axilla.  I suspect this is scar tissue/postsurgical changes.  She has been referred for right chest wall/axilla ultrasound to further evaluate, I will call her with Judith result.  If work-up is negative, she will return for routine lab and follow-up in 3 months.  Orders Placed This Encounter  Procedures  . UKoreaBREAST COMPLETE UNI RIGHT INC AXILLA    Standing Status:   Future    Standing Expiration Date:   09/02/2021    Order Specific Question:   Reason for Exam (SYMPTOM  OR DIAGNOSIS REQUIRED)    Answer:   s/p right mastectomy, evaluate tender right chest wall and small nodularity in right low axilla    Order Specific Question:   [facility]?    [facility]  All questions were answered. Judith patient knows to call Judith clinic with any problems, questions or concerns. No barriers to learning were detected. Total encounter time was 30 minutes.      LAlla Feeling NP 09/02/20

## 2020-09-03 ENCOUNTER — Telehealth: Payer: Self-pay | Admitting: Hematology

## 2020-09-03 NOTE — Telephone Encounter (Signed)
Scheduled follow-up appointment per 2/23 los. Patient is aware. 

## 2020-09-07 ENCOUNTER — Other Ambulatory Visit: Payer: Self-pay

## 2020-09-07 ENCOUNTER — Ambulatory Visit: Payer: Medicare Other | Attending: Surgery

## 2020-09-07 ENCOUNTER — Ambulatory Visit: Payer: Self-pay

## 2020-09-07 DIAGNOSIS — Z483 Aftercare following surgery for neoplasm: Secondary | ICD-10-CM | POA: Insufficient documentation

## 2020-09-07 NOTE — Therapy (Signed)
[place], [place], [address] Phone: [phone]   Fax:  [phone]  Physical Therapy Treatment  Patient Details  Name: Judith Chavez MRN: [id number] Date of Birth: January 23, 1946 Referring Provider (PT): Dr. Ninfa Linden   Encounter Date: 09/07/2020   PT End of Session - 09/07/20 1612    Visit Number 6   # unchanged due to screen only   Number of Visits 10    Date for PT Re-Evaluation 06/12/20    PT Start Time 3818    PT Stop Time 1612    PT Time Calculation (min) 5 min    Activity Tolerance Patient tolerated treatment well    Behavior During Therapy [facility] for tasks assessed/performed           Past Medical History:  Diagnosis Date  . Anxiety   . Arthritis    knees  . Cancer ([place]) 03/2020   right breast DCIS, LCIS  . Depression   . Diabetes mellitus without complication (HCC)    Type 2  . Family history of breast cancer 02/28/2020  . Headache    migraines  . History of pneumonia   . Numbness    "left foot"   . Poor circulation     Past Surgical History:  Procedure Laterality Date  . ABCESS DRAINAGE     "after cesarean- in hospital for 3 months"  . ABDOMINAL HYSTERECTOMY    . CESAREAN SECTION  1970  . CHOLECYSTECTOMY    . COLONOSCOPY    . EYE SURGERY Right   . MENISCUS REPAIR Bilateral   . SIMPLE MASTECTOMY WITH AXILLARY SENTINEL NODE BIOPSY Right 04/13/2020   Procedure: RIGHT MASTECTOMY WITH RIGHT AXILLARY SENTINEL NODE BIOPSY;  Surgeon: Coralie Keens, MD;  Location: [facility];  Service: General;  Laterality: Right;  . SPINE SURGERY      There were no vitals filed for this visit.   Subjective Assessment - 09/07/20 1608    Subjective Pt returns after wearing her compression sleeve daily for the past month to reassess her L-Dex score.    Pertinent History Rt breast cancer DCIS, LCIS, ER/PR positive s/p mastectomy with 0/3 nodes removed on 04/13/20 Dr. Ninfa Linden and  radiation status unknown.  . Other history includes anxiety, OA, hysterectomy, DM                  L-DEX FLOWSHEETS - 09/07/20 1600      L-DEX LYMPHEDEMA SCREENING   Measurement Type Unilateral    L-DEX MEASUREMENT EXTREMITY Upper Extremity    POSITION  Standing    DOMINANT SIDE Right    At Risk Side Right    BASELINE SCORE (UNILATERAL) -4.4    L-DEX SCORE (UNILATERAL) -3.2    VALUE CHANGE (UNILAT) 1.2                                  PT Long Term Goals - 06/09/20 1401      PT LONG TERM GOAL #1   Title Pt will be given risk reduction paper and education on lymphedema    Status Achieved      PT LONG TERM GOAL #2   Title pt will return Rt shoulder to baseline measurements    Status On-going      PT LONG TERM GOAL #3   Title pt will decrease QDASH to less than 25%    Status On-going  Plan - 09/07/20 1613    Clinical Impression Statement Pt returns 1 month later after having scored higher than a 6.5 from her baseline which indicated subclinical lymphedema. Her change from baseline had reduced greatly to 1.2 showing she reversed her subclinical lymphedema. Pt has been wearing her compression sleeve daily and reports ordered 2 more and has also began swimming 2x/wk for exercise. She was very pleased with results today, requires no further treatment at this time except to resume every 3 month L-Dex screens which she is agreeable to.    PT Next Visit Plan Pt to resume every 3 month L-Dex screens.    Consulted and Agree with Plan of Care Patient           Patient will benefit from skilled therapeutic intervention in order to improve the following deficits and impairments:     Visit Diagnosis: Aftercare following surgery for neoplasm     Problem List Patient Active Problem List   Diagnosis Date Noted  . History of right breast cancer 04/13/2020  . Genetic testing 03/10/2020  . Family history of breast cancer  02/28/2020  . Malignant neoplasm of upper-outer quadrant of right breast in female, estrogen receptor positive ([place]) 02/26/2020  . Primary osteoarthritis of right knee 03/20/2019  . Primary osteoarthritis of left knee 03/20/2019  . Primary osteoarthritis of both knees 04/24/2017  . Spondylolisthesis at L3-L4 level 06/01/2015    Otelia Limes, PTA 09/07/2020, 4:42 PM  [place], [place], [address] Phone: [phone]   Fax:  [phone]  Name: Judith Chavez MRN: [id number] Date of Birth: April 30, 1946

## 2020-09-19 DIAGNOSIS — R109 Unspecified abdominal pain: Secondary | ICD-10-CM | POA: Diagnosis not present

## 2020-09-19 DIAGNOSIS — R1032 Left lower quadrant pain: Secondary | ICD-10-CM | POA: Diagnosis not present

## 2020-09-19 DIAGNOSIS — M79605 Pain in left leg: Secondary | ICD-10-CM | POA: Diagnosis not present

## 2020-10-06 ENCOUNTER — Other Ambulatory Visit: Payer: Medicare Other

## 2020-10-07 ENCOUNTER — Encounter: Payer: Medicare Other | Admitting: Surgical

## 2020-10-12 DIAGNOSIS — E1169 Type 2 diabetes mellitus with other specified complication: Secondary | ICD-10-CM | POA: Diagnosis not present

## 2020-10-12 DIAGNOSIS — C50919 Malignant neoplasm of unspecified site of unspecified female breast: Secondary | ICD-10-CM | POA: Diagnosis not present

## 2020-10-12 DIAGNOSIS — R232 Flushing: Secondary | ICD-10-CM | POA: Diagnosis not present

## 2020-10-12 DIAGNOSIS — F322 Major depressive disorder, single episode, severe without psychotic features: Secondary | ICD-10-CM | POA: Diagnosis not present

## 2020-10-22 ENCOUNTER — Encounter: Payer: Medicare Other | Admitting: Plastic Surgery

## 2020-10-28 ENCOUNTER — Other Ambulatory Visit: Payer: Medicare Other

## 2020-10-28 ENCOUNTER — Ambulatory Visit: Payer: Medicare Other | Admitting: Hematology

## 2020-11-20 ENCOUNTER — Telehealth: Payer: Self-pay | Admitting: Orthopaedic Surgery

## 2020-11-20 NOTE — Telephone Encounter (Signed)
Sure thing!

## 2020-11-20 NOTE — Telephone Encounter (Signed)
See message.

## 2020-11-20 NOTE — Telephone Encounter (Signed)
Pt called and said she needs Dr.Xu urgently. She was wondering if she could get worked in first thing Tuesday. Its concerning her knee.  Cb [phone]

## 2020-11-20 NOTE — Telephone Encounter (Signed)
I called patient. She Couldn't come in Tuesday she would like to come in Wednesday. Appt made.

## 2020-11-25 ENCOUNTER — Encounter: Payer: Self-pay | Admitting: Orthopaedic Surgery

## 2020-11-25 ENCOUNTER — Ambulatory Visit (INDEPENDENT_AMBULATORY_CARE_PROVIDER_SITE_OTHER): Payer: Medicare Other | Admitting: Orthopaedic Surgery

## 2020-11-25 ENCOUNTER — Telehealth: Payer: Self-pay

## 2020-11-25 ENCOUNTER — Other Ambulatory Visit: Payer: Self-pay

## 2020-11-25 ENCOUNTER — Ambulatory Visit (INDEPENDENT_AMBULATORY_CARE_PROVIDER_SITE_OTHER): Payer: Medicare Other

## 2020-11-25 DIAGNOSIS — M1712 Unilateral primary osteoarthritis, left knee: Secondary | ICD-10-CM | POA: Diagnosis not present

## 2020-11-25 DIAGNOSIS — R7303 Prediabetes: Secondary | ICD-10-CM

## 2020-11-25 DIAGNOSIS — M1711 Unilateral primary osteoarthritis, right knee: Secondary | ICD-10-CM | POA: Diagnosis not present

## 2020-11-25 NOTE — Progress Notes (Signed)
Office Visit Note   Patient: Judith Chavez           Date of Birth: 1945/10/17           MRN: [id number] Visit Date: 11/25/2020              Requested by: Mayra Neer, MD [address] [place],  [place] [address] PCP: Mayra Neer, MD   Assessment & Plan: Visit Diagnoses:  1. Primary osteoarthritis of right knee   2. Primary osteoarthritis of left knee   3. Prediabetes      Plan: We had a long discussion today on treatment options going forward.  So far what we have tried from a conservative standpoint has not helped.  She continues to have severe pain with daily activities.  Based on her options and after much consideration she has decided that she would like to proceed with a right total knee replacement as long as her A1c level is less than 7.8.  A1c level was drawn today.  For the left knee she would like to try Visco injection which we will get approval for.  We will follow-up with the patient in the near future regarding the A1c level.  Follow-Up Instructions: No follow-ups on file.   Orders:  Orders Placed This Encounter  Procedures  . XR KNEE 3 VIEW RIGHT  . HgB A1c   No orders of the defined types were placed in this encounter.     Procedures: No procedures performed   Clinical Data: No additional findings.   Subjective: Chief Complaint  Patient presents with  . Right Knee - Pain    Zane is a 75 year old female that I have been seeing for quite some time who comes in for chronic severe right knee pain due to DJD.  She had a cortisone injection in November 2021 which helped few days but nothing significant or worse doing another injection 4.  Last week she had a lot of pain and difficulty with the right leg.  She had swelling which has fortunately mostly resolved.  She is having pretty much severe pain with all active daily activities.  She has chronic nighttime pain as well.   Review of Systems  Constitutional: Negative.   HENT:  Negative.   Eyes: Negative.   Respiratory: Negative.   Cardiovascular: Negative.   Endocrine: Negative.   Musculoskeletal: Negative.   Neurological: Negative.   Hematological: Negative.   Psychiatric/Behavioral: Negative.   All other systems reviewed and are negative.    Objective: Vital Signs: There were no vitals taken for this visit.  Physical Exam Vitals and nursing note reviewed.  Constitutional:      Appearance: She is well-developed.  Pulmonary:     Effort: Pulmonary effort is normal.  Skin:    General: Skin is warm.     Capillary Refill: Capillary refill takes less than 2 seconds.  Neurological:     Mental Status: She is alert and oriented to person, place, and time.  Psychiatric:        Behavior: Behavior normal.        Thought Content: Thought content normal.        Judgment: Judgment normal.     Ortho Exam  Right knee shows mild valgus deformity.  Significant pain and crepitus with range of motion which is moderately restricted.  Small joint effusion. Specialty Comments:  No specialty comments available.  Imaging: XR KNEE 3 VIEW RIGHT  Result Date: 11/25/2020 Advanced tricompartmental DJD  with valgus deformity.    PMFS History: Patient Active Problem List   Diagnosis Date Noted  . History of right breast cancer 04/13/2020  . Genetic testing 03/10/2020  . Family history of breast cancer 02/28/2020  . Malignant neoplasm of upper-outer quadrant of right breast in female, estrogen receptor positive ([place]) 02/26/2020  . Primary osteoarthritis of right knee 03/20/2019  . Primary osteoarthritis of left knee 03/20/2019  . Primary osteoarthritis of both knees 04/24/2017  . Spondylolisthesis at L3-L4 level 06/01/2015   Past Medical History:  Diagnosis Date  . Anxiety   . Arthritis    knees  . Cancer ([place]) 03/2020   right breast DCIS, LCIS  . Depression   . Diabetes mellitus without complication (HCC)    Type 2  . Family history of breast cancer  02/28/2020  . Headache    migraines  . History of pneumonia   . Numbness    "left foot"   . Poor circulation     Family History  Problem Relation Age of Onset  . Breast cancer Cousin        maternal; dx and d. <50  . Breast cancer Cousin        maternal; dx and d. <50  . Breast cancer Cousin        maternal; dx and d. <50  . Breast cancer Cousin        maternal; dx and d. <50  . Cancer Paternal Aunt        unknown type cancer dx 65s    Past Surgical History:  Procedure Laterality Date  . ABCESS DRAINAGE     "after cesarean- in hospital for 3 months"  . ABDOMINAL HYSTERECTOMY    . CESAREAN SECTION  1970  . CHOLECYSTECTOMY    . COLONOSCOPY    . EYE SURGERY Right   . MENISCUS REPAIR Bilateral   . SIMPLE MASTECTOMY WITH AXILLARY SENTINEL NODE BIOPSY Right 04/13/2020   Procedure: RIGHT MASTECTOMY WITH RIGHT AXILLARY SENTINEL NODE BIOPSY;  Surgeon: Coralie Keens, MD;  Location: [facility];  Service: General;  Laterality: Right;  . SPINE SURGERY     Social History   Occupational History  . Occupation: retired   Tobacco Use  . Smoking status: Former Smoker    Packs/day: 0.50    Years: 10.00    Pack years: 5.00    Quit date: 07/11/1986    Years since quitting: 34.4  . Smokeless tobacco: Never Used  Substance and Sexual Activity  . Alcohol use: Yes    Comment: occassionally  . Drug use: No  . Sexual activity: Not on file

## 2020-11-25 NOTE — Telephone Encounter (Signed)
Please submit foe left knee gel inj

## 2020-11-26 LAB — HEMOGLOBIN A1C
Hgb A1c MFr Bld: 7.3 % of total Hgb — ABNORMAL HIGH (ref <5.7)
Mean Plasma Glucose: 163 mg/dL
eAG (mmol/L): 9 mmol/L

## 2020-11-27 ENCOUNTER — Telehealth: Payer: Self-pay | Admitting: Hematology

## 2020-11-27 NOTE — Telephone Encounter (Signed)
Rescheduled upcoming appointment per provider's request. Patient is aware of changes. 

## 2020-11-27 NOTE — Telephone Encounter (Signed)
Noted  

## 2020-11-30 ENCOUNTER — Telehealth: Payer: Self-pay

## 2020-11-30 NOTE — Telephone Encounter (Signed)
Pt called and would like to discuss surgery dates

## 2020-12-02 NOTE — Progress Notes (Signed)
[place]   [phone] [phone]   Clinic Follow up Note   Patient Care Team: Mayra Neer, MD as PCP - General (Family Medicine) Coralie Keens, MD as Consulting Physician (General Surgery) Truitt Merle, MD as Consulting Physician (Hematology) Gery Pray, MD as Consulting Physician (Radiation Oncology) Mauro Kaufmann, RN as Oncology Nurse Navigator Rockwell Germany, RN as Oncology Nurse Navigator Alla Feeling, NP as Nurse Practitioner (Nurse Practitioner)  Date of Service:  12/03/2020  CHIEF COMPLAINT:  F/u of right breast LCIS  SUMMARY OF ONCOLOGIC HISTORY: Oncology History Overview Note  Cancer Staging Malignant neoplasm of upper-outer quadrant of right breast in female, estrogen receptor positive ([place]) Staging form: Breast, AJCC 8th Edition - Clinical stage from 02/17/2020: Stage 0 (cTis (DCIS), cN0, cM0, ER+, PR+) - Signed by Gardenia Phlegm, NP on 02/26/2020 - Pathologic stage from 04/13/2020: Stage 0 (pTis (DCIS), pN0, cM0, G3, ER+, PR+) - Signed by Alla Feeling, NP on 07/30/2020    Malignant neoplasm of upper-outer quadrant of right breast in female, estrogen receptor positive ([place])  02/13/2020 Mammogram   IMPRESSION: Highly suspicious calcifications within the upper-outer quadrant of the RIGHT breast, measuring 2.8 cm extent, for which stereotactic biopsy is recommended.   02/17/2020 Cancer Staging   Staging form: Breast, AJCC 8th Edition - Clinical stage from 02/17/2020: Stage 0 (cTis (DCIS), cN0, cM0, ER+, PR+) - Signed by Gardenia Phlegm, NP on 02/26/2020   02/17/2020 Initial Biopsy   Diagnosis Breast, right, needle core biopsy, lateral - DUCTAL CARCINOMA IN SITU, HIGH-GRADE WITH NECROSIS AND CALCIFICATIONS. SEE NOTE - LOBULAR CARCINOMA IN SITU Diagnosis Note DCIS measures 0.5 cm in greatest linear dimension. Dr. Saralyn Pilar reviewed the case and concurs with the diagnosis. A breast prognostic profile (ER,  PR) is pending and will be reported in an addendum. [facility] was notified on 02/18/2020.   02/17/2020 Receptors her2   PROGNOSTIC INDICATORS Results: IMMUNOHISTOCHEMICAL AND MORPHOMETRIC ANALYSIS PERFORMED MANUALLY Estrogen Receptor: 95%, POSITIVE, MODERATE STAINING INTENSITY Progesterone Receptor: 70%, POSITIVE, MODERATE STAINING INTENSITY   02/26/2020 Initial Diagnosis   Malignant neoplasm of upper-outer quadrant of right breast in female, estrogen receptor positive ([place])   03/08/2020 Genetic Testing   No pathogenic variants detected in [facility] Common Hereditary Cancers Panel. The Common Hereditary Cancers Panel offered by [facility] includes sequencing and/or deletion duplication testing of the following 48 genes: APC, ATM, AXIN2, BARD1, BMPR1A, BRCA1, BRCA2, BRIP1, CDH1, CDK4, CDKN2A (p14ARF), CDKN2A (p16INK4a), CHEK2, CTNNA1, DICER1, EPCAM (Deletion/duplication testing only), GREM1 (promoter region deletion/duplication testing only), KIT, MEN1, MLH1, MSH2, MSH3, MSH6, MUTYH, NBN, NF1, NHTL1, PALB2, PDGFRA, PMS2, POLD1, POLE, PTEN, RAD50, RAD51C, RAD51D, RNF43, SDHB, SDHC, SDHD, SMAD4, SMARCA4. STK11, TP53, TSC1, TSC2, and VHL.  The following genes were evaluated for sequence changes only: SDHA and HOXB13 c.251G>A variant only. The report date is March 08, 2020.   03/08/2020 Breast MRI   IMPRESSION: 1. Suspicious, linear enhancement involving the upper-outer quadrant of the right breast from mid to posterior depth. Susceptibility artifact from post-biopsy clip is seen along the posterior, inferior margin. The overall suspicious finding span approximately 4.2 x 2.8 cm in the AP by transverse dimensions. Recommendation is for MRI guided biopsy along the anterior margin of this enhancement, particularly at the irregular focus identified on series 10, image 81/144. 2. No suspicious MRI findings in the remainder of the right breast. 3. No MRI evidence of malignancy on  the left. 4. No suspicious lymphadenopathy.   03/20/2020  Pathology Results   Diagnosis Breast, right, needle core biopsy, outer - LOBULAR CARCINOMA IN SITU. Microscopic Comment Immunohistochemistry for E-cadherin is negative. P63, Calponin and SMM-1 demonstrate the presence of myoepithelium. CKAE1AE3 is negative for an occult process. Results reported to [facility] on 03/23/2020. Dr. Melina Copa reviewed.   04/13/2020 Surgery   RIGHT MASTECTOMY WITH RIGHT AXILLARY SENTINEL NODE BIOPSY by Dr Ninfa Linden   04/13/2020 Pathology Results   FINAL MICROSCOPIC DIAGNOSIS:   A. BREAST, RIGHT, MASTECTOMY:  - Ductal carcinoma in situ (DCIS), high-grade with focal necrosis and  calcifications, spanning an area of 4.2 cm  - Lobular carcinoma in situ  - Resection margins are negative for DCIS  - Biopsy site changes  - See oncology table   B. LYMPH NODE, RIGHT AXILLARY, SENTINEL, EXCISION:  - Lymph node, negative for carcinoma (0/1)   C. LYMPH NODE, RIGHT AXILLARY, SENTINEL, EXCISION:  - Lymph node, negative for carcinoma (0/1)   D. LYMPH NODE, RIGHT AXILLARY, SENTINEL, EXCISION:  - Lymph node, negative for carcinoma (0/1)    04/13/2020 Cancer Staging   Staging form: Breast, AJCC 8th Edition - Pathologic stage from 04/13/2020: Stage 0 (pTis (DCIS), pN0, cM0, G3, ER+, PR+) - Signed by Alla Feeling, NP on 07/30/2020   05/29/2020 -  Anti-estrogen oral therapy   Tamoxifen 43m once daily    07/30/2020 Survivorship   SCP delivered by LCira Rue NP       CURRENT THERAPY:  Tamoxifen 262monce daily starting 05/2020, will hold from 12/04/2020 due to knee replacement surgery, and switch to raloxifen  after surgery  INTERVAL HISTORY:  GlTeyah Rossys here for a follow up of right breast LCIS. She was last seen by me 7 months ago and seen by NP Lacie in interim. She presents to the clinic alone. R knee pain is much worse lately, she is scheduled for knee replacement in mid July   No other joint pain  Still has hot flush, overall better, still has clear vaginal discharge, no vaginal bleeding. She gained about 5 lbs in past 6 months  All other systems were reviewed with the patient and are negative.  MEDICAL HISTORY:  Past Medical History:  Diagnosis Date  . Anxiety   . Arthritis    knees  . [place]   right breast DCIS, LCIS  . Depression   . Diabetes mellitus without complication (HCC)    Type 2  . Family history of breast cancer 02/28/2020  . Headache    migraines  . History of pneumonia   . Numbness    "left foot"   . Poor circulation     SURGICAL HISTORY: Past Surgical History:  Procedure Laterality Date  . ABCESS DRAINAGE     "after cesarean- in hospital for 3 months"  . ABDOMINAL HYSTERECTOMY    . CESAREAN SECTION  1970  . CHOLECYSTECTOMY    . COLONOSCOPY    . EYE SURGERY Right   . MENISCUS REPAIR Bilateral   . SIMPLE MASTECTOMY WITH AXILLARY SENTINEL NODE BIOPSY Right 04/13/2020   Procedure: RIGHT MASTECTOMY WITH RIGHT AXILLARY SENTINEL NODE BIOPSY;  Surgeon: BlCoralie KeensMD;  [place] [facility] Service: General;  Laterality: Right;  . SPINE SURGERY      I have reviewed the social history and family history with the patient and they are unchanged from previous note.  ALLERGIES:  has No Known Allergies.  MEDICATIONS:  Current Outpatient Medications  Medication Sig Dispense Refill  .  ALPRAZolam (XANAX) 0.5 MG tablet Take 0.5 mg by mouth 2 (two) times daily as needed.    . calcium-vitamin D 250-100 MG-UNIT tablet Take 1 tablet by mouth daily.    . Cholecalciferol (VITAMIN D-3 PO) Take 1 tablet by mouth daily.    Marland Kitchen NOVOFINE 32G X 6 MM MISC AS DIRECTED ONCE A DAY WITH TRESIBA FLEXTOUCH    . ONETOUCH DELICA LANCETS 10O MISC 2 (two) times daily. for testing  2  . ONETOUCH VERIO test strip USE AS DIRECTED TO TEST BLOOD SUGAR TWICE A DAY  3  . sitaGLIPtin (JANUVIA) 100 MG tablet Take 100 mg by mouth  daily.    . tamoxifen (NOLVADEX) 20 MG tablet Take 1 tablet (20 mg total) by mouth daily. 90 tablet 1  . traMADol (ULTRAM) 50 MG tablet Take 1 tablet (50 mg total) by mouth every 6 (six) hours as needed for moderate pain or severe pain. 25 tablet 1  . TRESIBA FLEXTOUCH 100 UNIT/ML SOPN FlexTouch Pen 30 Units.     . Triamcinolone Acetonide (NASACORT ALLERGY 24HR NA) Place 1 spray into both nostrils daily as needed (for allergies).    . venlafaxine XR (EFFEXOR-XR) 37.5 MG 24 hr capsule Take 1 capsule (37.5 mg total) by mouth daily with breakfast. Take with 75 mg tab 90 capsule 1  . venlafaxine XR (EFFEXOR-XR) 75 MG 24 hr capsule Take 1 capsule (75 mg total) by mouth daily with breakfast. Take with 37.5 mg tab 90 capsule 1   No current facility-administered medications for this visit.    PHYSICAL EXAMINATION: ECOG PERFORMANCE STATUS: 2 - Symptomatic, <50% confined to bed  Vitals:   12/03/20 1319  BP: 136/87  Pulse: 70  Resp: 17  Temp: 97.6 F (36.4 C)  SpO2: 99%   Filed Weights   12/03/20 1319  Weight: 164 lb 14.4 oz (74.8 kg)    GENERAL:alert, no distress and comfortable SKIN: skin color, texture, turgor are normal, no rashes or significant lesions EYES: normal, Conjunctiva are pink and non-injected, sclera clear NECK: supple, thyroid normal size, non-tender, without nodularity LYMPH:  no palpable lymphadenopathy in the cervical, axillary  LUNGS: clear to auscultation and percussion with normal breathing effort HEART: regular rate & rhythm and no murmurs and no lower extremity edema ABDOMEN:abdomen soft, non-tender and normal bowel sounds Musculoskeletal:no cyanosis of digits and no clubbing  NEURO: alert & oriented x 3 with fluent speech, no focal motor/sensory deficits Breasts: Status post right mastectomy, moderate scar tissue, no palpable nodules at incision site.  Palpation of the left breast and axilla revealed no obvious mass that I could appreciate.   LABORATORY DATA:   I have reviewed the data as listed CBC Latest Ref Rng & Units 12/03/2020 02/28/2020 01/13/2017  WBC 4.0 - 10.5 K/uL 6.5 6.6 -  Hemoglobin 12.0 - 15.0 g/dL 13.2 13.8 14.3  Hematocrit 36.0 - 46.0 % 41.9 44.7 42.0  Platelets 150 - 400 K/uL 208 209 -     CMP Latest Ref Rng & Units 12/03/2020 04/09/2020 02/28/2020  Glucose 70 - 99 mg/dL 186(H) 74 75  BUN 8 - 23 mg/dL _0 Creatinine 0.44 - 1.00 mg/dL 0.74 0.61 0.73  Sodium 135 - 145 mmol/L 143 140 143  Potassium 3.5 - 5.1 mmol/L 3.9 4.5 4.5  Chloride 98 - 111 mmol/L 108 107 107  CO2 22 - 32 mmol/L _1 Calcium 8.9 - 10.3 mg/dL 9.1 9.0 9.9  Total Protein 6.5 - 8.1 g/dL 7.6 -  7.7  Total Bilirubin 0.3 - 1.2 mg/dL 0.3 - 0.4  Alkaline Phos 38 - 126 U/L 68 - 77  AST 15 - 41 U/L 20 - 18  ALT 0 - 44 U/L 13 - 13      RADIOGRAPHIC STUDIES: I have personally reviewed the radiological images as listed and agreed with the findings in the report. No results found.   ASSESSMENT & PLAN:  Judith Chavez is a 75 y.o. female with   1.RightbreastDCIS,High Grade, ER /PR+, (+) LCIS -She was diagnosed in 02/2020 with 2.8cm mass in the right breast with DCIS and components of necrosis and LCIS. There was no invasive cancer on either biopsy.  -She underwent right mastectomy by Dr Ninfa Linden on 04/13/20. Path showed right breast 4.2cm DCIS and components of LCIS was completley removed, negative margins and LN negative.  -Her DCIS and LCIS was cured by complete surgical resection. Any form of adjuvant therapy is preventivegiven her high risk of future breast cancer. -I started her on Antiestrogen therapy with Tamoxifen 75m once daily in 05/2020.  She is tolerating moderately well, with hot flashes and clear vaginal discharge. -Due to her pending knee replacement surgery, I will hold tamoxifen for now. -We discussed switching tamoxifen to Raloxifen, benefit and side effects discussed with her, she is interested.  -I will Chavez her in 3 months to see how  she recovers from the surgery, and plan to Chavez in Raloxifen then    2. Genetic Testing was negative for pathogenetic mutations except SDHA and HOXB13 c.251G>A variant only   3. Anxiety/Depression  -She has experienced depression and anxiety. She is on Zoloft.  -Given Tamoxifen's drug interaction with Zoloft, she was switched to Effexor (05/04/20).  -Her anxiety has impacted her sleep. I recommend OTC melatonin or Benadryl to help  4. Comorbidities: DM, Arthritis  -She has arthritis in her knees, shoulders and back. She has had knee and back surgery. She is able to remain active with routine activities but will have some pain.  -Continue to F/u of PCP   PLAN: -We will stop tamoxifen due to pending right knee replacement surgery in July -I will Chavez her in 3 months to see how she recovers from the surgery, and plan to Chavez in Raloxifen then  -Lab and office visit in 6 months   No problem-specific Assessment & Plan notes found for this encounter.   No orders of the defined types were placed in this encounter.  All questions were answered. The patient knows to Chavez the clinic with any problems, questions or concerns. No barriers to learning was detected. The total time spent in the appointment was 30 minutes.     YTruitt Merle MD 12/03/2020   I, AJoslyn Devon am acting as scribe for YTruitt Merle MD.   I have reviewed the above documentation for accuracy and completeness, and I agree with the above.

## 2020-12-03 ENCOUNTER — Other Ambulatory Visit: Payer: Self-pay

## 2020-12-03 ENCOUNTER — Encounter: Payer: Self-pay | Admitting: Hematology

## 2020-12-03 ENCOUNTER — Inpatient Hospital Stay: Payer: Medicare Other | Attending: Hematology

## 2020-12-03 ENCOUNTER — Inpatient Hospital Stay (HOSPITAL_BASED_OUTPATIENT_CLINIC_OR_DEPARTMENT_OTHER): Payer: Medicare Other | Admitting: Hematology

## 2020-12-03 ENCOUNTER — Telehealth: Payer: Self-pay | Admitting: Hematology

## 2020-12-03 VITALS — BP 136/87 | HR 70 | Temp 97.6°F | Resp 17 | Ht 61.0 in | Wt 164.9 lb

## 2020-12-03 DIAGNOSIS — F419 Anxiety disorder, unspecified: Secondary | ICD-10-CM | POA: Diagnosis not present

## 2020-12-03 DIAGNOSIS — Z9011 Acquired absence of right breast and nipple: Secondary | ICD-10-CM | POA: Insufficient documentation

## 2020-12-03 DIAGNOSIS — Z803 Family history of malignant neoplasm of breast: Secondary | ICD-10-CM | POA: Diagnosis not present

## 2020-12-03 DIAGNOSIS — Z170 Estrogen receptor positive status [ER+]: Secondary | ICD-10-CM | POA: Insufficient documentation

## 2020-12-03 DIAGNOSIS — F32A Depression, unspecified: Secondary | ICD-10-CM | POA: Insufficient documentation

## 2020-12-03 DIAGNOSIS — C50411 Malignant neoplasm of upper-outer quadrant of right female breast: Secondary | ICD-10-CM

## 2020-12-03 DIAGNOSIS — D0511 Intraductal carcinoma in situ of right breast: Secondary | ICD-10-CM | POA: Diagnosis not present

## 2020-12-03 DIAGNOSIS — E119 Type 2 diabetes mellitus without complications: Secondary | ICD-10-CM | POA: Diagnosis not present

## 2020-12-03 DIAGNOSIS — D0501 Lobular carcinoma in situ of right breast: Secondary | ICD-10-CM | POA: Diagnosis not present

## 2020-12-03 LAB — CBC WITH DIFFERENTIAL (CANCER CENTER ONLY)
Abs Immature Granulocytes: 0.03 10*3/uL (ref 0.00–0.07)
Basophils Absolute: 0.1 10*3/uL (ref 0.0–0.1)
Basophils Relative: 1 %
Eosinophils Absolute: 0.1 10*3/uL (ref 0.0–0.5)
Eosinophils Relative: 2 %
HCT: 41.9 % (ref 36.0–46.0)
Hemoglobin: 13.2 g/dL (ref 12.0–15.0)
Immature Granulocytes: 1 %
Lymphocytes Relative: 39 %
Lymphs Abs: 2.6 10*3/uL (ref 0.7–4.0)
MCH: 27.1 pg (ref 26.0–34.0)
MCHC: 31.5 g/dL (ref 30.0–36.0)
MCV: 86 fL (ref 80.0–100.0)
Monocytes Absolute: 0.3 10*3/uL (ref 0.1–1.0)
Monocytes Relative: 5 %
Neutro Abs: 3.4 10*3/uL (ref 1.7–7.7)
Neutrophils Relative %: 52 %
Platelet Count: 208 10*3/uL (ref 150–400)
RBC: 4.87 MIL/uL (ref 3.87–5.11)
RDW: 13.2 % (ref 11.5–15.5)
WBC Count: 6.5 10*3/uL (ref 4.0–10.5)
nRBC: 0 % (ref 0.0–0.2)

## 2020-12-03 LAB — CMP (CANCER CENTER ONLY)
ALT: 13 U/L (ref 0–44)
AST: 20 U/L (ref 15–41)
Albumin: 3.9 g/dL (ref 3.5–5.0)
Alkaline Phosphatase: 68 U/L (ref 38–126)
Anion gap: 9 (ref 5–15)
BUN: 12 mg/dL (ref 8–23)
CO2: 26 mmol/L (ref 22–32)
Calcium: 9.1 mg/dL (ref 8.9–10.3)
Chloride: 108 mmol/L (ref 98–111)
Creatinine: 0.74 mg/dL (ref 0.44–1.00)
GFR, Estimated: >60 mL/min (ref >60)
Glucose, Bld: 186 mg/dL — ABNORMAL HIGH (ref 70–99)
Potassium: 3.9 mmol/L (ref 3.5–5.1)
Sodium: 143 mmol/L (ref 135–145)
Total Bilirubin: 0.3 mg/dL (ref 0.3–1.2)
Total Protein: 7.6 g/dL (ref 6.5–8.1)

## 2020-12-03 NOTE — Telephone Encounter (Signed)
Scheduled follow-up appointments per 5/26 los. Patient is aware.

## 2020-12-04 ENCOUNTER — Ambulatory Visit: Payer: Medicare Other | Admitting: Hematology

## 2020-12-04 ENCOUNTER — Other Ambulatory Visit: Payer: Medicare Other

## 2020-12-08 ENCOUNTER — Encounter: Payer: Self-pay | Admitting: Hematology

## 2020-12-21 ENCOUNTER — Telehealth: Payer: Self-pay

## 2020-12-21 ENCOUNTER — Other Ambulatory Visit: Payer: Self-pay

## 2020-12-21 ENCOUNTER — Ambulatory Visit: Payer: Medicare Other | Attending: Surgery | Admitting: Physical Therapy

## 2020-12-21 DIAGNOSIS — Z483 Aftercare following surgery for neoplasm: Secondary | ICD-10-CM | POA: Insufficient documentation

## 2020-12-21 NOTE — Telephone Encounter (Signed)
VOB has been submitted for SynviscOne, left knee. Pending BV. 

## 2020-12-21 NOTE — Therapy (Signed)
[place], [place], [address] Phone: [phone]   Fax:  [phone]  Physical Therapy Treatment  Patient Details  Name: Judith Chavez MRN: [id number] Date of Birth: 1945-10-19 Referring Provider (PT): Dr. Ninfa Linden   Encounter Date: 12/21/2020   PT End of Session - 12/21/20 1639     Visit Number 6   unchanged due to screen   PT Start Time 1640    PT Stop Time 1646    PT Time Calculation (min) 6 min    Behavior During Therapy [facility] for tasks assessed/performed             Past Medical History:  Diagnosis Date   Anxiety    Arthritis    knees   Cancer ([place]) 03/2020   right breast DCIS, LCIS   Depression    Diabetes mellitus without complication ([place])    Type 2   Family history of breast cancer 02/28/2020   Headache    migraines   History of pneumonia    Numbness    "left foot"    Poor circulation     Past Surgical History:  Procedure Laterality Date   ABCESS DRAINAGE     "after cesarean- in hospital for 3 months"   [facility] Right    MENISCUS REPAIR Bilateral    SIMPLE MASTECTOMY WITH AXILLARY SENTINEL NODE BIOPSY Right 04/13/2020   Procedure: RIGHT MASTECTOMY WITH RIGHT AXILLARY SENTINEL NODE BIOPSY;  Surgeon: Coralie Keens, MD;  Location: [facility];  Service: General;  Laterality: Right;   SPINE SURGERY      There were no vitals filed for this visit.   Subjective Assessment - 12/21/20 1639     Subjective Pt is here for her SOZO screen    Pertinent History Rt breast cancer DCIS, LCIS, ER/PR positive s/p mastectomy with 0/3 nodes removed on 04/13/20 Dr. Ninfa Linden and radiation status unknown.  . Other history includes anxiety, OA, hysterectomy, DM                    L-DEX FLOWSHEETS - 12/21/20 1600       L-DEX LYMPHEDEMA SCREENING   Measurement Type  Unilateral    L-DEX MEASUREMENT EXTREMITY Upper Extremity    POSITION  Standing    DOMINANT SIDE Right    At Risk Side Right    BASELINE SCORE (UNILATERAL) -4.4    L-DEX SCORE (UNILATERAL) -4.1                                    PT Long Term Goals - 06/09/20 1401       PT LONG TERM GOAL #1   Title Pt will be given risk reduction paper and education on lymphedema    Status Achieved      PT LONG TERM GOAL #2   Title pt will return Rt shoulder to baseline measurements    Status On-going      PT LONG TERM GOAL #3   Title pt will decrease QDASH to less than 25%    Status On-going                   Plan - 12/21/20 1646     Clinical Impression Statement pt comes in for 3 month  SOZO check.  Her change from baseline is 0.3 so is in the recommended range.  Continued pt to continue with exercise as she has been    PT Next Visit Plan Pt to resume every 3 month L-Dex screens.             Patient will benefit from skilled therapeutic intervention in order to improve the following deficits and impairments:     Visit Diagnosis: Aftercare following surgery for neoplasm     Problem List Patient Active Problem List   Diagnosis Date Noted   History of right breast cancer 04/13/2020   Genetic testing 03/10/2020   Family history of breast cancer 02/28/2020   Malignant neoplasm of upper-outer quadrant of right breast in female, estrogen receptor positive ([place]) 02/26/2020   Primary osteoarthritis of right knee 03/20/2019   Primary osteoarthritis of left knee 03/20/2019   Primary osteoarthritis of both knees 04/24/2017   Spondylolisthesis at L3-L4 level 06/01/2015   Donato Heinz. Owens Shark PT  Norwood Levo 12/21/2020, 4:48 PM  [place], [place], [address] Phone: [phone]   Fax:  [phone]  Name: Judith Chavez MRN: [id number] Date of Birth: [birth year]

## 2020-12-22 ENCOUNTER — Ambulatory Visit (INDEPENDENT_AMBULATORY_CARE_PROVIDER_SITE_OTHER): Payer: Medicare Other | Admitting: Orthopaedic Surgery

## 2020-12-22 ENCOUNTER — Encounter: Payer: Self-pay | Admitting: Orthopaedic Surgery

## 2020-12-22 ENCOUNTER — Telehealth: Payer: Self-pay

## 2020-12-22 DIAGNOSIS — M1712 Unilateral primary osteoarthritis, left knee: Secondary | ICD-10-CM

## 2020-12-22 MED ORDER — BUPIVACAINE HCL 0.25 % IJ SOLN
2.0000 mL | INTRAMUSCULAR | Status: AC | PRN
  Administered 2020-12-22: 2 mL via INTRA_ARTICULAR

## 2020-12-22 MED ORDER — LIDOCAINE HCL 1 % IJ SOLN
2.0000 mL | INTRAMUSCULAR | Status: AC | PRN
  Administered 2020-12-22: 2 mL

## 2020-12-22 MED ORDER — HYLAN G-F 20 48 MG/6ML IX SOSY
48.0000 mg | PREFILLED_SYRINGE | INTRA_ARTICULAR | Status: AC | PRN
  Administered 2020-12-22: 48 mg via INTRA_ARTICULAR

## 2020-12-22 NOTE — Progress Notes (Signed)
Eft knee   Office Visit Note   Patient: Judith Chavez           Date of Birth: 09-13-1945           MRN: [id number] Visit Date: 12/22/2020              Requested by: Mayra Neer, MD [address] [place],  [place] [address] PCP: Mayra Neer, MD   Assessment & Plan: Visit Diagnoses:  1. Unilateral primary osteoarthritis, left knee     Plan: Impression is left knee degenerative joint disease.  Today, we proceeded with the first Synvisc injection to the left knee.  She will ice and elevate as needed.  Follow-up with [place] as needed.  Follow-Up Instructions: Return if symptoms worsen or fail to improve.   Orders:  Orders Placed This Encounter  Procedures   Large Joint Inj: R knee   No orders of the defined types were placed in this encounter.     Procedures: Large Joint Inj: L knee on 12/22/2020 4:10 PM Indications: pain Details: 22 G needle, anterolateral approach Medications: 2 mL lidocaine 1 %; 2 mL bupivacaine 0.25 %; 48 mg Hylan 48 MG/6ML     Clinical Data: No additional findings.   Subjective: Chief Complaint  Patient presents with   Left Knee - Pain    HPI patient is a pleasant 75 year old female who comes in today for her first Synvisc injection to the left knee.  She does have underlying arthritis and has had cortisone injections in the past without significant relief.     Objective: Vital Signs: There were no vitals taken for this visit.    Ortho Exam unchanged left knee exam without effusion  Specialty Comments:  No specialty comments available.  Imaging: No new imaging   PMFS History: Patient Active Problem List   Diagnosis Date Noted   History of right breast cancer 04/13/2020   Genetic testing 03/10/2020   Family history of breast cancer 02/28/2020   Malignant neoplasm of upper-outer quadrant of right breast in female, estrogen receptor positive ([place]) 02/26/2020   Primary osteoarthritis of right knee 03/20/2019    Primary osteoarthritis of left knee 03/20/2019   Primary osteoarthritis of both knees 04/24/2017   Spondylolisthesis at L3-L4 level 06/01/2015   Past Medical History:  Diagnosis Date   Anxiety    Arthritis    knees   Cancer ([place]) 03/2020   right breast DCIS, LCIS   Depression    Diabetes mellitus without complication ([place])    Type 2   Family history of breast cancer 02/28/2020   Headache    migraines   History of pneumonia    Numbness    "left foot"    Poor circulation     Family History  Problem Relation Age of Onset   Breast cancer Cousin        maternal; dx and d. <50   Breast cancer Cousin        maternal; dx and d. <50   Breast cancer Cousin        maternal; dx and d. <50   Breast cancer Cousin        maternal; dx and d. <50   Cancer Paternal Aunt        unknown type cancer dx 18s    Past Surgical History:  Procedure Laterality Date   ABCESS DRAINAGE     "after cesarean- in hospital for 3 months"   ABDOMINAL HYSTERECTOMY  CESAREAN SECTION  1970   CHOLECYSTECTOMY     COLONOSCOPY     EYE SURGERY Right    MENISCUS REPAIR Bilateral    SIMPLE MASTECTOMY WITH AXILLARY SENTINEL NODE BIOPSY Right 04/13/2020   Procedure: RIGHT MASTECTOMY WITH RIGHT AXILLARY SENTINEL NODE BIOPSY;  Surgeon: Coralie Keens, MD;  Location: [facility];  Service: General;  Laterality: Right;   SPINE SURGERY     Social History   Occupational History   Occupation: retired   Tobacco Use   Smoking status: Former    Packs/day: 0.50    Years: 10.00    Pack years: 5.00    Types: Cigarettes    Quit date: 07/11/1986    Years since quitting: 34.4   Smokeless tobacco: Never  Substance and Sexual Activity   Alcohol use: Yes    Comment: occassionally   Drug use: No   Sexual activity: Not on file

## 2020-12-22 NOTE — Telephone Encounter (Signed)
Approved for SynviscOne, left knee. [place] Patient will be responsible for 20% OOP. No Co-pay No PA required  Appt. 12/22/2020

## 2021-01-01 ENCOUNTER — Ambulatory Visit
Admission: RE | Admit: 2021-01-01 | Discharge: 2021-01-01 | Disposition: A | Discharge status: Home / Self Care | Payer: Medicare Other | Source: Ambulatory Visit | Attending: Family Medicine | Admitting: Family Medicine

## 2021-01-01 ENCOUNTER — Other Ambulatory Visit: Payer: Self-pay

## 2021-01-01 DIAGNOSIS — Z780 Asymptomatic menopausal state: Secondary | ICD-10-CM | POA: Diagnosis not present

## 2021-01-01 DIAGNOSIS — M85852 Other specified disorders of bone density and structure, left thigh: Secondary | ICD-10-CM | POA: Diagnosis not present

## 2021-01-01 DIAGNOSIS — M8580 Other specified disorders of bone density and structure, unspecified site: Secondary | ICD-10-CM

## 2021-01-25 ENCOUNTER — Other Ambulatory Visit: Payer: Self-pay

## 2021-01-26 ENCOUNTER — Telehealth: Payer: Self-pay | Admitting: Orthopaedic Surgery

## 2021-01-26 ENCOUNTER — Other Ambulatory Visit: Payer: Self-pay | Admitting: Physician Assistant

## 2021-01-26 MED ORDER — METHOCARBAMOL 500 MG PO TABS: 500.0000 mg | ORAL_TABLET | Freq: Two times a day (BID) | ORAL | 0 refills | Status: DC | PRN

## 2021-01-26 MED ORDER — ONDANSETRON HCL 4 MG PO TABS: 4.0000 mg | ORAL_TABLET | Freq: Three times a day (TID) | ORAL | 0 refills | Status: DC | PRN

## 2021-01-26 MED ORDER — OXYCODONE-ACETAMINOPHEN 5-325 MG PO TABS: 1.0000 | ORAL_TABLET | Freq: Four times a day (QID) | ORAL | 0 refills | Status: DC | PRN

## 2021-01-26 MED ORDER — DOCUSATE SODIUM 100 MG PO CAPS: 100.0000 mg | ORAL_CAPSULE | Freq: Every day | ORAL | 2 refills | Status: DC | PRN

## 2021-01-26 MED ORDER — ASPIRIN EC 81 MG PO TBEC: 81.0000 mg | DELAYED_RELEASE_TABLET | Freq: Two times a day (BID) | ORAL | 0 refills | Status: DC

## 2021-01-26 NOTE — Telephone Encounter (Signed)
Pt needs someone to call her about her surgery. She got COVID and has to wait to have her surgery and she said she has left Debi several messages. She wants someone to call her back as soon as possible.  CB [phone]

## 2021-01-26 NOTE — Telephone Encounter (Signed)
Judith Chavez tested positive for COVID a couple days ago.  She is feeling okay but we will need to postpone the surgery for at least 3 weeks.  She would like a phone call to reschedule surgery.  Thank you.

## 2021-01-26 NOTE — Progress Notes (Signed)
I received a call from Mrs Suto who reports that she tested positive for Covid on Sunday. Patient states that she began having symptoms on Saturday, Fever to 100.5, coughing , chest congestion- color of mucous has turned yellow in color and patient feels awful. Mrs. dannah ryles a home test from [facility] on Sunday. Patientr's husband called an Urgent Care, who called a prescription for Paxlovid.  Mrs Touch states she is felling a little better. Mrs pommier asked if she should come for PAT appointment tomorrow, I said No.  Mrs. Blue asked if she would be able to have surgery on Monday, I said No. Patient said that she has called Dr. Phoebe Sharps office 01/25/21 and today 01/26/21 and no one has called her back. I explained to Mrs Hope that Dr. Erlinda Hong may be out of the office and staff are waiting  to speak with Dr. Erlinda Hong, patient is just wanting to hear that someone has the inform and are working on this.  I sent Dr. Erlinda Hong a staff message with the above information.

## 2021-01-26 NOTE — Pre-Procedure Instructions (Signed)
Surgical Instructions    Your procedure is scheduled on Monday. July 25th.  Report to [facility] Main Entrance "A" at 5:30 A.M., then check in with the Admitting office.  Call this number if you have problems the morning of surgery:  [phone]   If you have any questions prior to your surgery date call [phone]: Open Monday-Friday 8am-4pm    Remember:  Do not eat after midnight the night before your surgery  You may drink clear liquids until 4:30 a.m. the morning of your surgery.   Clear liquids allowed are: Water, Non-Citrus Juices (without pulp), Carbonated Beverages, Clear Tea, Black Coffee Only, and Gatorade.   Enhanced Recovery after Surgery for Orthopedics Enhanced Recovery after Surgery is a protocol used to improve the stress on your body and your recovery after surgery.  Patient Instructions  The day of surgery (if you have diabetes):  Drink ONE small 12 oz bottle of Gatorade G2 by 4:30 a.m. the morning of surgery This bottle was given to you during your hospital  pre-op appointment visit.  Nothing else to drink after completing the  Small 12 oz bottle of Gatorade G2.         If you have questions, please contact your surgeon's office.     Take these medicines the morning of surgery with A SIP OF WATER  tamoxifen (NOLVADEX) venlafaxine XR (EFFEXOR-XR)   Take the medicines as needed: ALPRAZolam Duanne Moron) traMADol (ULTRAM) Nasocort Allergy nasal spray  As of today, STOP taking any Aspirin (unless otherwise instructed by your surgeon) Aleve, Naproxen, Ibuprofen, Motrin, Advil, Goody's, BC's, all herbal medications, fish oil, and all vitamins.  WHAT DO I DO ABOUT MY DIABETES MEDICATION?   Do not take sitaGLIPtin (JANUVIA) the morning of surgery.  THE NIGHT BEFORE SURGERY, take 15 units of TRESIBA insulin.      THE MORNING OF SURGERY, take 15 units of TRESIBA insulin.  HOW TO MANAGE YOUR DIABETES BEFORE AND AFTER SURGERY  Why is it important to  control my blood sugar before and after surgery? Improving blood sugar levels before and after surgery helps healing and can limit problems. A way of improving blood sugar control is eating a healthy diet by:  Eating less sugar and carbohydrates  Increasing activity/exercise  Talking with your doctor about reaching your blood sugar goals High blood sugars (greater than 180 mg/dL) can raise your risk of infections and slow your recovery, so you will need to focus on controlling your diabetes during the weeks before surgery. Make sure that the doctor who takes care of your diabetes knows about your planned surgery including the date and location.  How do I manage my blood sugar before surgery? Check your blood sugar at least 4 times a day, starting 2 days before surgery, to make sure that the level is not too high or low.  Check your blood sugar the morning of your surgery when you wake up and every 2 hours until you get to the Short Stay unit.  If your blood sugar is less than 70 mg/dL, you will need to treat for low blood sugar: Do not take insulin. Treat a low blood sugar (less than 70 mg/dL) with  cup of clear juice (cranberry or apple), 4 glucose tablets, OR glucose gel. Recheck blood sugar in 15 minutes after treatment (to make sure it is greater than 70 mg/dL). If your blood sugar is not greater than 70 mg/dL on recheck, call [phone] for further instructions. Report your blood sugar to  the short stay nurse when you get to Short Stay.  If you are admitted to the hospital after surgery: Your blood sugar will be checked by the staff and you will probably be given insulin after surgery (instead of oral diabetes medicines) to make sure you have good blood sugar levels. The goal for blood sugar control after surgery is 80-180 mg/dL.                      Do NOT Smoke (Tobacco/Vaping) or drink Alcohol 24 hours prior to your procedure.  If you use a CPAP at night, you may bring all  equipment for your overnight stay.   Contacts, glasses, piercing's, hearing aid's, dentures or partials may not be worn into surgery, please bring cases for these belongings.    For patients admitted to the hospital, discharge time will be determined by your treatment team.   Patients discharged the day of surgery will not be allowed to drive home, and someone needs to stay with them for 24 hours.  ONLY 1 SUPPORT PERSON MAY BE PRESENT WHILE YOU ARE IN SURGERY. IF YOU ARE TO BE ADMITTED ONCE YOU ARE IN YOUR ROOM YOU WILL BE ALLOWED TWO (2) VISITORS.  Minor children may have two parents present. Special consideration for safety and communication needs will be reviewed on a case by case basis.   Special instructions:   [place]- Preparing For Surgery  Before surgery, you can play an important role. Because skin is not sterile, your skin needs to be as free of germs as possible. You can reduce the number of germs on your skin by washing with CHG (chlorahexidine gluconate) Soap before surgery.  CHG is an antiseptic cleaner which kills germs and bonds with the skin to continue killing germs even after washing.    Oral Hygiene is also important to reduce your risk of infection.  Remember - BRUSH YOUR TEETH THE MORNING OF SURGERY WITH YOUR REGULAR TOOTHPASTE  Please do not use if you have an allergy to CHG or antibacterial soaps. If your skin becomes reddened/irritated stop using the CHG.  Do not shave (including legs and underarms) for at least 48 hours prior to first CHG shower. It is OK to shave your face.  Please follow these instructions carefully.   Shower the NIGHT BEFORE SURGERY and the MORNING OF SURGERY  If you chose to wash your hair, wash your hair first as usual with your normal shampoo.  After you shampoo, rinse your hair and body thoroughly to remove the shampoo.  Use CHG Soap as you would any other liquid soap. You can apply CHG directly to the skin and wash gently with a  scrungie or a clean washcloth.   Apply the CHG Soap to your body ONLY FROM THE NECK DOWN.  Do not use on open wounds or open sores. Avoid contact with your eyes, ears, mouth and genitals (private parts). Wash Face and genitals (private parts)  with your normal soap.   Wash thoroughly, paying special attention to the area where your surgery will be performed.  Thoroughly rinse your body with warm water from the neck down.  DO NOT shower/wash with your normal soap after using and rinsing off the CHG Soap.  Pat yourself dry with a CLEAN TOWEL.  Wear CLEAN PAJAMAS to bed the night before surgery  Place CLEAN SHEETS on your bed the night before your surgery  DO NOT SLEEP WITH PETS.   Day of Surgery:  Shower with CHG soap. Do not wear jewelry, make up, nail polish, gel polish, artificial nails, or any other type of covering on natural nails including finger and toenails. If patients have artificial nails, gel coating, etc. that need to be removed by a nail salon please have this removed prior to surgery. Surgery may need to be canceled/delayed if the surgeon/ anesthesia feels like the patient is unable to be adequately monitored. Do not wear lotions, powders, perfumes, or deodorant. Do not shave 48 hours prior to surgery.   Do not bring valuables to the hospital. [facility] is not responsible for any belongings or valuables. Wear Clean/Comfortable clothing the morning of surgery Remember to brush your teeth WITH YOUR REGULAR TOOTHPASTE.   Please read over the following fact sheets that you were given.

## 2021-01-27 ENCOUNTER — Inpatient Hospital Stay (HOSPITAL_COMMUNITY)
Admission: RE | Admit: 2021-01-27 | Discharge: 2021-01-27 | Disposition: A | Discharge status: Home / Self Care | Payer: BC Managed Care – PPO | Source: Ambulatory Visit

## 2021-01-28 ENCOUNTER — Other Ambulatory Visit (HOSPITAL_COMMUNITY): Payer: Medicare Other

## 2021-01-29 ENCOUNTER — Other Ambulatory Visit: Payer: BC Managed Care – PPO

## 2021-02-01 ENCOUNTER — Ambulatory Visit (HOSPITAL_COMMUNITY): Admission: RE | Admit: 2021-02-01 | Payer: Medicare Other | Source: Home / Self Care | Admitting: Orthopaedic Surgery

## 2021-02-01 SURGERY — ARTHROPLASTY, KNEE, TOTAL
Anesthesia: Spinal | Site: Knee | Laterality: Right

## 2021-02-10 ENCOUNTER — Ambulatory Visit
Admission: RE | Admit: 2021-02-10 | Discharge: 2021-02-10 | Disposition: A | Discharge status: Home / Self Care | Payer: Medicare Other | Source: Ambulatory Visit | Attending: Nurse Practitioner | Admitting: Nurse Practitioner

## 2021-02-10 ENCOUNTER — Other Ambulatory Visit: Payer: Self-pay | Admitting: Nurse Practitioner

## 2021-02-10 ENCOUNTER — Other Ambulatory Visit: Payer: Self-pay

## 2021-02-10 ENCOUNTER — Ambulatory Visit: Admission: RE | Admit: 2021-02-10 | Payer: Medicare Other | Source: Ambulatory Visit

## 2021-02-10 DIAGNOSIS — C50411 Malignant neoplasm of upper-outer quadrant of right female breast: Secondary | ICD-10-CM

## 2021-02-10 DIAGNOSIS — Z1231 Encounter for screening mammogram for malignant neoplasm of breast: Secondary | ICD-10-CM | POA: Diagnosis not present

## 2021-02-10 DIAGNOSIS — Z170 Estrogen receptor positive status [ER+]: Secondary | ICD-10-CM

## 2021-02-12 NOTE — Progress Notes (Signed)
Surgical Instructions    Your procedure is scheduled on Monday, August 15th, 2022.   Report to [facility] Main Entrance "A" at 08:00 A.M., then check in with the Admitting office.  Call this number if you have problems the morning of surgery:  [phone]   If you have any questions prior to your surgery date call [phone]: Open Monday-Friday 8am-4pm    Remember:  Do not eat after midnight the night before your surgery  You may drink clear liquids until 07:00 the morning of your surgery.   Clear liquids allowed are: Water, Non-Citrus Juices (without pulp), Carbonated Beverages, Clear Tea, Black Coffee Only, and Gatorade    Take these medicines the morning of surgery with A SIP OF WATER:  tamoxifen (NOLVADEX) venlafaxine XR (EFFEXOR-XR)  If needed:  ALPRAZolam (XANAX) methocarbamol (ROBAXIN)  ondansetron (ZOFRAN)  oxyCODONE-acetaminophen (PERCOCET)   WHAT DO I DO ABOUT MY DIABETES MEDICATION?   Do not take sitaGLIPtin (JANUVIA) the morning of surgery.  THE NIGHT BEFORE SURGERY, take 15 units of TRESIBA insulin.       THE MORNING OF SURGERY, take 15 units of TRESIBA insulin.   HOW TO MANAGE YOUR DIABETES BEFORE AND AFTER SURGERY  Why is it important to control my blood sugar before and after surgery? Improving blood sugar levels before and after surgery helps healing and can limit problems. A way of improving blood sugar control is eating a healthy diet by:  Eating less sugar and carbohydrates  Increasing activity/exercise  Talking with your doctor about reaching your blood sugar goals High blood sugars (greater than 180 mg/dL) can raise your risk of infections and slow your recovery, so you will need to focus on controlling your diabetes during the weeks before surgery. Make sure that the doctor who takes care of your diabetes knows about your planned surgery including the date and location.  How do I manage my blood sugar before surgery? Check your blood  sugar at least 4 times a day, starting 2 days before surgery, to make sure that the level is not too high or low.  Check your blood sugar the morning of your surgery when you wake up and every 2 hours until you get to the Short Stay unit.  If your blood sugar is less than 70 mg/dL, you will need to treat for low blood sugar: Do not take insulin. Treat a low blood sugar (less than 70 mg/dL) with  cup of clear juice (cranberry or apple), 4 glucose tablets, OR glucose gel. Recheck blood sugar in 15 minutes after treatment (to make sure it is greater than 70 mg/dL). If your blood sugar is not greater than 70 mg/dL on recheck, call [phone] for further instructions. Report your blood sugar to the short stay nurse when you get to Short Stay.  If you are admitted to the hospital after surgery: Your blood sugar will be checked by the staff and you will probably be given insulin after surgery (instead of oral diabetes medicines) to make sure you have good blood sugar levels. The goal for blood sugar control after surgery is 80-180 mg/dL.   Follow your surgeon's instructions on when to stop Aspirin.  If no instructions were given by your surgeon then you will need to call the office to get those instructions.     As of today, STOP taking any Aspirin (unless otherwise instructed by your surgeon) Aleve, Naproxen, Ibuprofen, Motrin, Advil, Goody's, BC's, all herbal medications, fish oil, and all vitamins.  Do not wear jewelry or makeup Do not wear lotions, powders, perfumes, or deodorant. Do not shave 48 hours prior to surgery.   Do not bring valuables to the hospital. DO Not wear nail polish, gel polish, artificial nails, or any other type of covering on natural nails including finger and toenails. If patients have artificial nails, gel coating, etc. that need to be removed by a nail salon please have this removed prior to surgery or surgery may need to be canceled/delayed if the surgeon/  anesthesia feels like the patient is unable to be adequately monitored.             [facility] is not responsible for any belongings or valuables.  Do NOT Smoke (Tobacco/Vaping) or drink Alcohol 24 hours prior to your procedure If you use a CPAP at night, you may bring all equipment for your overnight stay.   Contacts, glasses, dentures or bridgework may not be worn into surgery, please bring cases for these belongings   For patients admitted to the hospital, discharge time will be determined by your treatment team.   Patients discharged the day of surgery will not be allowed to drive home, and someone needs to stay with them for 24 hours.  ONLY 1 SUPPORT PERSON MAY BE PRESENT WHILE YOU ARE IN SURGERY. IF YOU ARE TO BE ADMITTED ONCE YOU ARE IN YOUR ROOM YOU WILL BE ALLOWED TWO (2) VISITORS.  Minor children may have two parents present. Special consideration for safety and communication needs will be reviewed on a case by case basis.  Special instructions:    Oral Hygiene is also important to reduce your risk of infection.  Remember - BRUSH YOUR TEETH THE MORNING OF SURGERY WITH YOUR REGULAR TOOTHPASTE   [place]- Preparing For Surgery  Before surgery, you can play an important role. Because skin is not sterile, your skin needs to be as free of germs as possible. You can reduce the number of germs on your skin by washing with CHG (chlorahexidine gluconate) Soap before surgery.  CHG is an antiseptic cleaner which kills germs and bonds with the skin to continue killing germs even after washing.     Please do not use if you have an allergy to CHG or antibacterial soaps. If your skin becomes reddened/irritated stop using the CHG.  Do not shave (including legs and underarms) for at least 48 hours prior to first CHG shower. It is OK to shave your face.  Please follow these instructions carefully.     Shower the NIGHT BEFORE SURGERY and the MORNING OF SURGERY with CHG Soap.   If you chose  to wash your hair, wash your hair first as usual with your normal shampoo. After you shampoo, rinse your hair and body thoroughly to remove the shampoo.  Then [employer] and genitals (private parts) with your normal soap and rinse thoroughly to remove soap.  After that Use CHG Soap as you would any other liquid soap. You can apply CHG directly to the skin and wash gently with a scrungie or a clean washcloth.   Apply the CHG Soap to your body ONLY FROM THE NECK DOWN.  Do not use on open wounds or open sores. Avoid contact with your eyes, ears, mouth and genitals (private parts). Wash Face and genitals (private parts)  with your normal soap.   Wash thoroughly, paying special attention to the area where your surgery will be performed.  Thoroughly rinse your body with warm water from the  neck down.  DO NOT shower/wash with your normal soap after using and rinsing off the CHG Soap.  Pat yourself dry with a CLEAN TOWEL.  Wear CLEAN PAJAMAS to bed the night before surgery  Place CLEAN SHEETS on your bed the night before your surgery  DO NOT SLEEP WITH PETS.   Day of Surgery:  Take a shower with CHG soap. Wear Clean/Comfortable clothing the morning of surgery Do not apply any deodorants/lotions.   Remember to brush your teeth WITH YOUR REGULAR TOOTHPASTE.   Please read over the following fact sheets that you were given.

## 2021-02-15 ENCOUNTER — Other Ambulatory Visit: Payer: Self-pay

## 2021-02-15 ENCOUNTER — Encounter (HOSPITAL_COMMUNITY): Payer: Self-pay

## 2021-02-15 ENCOUNTER — Other Ambulatory Visit: Payer: Self-pay | Admitting: Physician Assistant

## 2021-02-15 ENCOUNTER — Encounter (HOSPITAL_COMMUNITY)
Admission: RE | Admit: 2021-02-15 | Discharge: 2021-02-15 | Disposition: A | Discharge status: Home / Self Care | Payer: Medicare Other | Source: Ambulatory Visit | Attending: Orthopaedic Surgery | Admitting: Orthopaedic Surgery

## 2021-02-15 DIAGNOSIS — Z01812 Encounter for preprocedural laboratory examination: Secondary | ICD-10-CM | POA: Insufficient documentation

## 2021-02-15 HISTORY — DX: Anemia, unspecified: D64.9

## 2021-02-15 HISTORY — DX: Pneumonia, unspecified organism: J18.9

## 2021-02-15 LAB — CBC
HCT: 41 % (ref 36.0–46.0)
Hemoglobin: 13.2 g/dL (ref 12.0–15.0)
MCH: 28 pg (ref 26.0–34.0)
MCHC: 32.2 g/dL (ref 30.0–36.0)
MCV: 86.9 fL (ref 80.0–100.0)
Platelets: 241 10*3/uL (ref 150–400)
RBC: 4.72 MIL/uL (ref 3.87–5.11)
RDW: 13.2 % (ref 11.5–15.5)
WBC: 6.2 10*3/uL (ref 4.0–10.5)
nRBC: 0 % (ref 0.0–0.2)

## 2021-02-15 LAB — BASIC METABOLIC PANEL
Anion gap: 7 (ref 5–15)
BUN: 12 mg/dL (ref 8–23)
CO2: 26 mmol/L (ref 22–32)
Calcium: 9.3 mg/dL (ref 8.9–10.3)
Chloride: 107 mmol/L (ref 98–111)
Creatinine, Ser: 0.58 mg/dL (ref 0.44–1.00)
GFR, Estimated: >60 mL/min (ref >60)
Glucose, Bld: 168 mg/dL — ABNORMAL HIGH (ref 70–99)
Potassium: 4 mmol/L (ref 3.5–5.1)
Sodium: 140 mmol/L (ref 135–145)

## 2021-02-15 LAB — GLUCOSE, CAPILLARY: Glucose-Capillary: 162 mg/dL — ABNORMAL HIGH (ref 70–99)

## 2021-02-15 LAB — SURGICAL PCR SCREEN
MRSA, PCR: NEGATIVE
Staphylococcus aureus: NEGATIVE

## 2021-02-15 LAB — HEMOGLOBIN A1C
Hgb A1c MFr Bld: 7.4 % — ABNORMAL HIGH (ref 4.8–5.6)
Mean Plasma Glucose: 165.68 mg/dL

## 2021-02-15 NOTE — Progress Notes (Signed)
Elevated BP at PAT appt:  Pt BP elevated. Pt reports being nervous about surgery. BP rechecked several times after PAT interview, BP 150/97   02/15/21 0949  Vitals  Temp 98.5 F (36.9 C)  Temp Source Oral  Pulse Rate 80  Resp 17  BP (!) 160/92  SpO2 97 %

## 2021-02-15 NOTE — Progress Notes (Addendum)
Surgical Instructions    Your procedure is scheduled on Monday, August 15th, 2022.   Report to [facility] Main Entrance "A" at 08:00 A.M., then check in with the Admitting office.  Call this number if you have problems the morning of surgery:  [phone]   If you have any questions prior to your surgery date call [phone]: Open Monday-Friday 8am-4pm    Remember:  Do not eat after midnight the night before your surgery  You may drink clear liquids until 07:00 the morning of your surgery.   Clear liquids allowed are: Water, Non-Citrus Juices (without pulp), Carbonated Beverages, Clear Tea, Black Coffee Only, and Gatorade    Take these medicines the morning of surgery with A SIP OF WATER:  If needed: ALPRAZolam Duanne Moron)   WHAT DO I DO ABOUT MY DIABETES MEDICATION?  Do not take sitaGLIPtin (JANUVIA) the morning of surgery.  THE MORNING OF SURGERY, take 15 units of TRESIBA insulin.   HOW TO MANAGE YOUR DIABETES BEFORE AND AFTER SURGERY  Why is it important to control my blood sugar before and after surgery? Improving blood sugar levels before and after surgery helps healing and can limit problems. A way of improving blood sugar control is eating a healthy diet by:  Eating less sugar and carbohydrates  Increasing activity/exercise  Talking with your doctor about reaching your blood sugar goals High blood sugars (greater than 180 mg/dL) can raise your risk of infections and slow your recovery, so you will need to focus on controlling your diabetes during the weeks before surgery. Make sure that the doctor who takes care of your diabetes knows about your planned surgery including the date and location.  How do I manage my blood sugar before surgery? Check your blood sugar at least 4 times a day, starting 2 days before surgery, to make sure that the level is not too high or low.  Check your blood sugar the morning of your surgery when you wake up and every 2 hours until you  get to the Short Stay unit.  If your blood sugar is less than 70 mg/dL, you will need to treat for low blood sugar: Do not take insulin. Treat a low blood sugar (less than 70 mg/dL) with  cup of clear juice (cranberry or apple), 4 glucose tablets, OR glucose gel. Recheck blood sugar in 15 minutes after treatment (to make sure it is greater than 70 mg/dL). If your blood sugar is not greater than 70 mg/dL on recheck, call [phone] for further instructions. Report your blood sugar to the short stay nurse when you get to Short Stay.  If you are admitted to the hospital after surgery: Your blood sugar will be checked by the staff and you will probably be given insulin after surgery (instead of oral diabetes medicines) to make sure you have good blood sugar levels. The goal for blood sugar control after surgery is 80-180 mg/dL.   Follow your surgeon's instructions on when to stop Aspirin.  If no instructions were given by your surgeon then you will need to call the office to get those instructions.     As of today, STOP taking any Aspirin (unless otherwise instructed by your surgeon) Aleve, Naproxen, Ibuprofen, Motrin, Advil, Goody's, BC's, all herbal medications, fish oil, and all vitamins.           The Morning of Surgery: Do not wear jewelry or makeup Do not wear lotions, powders, perfumes, or deodorant. Do not shave 48 hours prior to  surgery.   Do not bring valuables to the hospital. DO Not wear nail polish, gel polish, artificial nails, or any other type of covering on natural nails including finger and toenails. If patients have artificial nails, gel coating, etc. that need to be removed by a nail salon please have this removed prior to surgery or surgery may need to be canceled/delayed if the surgeon/ anesthesia feels like the patient is unable to be adequately monitored.             [facility] is not responsible for any belongings or valuables.  Do NOT Smoke (Tobacco/Vaping) or  drink Alcohol 24 hours prior to your procedure If you use a CPAP at night, you may bring all equipment for your overnight stay.   Contacts, glasses, dentures or bridgework may not be worn into surgery, please bring cases for these belongings   For patients admitted to the hospital, discharge time will be determined by your treatment team.   Patients discharged the day of surgery will not be allowed to drive home, and someone needs to stay with them for 24 hours.  ONLY 1 SUPPORT PERSON MAY BE PRESENT WHILE YOU ARE IN SURGERY. IF YOU ARE TO BE ADMITTED ONCE YOU ARE IN YOUR ROOM YOU WILL BE ALLOWED TWO (2) VISITORS.  Minor children may have two parents present. Special consideration for safety and communication needs will be reviewed on a case by case basis.  Special instructions:    Oral Hygiene is also important to reduce your risk of infection.  Remember - BRUSH YOUR TEETH THE MORNING OF SURGERY WITH YOUR REGULAR TOOTHPASTE   - Preparing For Surgery  Before surgery, you can play an important role. Because skin is not sterile, your skin needs to be as free of germs as possible. You can reduce the number of germs on your skin by washing with CHG (chlorahexidine gluconate) Soap before surgery.  CHG is an antiseptic cleaner which kills germs and bonds with the skin to continue killing germs even after washing.     Please do not use if you have an allergy to CHG or antibacterial soaps. If your skin becomes reddened/irritated stop using the CHG.  Do not shave (including legs and underarms) for at least 48 hours prior to first CHG shower. It is OK to shave your face.  Please follow these instructions carefully.     Shower the NIGHT BEFORE SURGERY and the MORNING OF SURGERY with CHG Soap.   If you chose to wash your hair, wash your hair first as usual with your normal shampoo. After you shampoo, rinse your hair and body thoroughly to remove the shampoo.  Then [employer] and genitals  (private parts) with your normal soap and rinse thoroughly to remove soap.  After that Use CHG Soap as you would any other liquid soap. You can apply CHG directly to the skin and wash gently with a scrungie or a clean washcloth.   Apply the CHG Soap to your body ONLY FROM THE NECK DOWN.  Do not use on open wounds or open sores. Avoid contact with your eyes, ears, mouth and genitals (private parts). Wash Face and genitals (private parts)  with your normal soap.   Wash thoroughly, paying special attention to the area where your surgery will be performed.  Thoroughly rinse your body with warm water from the neck down.  DO NOT shower/wash with your normal soap after using and rinsing off the CHG Soap.  Pat yourself  dry with a CLEAN TOWEL.  Wear CLEAN PAJAMAS to bed the night before surgery  Place CLEAN SHEETS on your bed the night before your surgery  DO NOT SLEEP WITH PETS.   Day of Surgery:  Take a shower with CHG soap. Wear Clean/Comfortable clothing the morning of surgery Do not apply any deodorants/lotions.   Remember to brush your teeth WITH YOUR REGULAR TOOTHPASTE.   Please read over the following fact sheets that you were given.

## 2021-02-15 NOTE — Progress Notes (Signed)
No Procedure orders. IBM'd [name], PA-C.

## 2021-02-15 NOTE — Progress Notes (Addendum)
PCP - Mayra Neer, MD w/ [facility]; records requested Cardiologist - Denies Oncologist- Truitt Merle, MD  PPM/ICD - Denies  Chest x-ray - N/A EKG - 04/09/20 Stress Test - Per pt, > 10 years ago, results were negative ECHO - 03/06/10 Cardiac Cath - Denies  Sleep Study - Denies  Fasting Blood Sugar - 100-120's Checks Blood Sugar continuously w/ Freestyle Libre  Blood Thinner Instructions: N/A Aspirin Instructions:N/A  ERAS Protcol - Yes PRE-SURGERY Ensure or G2- Not ordered  COVID TEST- N/A; Pt tested positive for COVID 01/24/21 via home test. Per pt, she reached out to her PCP the same day and received prescription for 5 day course for Paxlovid. Requested records from PCP addressing this. Patient denies shortness of breath, fever, cough and chest pain at PAT appointment   Anesthesia review: Yes, review last office note and labs from PCP.   All instructions explained to the patient, with a verbal understanding of the material. Patient agrees to go over the instructions while at home for a better understanding. The opportunity to ask questions was provided.

## 2021-02-16 ENCOUNTER — Encounter: Payer: Medicare Other | Admitting: Orthopaedic Surgery

## 2021-02-16 ENCOUNTER — Other Ambulatory Visit: Payer: Self-pay | Admitting: Nurse Practitioner

## 2021-02-17 ENCOUNTER — Other Ambulatory Visit: Payer: Self-pay

## 2021-02-17 DIAGNOSIS — Z170 Estrogen receptor positive status [ER+]: Secondary | ICD-10-CM

## 2021-02-17 DIAGNOSIS — C50411 Malignant neoplasm of upper-outer quadrant of right female breast: Secondary | ICD-10-CM

## 2021-02-17 MED ORDER — VENLAFAXINE HCL ER 37.5 MG PO CP24
37.5000 mg | ORAL_CAPSULE | Freq: Every day | ORAL | 1 refills | Status: DC
Start: 2021-02-17 — End: 2021-12-28

## 2021-02-19 ENCOUNTER — Telehealth: Payer: Self-pay | Admitting: *Deleted

## 2021-02-19 ENCOUNTER — Other Ambulatory Visit: Payer: Self-pay | Admitting: *Deleted

## 2021-02-19 DIAGNOSIS — M1711 Unilateral primary osteoarthritis, right knee: Secondary | ICD-10-CM

## 2021-02-19 NOTE — Telephone Encounter (Signed)
Ortho bundle pre-op call completed. 

## 2021-02-19 NOTE — Care Plan (Signed)
[facility] RNCM call to patient to discuss her upcoming Right total knee arthroplasty with Dr. Erlinda Hong on 02/22/21. She is an Ortho bundle patient through [facility] and is agreeable to case management. She lives with her husband, who will be assisting at home after discharge. She has all DME needed post-op (CPM, FWW, 3in1/BSC) delivered by [employer] to her home prior to surgery. Anticipate HHPT will be needed after a short hospital stay. Referral made to [facility] after choice provided. Reviewed all post-op care instructions. Will continue to follow for needs.

## 2021-02-19 NOTE — Progress Notes (Signed)
Patient did not answer phone. Left voicemail with new arrival time of 0530.  Patient's husband voiced understanding of new arrival time of 0530 on Monday.

## 2021-02-22 ENCOUNTER — Ambulatory Visit (HOSPITAL_COMMUNITY): Payer: Medicare Other | Admitting: Certified Registered"

## 2021-02-22 ENCOUNTER — Ambulatory Visit (HOSPITAL_COMMUNITY): Payer: Medicare Other | Admitting: Physician Assistant

## 2021-02-22 ENCOUNTER — Encounter (HOSPITAL_COMMUNITY): Payer: Self-pay | Admitting: Orthopaedic Surgery

## 2021-02-22 ENCOUNTER — Other Ambulatory Visit: Payer: Self-pay

## 2021-02-22 ENCOUNTER — Observation Stay (HOSPITAL_COMMUNITY): Payer: Medicare Other

## 2021-02-22 ENCOUNTER — Encounter (HOSPITAL_COMMUNITY)
Admission: RE | Disposition: A | Discharge status: Home / Self Care | Payer: Self-pay | Source: Home / Self Care | Attending: Surgical

## 2021-02-22 ENCOUNTER — Observation Stay (HOSPITAL_COMMUNITY)
Admission: RE | Admit: 2021-02-22 | Discharge: 2021-02-23 | Disposition: A | Discharge status: Home / Self Care | Payer: Medicare Other | Attending: Surgical | Admitting: Surgical

## 2021-02-22 DIAGNOSIS — Z79899 Other long term (current) drug therapy: Secondary | ICD-10-CM | POA: Diagnosis not present

## 2021-02-22 DIAGNOSIS — R52 Pain, unspecified: Secondary | ICD-10-CM | POA: Insufficient documentation

## 2021-02-22 DIAGNOSIS — M1711 Unilateral primary osteoarthritis, right knee: Secondary | ICD-10-CM | POA: Diagnosis not present

## 2021-02-22 DIAGNOSIS — Z01812 Encounter for preprocedural laboratory examination: Secondary | ICD-10-CM | POA: Diagnosis not present

## 2021-02-22 DIAGNOSIS — E119 Type 2 diabetes mellitus without complications: Secondary | ICD-10-CM | POA: Diagnosis not present

## 2021-02-22 DIAGNOSIS — Z7984 Long term (current) use of oral hypoglycemic drugs: Secondary | ICD-10-CM | POA: Diagnosis not present

## 2021-02-22 DIAGNOSIS — Z7982 Long term (current) use of aspirin: Secondary | ICD-10-CM | POA: Diagnosis not present

## 2021-02-22 DIAGNOSIS — Z471 Aftercare following joint replacement surgery: Secondary | ICD-10-CM | POA: Diagnosis not present

## 2021-02-22 DIAGNOSIS — G8918 Other acute postprocedural pain: Secondary | ICD-10-CM | POA: Diagnosis not present

## 2021-02-22 DIAGNOSIS — Z853 Personal history of malignant neoplasm of breast: Secondary | ICD-10-CM | POA: Diagnosis not present

## 2021-02-22 DIAGNOSIS — F418 Other specified anxiety disorders: Secondary | ICD-10-CM | POA: Diagnosis not present

## 2021-02-22 DIAGNOSIS — G43909 Migraine, unspecified, not intractable, without status migrainosus: Secondary | ICD-10-CM | POA: Diagnosis not present

## 2021-02-22 DIAGNOSIS — Z87891 Personal history of nicotine dependence: Secondary | ICD-10-CM | POA: Insufficient documentation

## 2021-02-22 DIAGNOSIS — Z96651 Presence of right artificial knee joint: Secondary | ICD-10-CM | POA: Diagnosis not present

## 2021-02-22 HISTORY — PX: TOTAL KNEE ARTHROPLASTY: SHX125

## 2021-02-22 LAB — COMPREHENSIVE METABOLIC PANEL
ALT: 19 U/L (ref 0–44)
AST: 22 U/L (ref 15–41)
Albumin: 3.2 g/dL — ABNORMAL LOW (ref 3.5–5.0)
Alkaline Phosphatase: 54 U/L (ref 38–126)
Anion gap: 11 (ref 5–15)
BUN: 11 mg/dL (ref 8–23)
CO2: 21 mmol/L — ABNORMAL LOW (ref 22–32)
Calcium: 8.5 mg/dL — ABNORMAL LOW (ref 8.9–10.3)
Chloride: 101 mmol/L (ref 98–111)
Creatinine, Ser: 0.67 mg/dL (ref 0.44–1.00)
GFR, Estimated: >60 mL/min (ref >60)
Glucose, Bld: 288 mg/dL — ABNORMAL HIGH (ref 70–99)
Potassium: 4 mmol/L (ref 3.5–5.1)
Sodium: 133 mmol/L — ABNORMAL LOW (ref 135–145)
Total Bilirubin: 0.3 mg/dL (ref 0.3–1.2)
Total Protein: 6.6 g/dL (ref 6.5–8.1)

## 2021-02-22 LAB — APTT: aPTT: 24 seconds (ref 24–36)

## 2021-02-22 LAB — GLUCOSE, CAPILLARY
Glucose-Capillary: 126 mg/dL — ABNORMAL HIGH (ref 70–99)
Glucose-Capillary: 123 mg/dL — ABNORMAL HIGH (ref 70–99)
Glucose-Capillary: 195 mg/dL — ABNORMAL HIGH (ref 70–99)
Glucose-Capillary: 263 mg/dL — ABNORMAL HIGH (ref 70–99)

## 2021-02-22 LAB — URINALYSIS, ROUTINE W REFLEX MICROSCOPIC
Bilirubin Urine: NEGATIVE
Glucose, UA: NEGATIVE mg/dL
Hgb urine dipstick: NEGATIVE
Ketones, ur: NEGATIVE mg/dL
Nitrite: NEGATIVE
Protein, ur: NEGATIVE mg/dL
Specific Gravity, Urine: 1.018 (ref 1.005–1.030)
pH: 5 (ref 5.0–8.0)

## 2021-02-22 LAB — PROTIME-INR
INR: 1.1 (ref 0.8–1.2)
Prothrombin Time: 14.6 seconds (ref 11.4–15.2)

## 2021-02-22 SURGERY — ARTHROPLASTY, KNEE, TOTAL
Anesthesia: Spinal | Site: Knee | Laterality: Right

## 2021-02-22 MED ORDER — HYDROMORPHONE HCL 1 MG/ML IJ SOLN
0.2500 mg | INTRAMUSCULAR | Status: DC | PRN
  Administered 2021-02-22: 0.25 mg via INTRAVENOUS

## 2021-02-22 MED ORDER — PROPOFOL 10 MG/ML IV BOLUS
INTRAVENOUS | Status: AC
  Filled 2021-02-22: qty 20

## 2021-02-22 MED ORDER — ACETAMINOPHEN 325 MG PO TABS: 325.0000 mg | ORAL_TABLET | Freq: Four times a day (QID) | ORAL | Status: DC | PRN

## 2021-02-22 MED ORDER — ONDANSETRON HCL 4 MG/2ML IJ SOLN: 4.0000 mg | Freq: Four times a day (QID) | INTRAMUSCULAR | Status: DC | PRN

## 2021-02-22 MED ORDER — MIDAZOLAM HCL 2 MG/2ML IJ SOLN
INTRAMUSCULAR | Status: AC
  Filled 2021-02-22: qty 2

## 2021-02-22 MED ORDER — OXYCODONE HCL 5 MG PO TABS: 10.0000 mg | ORAL_TABLET | ORAL | Status: DC | PRN

## 2021-02-22 MED ORDER — LINAGLIPTIN 5 MG PO TABS
5.0000 mg | ORAL_TABLET | Freq: Every day | ORAL | Status: DC
  Administered 2021-02-22 – 2021-02-23 (×2): 5 mg via ORAL
  Filled 2021-02-22 (×2): qty 1

## 2021-02-22 MED ORDER — PHENYLEPHRINE HCL-NACL 20-0.9 MG/250ML-% IV SOLN
INTRAVENOUS | Status: DC | PRN
  Administered 2021-02-22: 20 ug/min via INTRAVENOUS

## 2021-02-22 MED ORDER — DOCUSATE SODIUM 100 MG PO CAPS
100.0000 mg | ORAL_CAPSULE | Freq: Two times a day (BID) | ORAL | Status: DC
  Administered 2021-02-22 – 2021-02-23 (×3): 100 mg via ORAL
  Filled 2021-02-22 (×3): qty 1

## 2021-02-22 MED ORDER — EPHEDRINE SULFATE 50 MG/ML IJ SOLN
INTRAMUSCULAR | Status: DC | PRN
  Administered 2021-02-22: 5 mg via INTRAVENOUS

## 2021-02-22 MED ORDER — TRANEXAMIC ACID-NACL 1000-0.7 MG/100ML-% IV SOLN
INTRAVENOUS | Status: AC
  Filled 2021-02-22: qty 100

## 2021-02-22 MED ORDER — HYDROMORPHONE HCL 1 MG/ML IJ SOLN: 0.5000 mg | INTRAMUSCULAR | Status: DC | PRN

## 2021-02-22 MED ORDER — INSULIN ASPART 100 UNIT/ML IJ SOLN
0.0000 [IU] | Freq: Every day | INTRAMUSCULAR | Status: DC
  Administered 2021-02-22: 3 [IU] via SUBCUTANEOUS

## 2021-02-22 MED ORDER — PROPOFOL 500 MG/50ML IV EMUL
INTRAVENOUS | Status: DC | PRN
  Administered 2021-02-22: 75 ug/kg/min via INTRAVENOUS

## 2021-02-22 MED ORDER — LACTATED RINGERS IV SOLN: INTRAVENOUS | Status: DC

## 2021-02-22 MED ORDER — 0.9 % SODIUM CHLORIDE (POUR BTL) OPTIME
TOPICAL | Status: DC | PRN
  Administered 2021-02-22: 1000 mL

## 2021-02-22 MED ORDER — AMISULPRIDE (ANTIEMETIC) 5 MG/2ML IV SOLN
5.0000 mg | Freq: Once | INTRAVENOUS | Status: AC
  Administered 2021-02-22: 5 mg via INTRAVENOUS

## 2021-02-22 MED ORDER — POLYETHYLENE GLYCOL 3350 17 G PO PACK: 17.0000 g | PACK | Freq: Every day | ORAL | Status: DC | PRN

## 2021-02-22 MED ORDER — CEFAZOLIN SODIUM-DEXTROSE 2-4 GM/100ML-% IV SOLN
INTRAVENOUS | Status: AC
  Filled 2021-02-22: qty 100

## 2021-02-22 MED ORDER — IRRISEPT - 450ML BOTTLE WITH 0.05% CHG IN STERILE WATER, USP 99.95% OPTIME
TOPICAL | Status: DC | PRN
  Administered 2021-02-22: 450 mL via TOPICAL

## 2021-02-22 MED ORDER — EPHEDRINE 5 MG/ML INJ
INTRAVENOUS | Status: AC
  Filled 2021-02-22: qty 5

## 2021-02-22 MED ORDER — PROMETHAZINE HCL 25 MG/ML IJ SOLN: 6.2500 mg | INTRAMUSCULAR | Status: DC | PRN

## 2021-02-22 MED ORDER — CEFAZOLIN SODIUM-DEXTROSE 2-4 GM/100ML-% IV SOLN
2.0000 g | Freq: Four times a day (QID) | INTRAVENOUS | Status: AC
  Administered 2021-02-22 (×2): 2 g via INTRAVENOUS
  Filled 2021-02-22 (×2): qty 100

## 2021-02-22 MED ORDER — ACETAMINOPHEN 500 MG PO TABS
1000.0000 mg | ORAL_TABLET | Freq: Four times a day (QID) | ORAL | Status: AC
  Administered 2021-02-22 – 2021-02-23 (×3): 1000 mg via ORAL
  Filled 2021-02-22 (×3): qty 2

## 2021-02-22 MED ORDER — PROPOFOL 10 MG/ML IV BOLUS
INTRAVENOUS | Status: DC | PRN
  Administered 2021-02-22: 30 mg via INTRAVENOUS
  Administered 2021-02-22: 50 mg via INTRAVENOUS

## 2021-02-22 MED ORDER — ONDANSETRON HCL 4 MG/2ML IJ SOLN
INTRAMUSCULAR | Status: DC | PRN
  Administered 2021-02-22: 4 mg via INTRAVENOUS

## 2021-02-22 MED ORDER — TRANEXAMIC ACID 1000 MG/10ML IV SOLN
2000.0000 mg | INTRAVENOUS | Status: DC
  Filled 2021-02-22: qty 20

## 2021-02-22 MED ORDER — CHLORHEXIDINE GLUCONATE 0.12 % MT SOLN: 15.0000 mL | Freq: Once | OROMUCOSAL | Status: AC

## 2021-02-22 MED ORDER — BUPIVACAINE IN DEXTROSE 0.75-8.25 % IT SOLN
INTRATHECAL | Status: DC | PRN
  Administered 2021-02-22: 2 mL via INTRATHECAL

## 2021-02-22 MED ORDER — SODIUM CHLORIDE 0.9 % IR SOLN
Status: DC | PRN
  Administered 2021-02-22: 3000 mL

## 2021-02-22 MED ORDER — PHENOL 1.4 % MT LIQD: 1.0000 | OROMUCOSAL | Status: DC | PRN

## 2021-02-22 MED ORDER — OXYCODONE HCL 5 MG PO TABS: 5.0000 mg | ORAL_TABLET | Freq: Once | ORAL | Status: DC | PRN

## 2021-02-22 MED ORDER — PROMETHAZINE HCL 25 MG/ML IJ SOLN
INTRAMUSCULAR | Status: AC
  Filled 2021-02-22: qty 1

## 2021-02-22 MED ORDER — MIDAZOLAM HCL 5 MG/5ML IJ SOLN
INTRAMUSCULAR | Status: DC | PRN
  Administered 2021-02-22: 1 mg via INTRAVENOUS

## 2021-02-22 MED ORDER — INSULIN ASPART 100 UNIT/ML IJ SOLN
0.0000 [IU] | Freq: Three times a day (TID) | INTRAMUSCULAR | Status: DC
  Administered 2021-02-22: 3 [IU] via SUBCUTANEOUS
  Administered 2021-02-23: 5 [IU] via SUBCUTANEOUS
  Administered 2021-02-23: 8 [IU] via SUBCUTANEOUS

## 2021-02-22 MED ORDER — ORAL CARE MOUTH RINSE: 15.0000 mL | Freq: Once | OROMUCOSAL | Status: AC

## 2021-02-22 MED ORDER — OXYCODONE HCL 5 MG/5ML PO SOLN: 5.0000 mg | Freq: Once | ORAL | Status: DC | PRN

## 2021-02-22 MED ORDER — FENTANYL CITRATE (PF) 250 MCG/5ML IJ SOLN
INTRAMUSCULAR | Status: AC
  Filled 2021-02-22: qty 5

## 2021-02-22 MED ORDER — ASPIRIN 81 MG PO CHEW
81.0000 mg | CHEWABLE_TABLET | Freq: Two times a day (BID) | ORAL | Status: DC
  Administered 2021-02-22 – 2021-02-23 (×2): 81 mg via ORAL
  Filled 2021-02-22 (×2): qty 1

## 2021-02-22 MED ORDER — VENLAFAXINE HCL ER 75 MG PO CP24
75.0000 mg | ORAL_CAPSULE | Freq: Every day | ORAL | Status: DC
  Administered 2021-02-23: 75 mg via ORAL
  Filled 2021-02-22: qty 1

## 2021-02-22 MED ORDER — ROPIVACAINE HCL 5 MG/ML IJ SOLN
INTRAMUSCULAR | Status: DC | PRN
  Administered 2021-02-22: 20 mL via PERINEURAL

## 2021-02-22 MED ORDER — MENTHOL 3 MG MT LOZG
1.0000 | LOZENGE | OROMUCOSAL | Status: DC | PRN
  Filled 2021-02-22: qty 9

## 2021-02-22 MED ORDER — BUPIVACAINE-MELOXICAM ER 400-12 MG/14ML IJ SOLN
INTRAMUSCULAR | Status: AC
  Filled 2021-02-22: qty 1

## 2021-02-22 MED ORDER — METHOCARBAMOL 500 MG PO TABS
500.0000 mg | ORAL_TABLET | Freq: Four times a day (QID) | ORAL | Status: DC | PRN
  Administered 2021-02-22 – 2021-02-23 (×3): 500 mg via ORAL
  Filled 2021-02-22 (×3): qty 1

## 2021-02-22 MED ORDER — METOCLOPRAMIDE HCL 5 MG/ML IJ SOLN
5.0000 mg | Freq: Three times a day (TID) | INTRAMUSCULAR | Status: DC | PRN
Start: 2021-02-22 — End: 2021-02-23
  Administered 2021-02-22: 10 mg via INTRAVENOUS
  Filled 2021-02-22: qty 2

## 2021-02-22 MED ORDER — CHLORHEXIDINE GLUCONATE 0.12 % MT SOLN
OROMUCOSAL | Status: AC
  Administered 2021-02-22: 15 mL via OROMUCOSAL
  Filled 2021-02-22: qty 15

## 2021-02-22 MED ORDER — POVIDONE-IODINE 10 % EX SWAB
2.0000 "application " | Freq: Once | CUTANEOUS | Status: AC
  Administered 2021-02-22: 2 via TOPICAL

## 2021-02-22 MED ORDER — TRANEXAMIC ACID 1000 MG/10ML IV SOLN
INTRAVENOUS | Status: DC | PRN
  Administered 2021-02-22: 2000 mg via TOPICAL

## 2021-02-22 MED ORDER — VANCOMYCIN HCL 1000 MG IV SOLR
INTRAVENOUS | Status: AC
  Filled 2021-02-22: qty 1000

## 2021-02-22 MED ORDER — TRANEXAMIC ACID-NACL 1000-0.7 MG/100ML-% IV SOLN
1000.0000 mg | INTRAVENOUS | Status: AC
  Administered 2021-02-22: 1000 mg via INTRAVENOUS

## 2021-02-22 MED ORDER — CEFAZOLIN SODIUM-DEXTROSE 2-4 GM/100ML-% IV SOLN
2.0000 g | INTRAVENOUS | Status: AC
  Administered 2021-02-22: 2 g via INTRAVENOUS

## 2021-02-22 MED ORDER — ONDANSETRON HCL 4 MG/2ML IJ SOLN
INTRAMUSCULAR | Status: AC
  Filled 2021-02-22: qty 4

## 2021-02-22 MED ORDER — AMISULPRIDE (ANTIEMETIC) 5 MG/2ML IV SOLN
INTRAVENOUS | Status: AC
  Filled 2021-02-22: qty 2

## 2021-02-22 MED ORDER — VANCOMYCIN HCL 1000 MG IV SOLR
INTRAVENOUS | Status: DC | PRN
  Administered 2021-02-22: 1000 mg via TOPICAL

## 2021-02-22 MED ORDER — METOCLOPRAMIDE HCL 5 MG PO TABS: 5.0000 mg | ORAL_TABLET | Freq: Three times a day (TID) | ORAL | Status: DC | PRN

## 2021-02-22 MED ORDER — DEXAMETHASONE SODIUM PHOSPHATE 10 MG/ML IJ SOLN
10.0000 mg | Freq: Once | INTRAMUSCULAR | Status: AC
  Administered 2021-02-23: 10 mg via INTRAVENOUS
  Filled 2021-02-22: qty 1

## 2021-02-22 MED ORDER — TRANEXAMIC ACID-NACL 1000-0.7 MG/100ML-% IV SOLN
1000.0000 mg | Freq: Once | INTRAVENOUS | Status: AC
  Administered 2021-02-22: 1000 mg via INTRAVENOUS
  Filled 2021-02-22: qty 100

## 2021-02-22 MED ORDER — FENTANYL CITRATE (PF) 100 MCG/2ML IJ SOLN
INTRAMUSCULAR | Status: DC | PRN
  Administered 2021-02-22 (×2): 25 ug via INTRAVENOUS

## 2021-02-22 MED ORDER — VENLAFAXINE HCL ER 37.5 MG PO CP24
37.5000 mg | ORAL_CAPSULE | Freq: Every day | ORAL | Status: DC
  Administered 2021-02-23: 37.5 mg via ORAL
  Filled 2021-02-22 (×2): qty 1

## 2021-02-22 MED ORDER — SODIUM CHLORIDE 0.9 % IV SOLN: INTRAVENOUS | Status: DC

## 2021-02-22 MED ORDER — ONDANSETRON HCL 4 MG PO TABS: 4.0000 mg | ORAL_TABLET | Freq: Four times a day (QID) | ORAL | Status: DC | PRN

## 2021-02-22 MED ORDER — METHOCARBAMOL 1000 MG/10ML IJ SOLN
500.0000 mg | Freq: Four times a day (QID) | INTRAVENOUS | Status: DC | PRN
  Filled 2021-02-22: qty 5

## 2021-02-22 MED ORDER — AMISULPRIDE (ANTIEMETIC) 5 MG/2ML IV SOLN: 5.0000 mg | Freq: Once | INTRAVENOUS | Status: DC

## 2021-02-22 MED ORDER — BUPIVACAINE-MELOXICAM ER 400-12 MG/14ML IJ SOLN
INTRAMUSCULAR | Status: DC | PRN
  Administered 2021-02-22: 400 mg

## 2021-02-22 MED ORDER — HYDROMORPHONE HCL 1 MG/ML IJ SOLN
INTRAMUSCULAR | Status: AC
  Filled 2021-02-22: qty 1

## 2021-02-22 MED ORDER — OXYCODONE HCL 5 MG PO TABS
5.0000 mg | ORAL_TABLET | ORAL | Status: DC | PRN
  Administered 2021-02-22 – 2021-02-23 (×5): 10 mg via ORAL
  Filled 2021-02-22 (×5): qty 2

## 2021-02-22 SURGICAL SUPPLY — 88 items
ADH SKN CLS APL DERMABOND .7 (GAUZE/BANDAGES/DRESSINGS) ×1
ADH SKN CLS LQ APL DERMABOND (GAUZE/BANDAGES/DRESSINGS) ×1
ALCOHOL 70% 16 OZ (MISCELLANEOUS) ×2 IMPLANT
BAG COUNTER SPONGE SURGICOUNT (BAG) IMPLANT
BAG DECANTER FOR FLEXI CONT (MISCELLANEOUS) ×2 IMPLANT
BAG SPNG CNTER NS LX DISP (BAG)
BANDAGE ESMARK 6X9 LF (GAUZE/BANDAGES/DRESSINGS) IMPLANT
BLADE SAG 18X100X1.27 (BLADE) ×2 IMPLANT
BNDG CMPR 9X6 STRL LF SNTH (GAUZE/BANDAGES/DRESSINGS)
BNDG ESMARK 6X9 LF (GAUZE/BANDAGES/DRESSINGS)
BOWL SMART MIX CTS (DISPOSABLE) ×2 IMPLANT
BSPLAT TIB 5D D CMNT STM RT (Knees) ×1 IMPLANT
CATH FOLEY 2WAY SLVR  5CC 14FR (CATHETERS) ×2
CATH FOLEY 2WAY SLVR 5CC 14FR (CATHETERS) IMPLANT
CEMENT BONE REFOBACIN R1X40 US (Cement) ×2 IMPLANT
CLSR STERI-STRIP ANTIMIC 1/2X4 (GAUZE/BANDAGES/DRESSINGS) ×4 IMPLANT
COMP FEM CR CEMT STD SZ5 (Joint) ×2 IMPLANT
COMPONENT FEM CR CEMT STD SZ5 (Joint) IMPLANT
COOLER ICEMAN CLASSIC (MISCELLANEOUS) ×3 IMPLANT
COVER SURGICAL LIGHT HANDLE (MISCELLANEOUS) ×2 IMPLANT
CUFF TOURN SGL QUICK 34 (TOURNIQUET CUFF) ×2
CUFF TOURN SGL QUICK 42 (TOURNIQUET CUFF) IMPLANT
CUFF TRNQT CYL 34X4.125X (TOURNIQUET CUFF) ×1 IMPLANT
DERMABOND ADHESIVE PROPEN (GAUZE/BANDAGES/DRESSINGS) ×1
DERMABOND ADVANCED (GAUZE/BANDAGES/DRESSINGS) ×1
DERMABOND ADVANCED .7 DNX12 (GAUZE/BANDAGES/DRESSINGS) ×1 IMPLANT
DERMABOND ADVANCED .7 DNX6 (GAUZE/BANDAGES/DRESSINGS) IMPLANT
DRAPE EXTREMITY T 121X128X90 (DISPOSABLE) ×2 IMPLANT
DRAPE HALF SHEET 40X57 (DRAPES) ×2 IMPLANT
DRAPE INCISE IOBAN 66X45 STRL (DRAPES) IMPLANT
DRAPE ORTHO SPLIT 77X108 STRL (DRAPES) ×4
DRAPE POUCH INSTRU U-SHP 10X18 (DRAPES) ×2 IMPLANT
DRAPE SURG ORHT 6 SPLT 77X108 (DRAPES) ×2 IMPLANT
DRAPE U-SHAPE 47X51 STRL (DRAPES) ×4 IMPLANT
DRSG AQUACEL AG ADV 3.5X10 (GAUZE/BANDAGES/DRESSINGS) ×2 IMPLANT
DURAPREP 26ML APPLICATOR (WOUND CARE) ×6 IMPLANT
ELECT CAUTERY BLADE 6.4 (BLADE) ×2 IMPLANT
ELECT REM PT RETURN 9FT ADLT (ELECTROSURGICAL) ×2
ELECTRODE REM PT RTRN 9FT ADLT (ELECTROSURGICAL) ×1 IMPLANT
GLOVE SURG LTX SZ7 (GLOVE) ×6 IMPLANT
GLOVE SURG NEOP MICRO LF SZ7.5 (GLOVE) ×6 IMPLANT
GLOVE SURG SYN 7.5  E (GLOVE) ×8
GLOVE SURG SYN 7.5 E (GLOVE) ×4 IMPLANT
GLOVE SURG SYN 7.5 PF PI (GLOVE) ×4 IMPLANT
GLOVE SURG UNDER POLY LF SZ7 (GLOVE) ×10 IMPLANT
GOWN STRL REIN XL XLG (GOWN DISPOSABLE) ×2 IMPLANT
GOWN STRL REUS W/ TWL LRG LVL3 (GOWN DISPOSABLE) ×1 IMPLANT
GOWN STRL REUS W/TWL LRG LVL3 (GOWN DISPOSABLE) ×2
HANDPIECE INTERPULSE COAX TIP (DISPOSABLE) ×2
HDLS TROCR DRIL PIN KNEE 75 (PIN) ×2
HOOD PEEL AWAY FLYTE STAYCOOL (MISCELLANEOUS) ×4 IMPLANT
INSERT TIB ASF 11 4-5/CD RT (Insert) ×1 IMPLANT
JET LAVAGE IRRISEPT WOUND (IRRIGATION / IRRIGATOR) ×2
KIT BASIN OR (CUSTOM PROCEDURE TRAY) ×2 IMPLANT
KIT TURNOVER KIT B (KITS) ×2 IMPLANT
LAVAGE JET IRRISEPT WOUND (IRRIGATION / IRRIGATOR) ×1 IMPLANT
MANIFOLD NEPTUNE II (INSTRUMENTS) ×2 IMPLANT
MARKER SKIN DUAL TIP RULER LAB (MISCELLANEOUS) ×2 IMPLANT
NDL SPNL 18GX3.5 QUINCKE PK (NEEDLE) ×2 IMPLANT
NEEDLE SPNL 18GX3.5 QUINCKE PK (NEEDLE) ×4 IMPLANT
NS IRRIG 1000ML POUR BTL (IV SOLUTION) ×2 IMPLANT
PACK TOTAL JOINT (CUSTOM PROCEDURE TRAY) ×2 IMPLANT
PAD ARMBOARD 7.5X6 YLW CONV (MISCELLANEOUS) ×4 IMPLANT
PAD COLD SHLDR WRAP-ON (PAD) ×2 IMPLANT
PIN DRILL HDLS TROCAR 75 4PK (PIN) IMPLANT
SAW OSC TIP CART 19.5X105X1.3 (SAW) ×2 IMPLANT
SCREW FEMALE HEX FIX 25X2.5 (ORTHOPEDIC DISPOSABLE SUPPLIES) ×1 IMPLANT
SET HNDPC FAN SPRY TIP SCT (DISPOSABLE) ×1 IMPLANT
STAPLER VISISTAT 35W (STAPLE) IMPLANT
STEM POLY PAT PLY 29M KNEE (Knees) ×1 IMPLANT
STEM TIBIA 5 DEG SZ D R KNEE (Knees) IMPLANT
SUCTION FRAZIER HANDLE 10FR (MISCELLANEOUS) ×2
SUCTION TUBE FRAZIER 10FR DISP (MISCELLANEOUS) ×1 IMPLANT
SUT ETHILON 2 0 FS 18 (SUTURE) ×1 IMPLANT
SUT MNCRL AB 4-0 PS2 18 (SUTURE) IMPLANT
SUT VIC AB 0 CT1 27 (SUTURE) ×4
SUT VIC AB 0 CT1 27XBRD ANBCTR (SUTURE) ×2 IMPLANT
SUT VIC AB 1 CTX 27 (SUTURE) ×5 IMPLANT
SUT VIC AB 2-0 CT1 27 (SUTURE) ×8
SUT VIC AB 2-0 CT1 TAPERPNT 27 (SUTURE) ×4 IMPLANT
SYR 50ML LL SCALE MARK (SYRINGE) ×4 IMPLANT
TIBIA STEM 5 DEG SZ D R KNEE (Knees) ×2 IMPLANT
TOWEL GREEN STERILE (TOWEL DISPOSABLE) ×2 IMPLANT
TOWEL GREEN STERILE FF (TOWEL DISPOSABLE) ×2 IMPLANT
TRAY CATH 16FR W/PLASTIC CATH (SET/KITS/TRAYS/PACK) IMPLANT
UNDERPAD 30X36 HEAVY ABSORB (UNDERPADS AND DIAPERS) ×2 IMPLANT
WRAP KNEE MAXI GEL POST OP (GAUZE/BANDAGES/DRESSINGS) ×2 IMPLANT
YANKAUER SUCT BULB TIP NO VENT (SUCTIONS) ×2 IMPLANT

## 2021-02-22 NOTE — Progress Notes (Signed)
   Subjective:  Patient reports pain as moderate.  Was able to participate with 2nd PT session.  Objective:   VITALS:   Vitals:   02/22/21 1210 02/22/21 1242 02/22/21 1628 02/22/21 1935  BP: (!) 160/88 134/82 138/82 133/65  Pulse: 74 68 73 77  Resp: '12 18 16 16  '$ Temp:  97.9 F (36.6 C) 98.2 F (36.8 C) 97.8 F (36.6 C)  TempSrc:  Oral Oral Oral  SpO2: 94% 96% 97% 95%  Weight:      Height:        Neurologically intact Sensation intact distally Intact pulses distally Dorsiflexion/Plantar flexion intact   Lab Results  Component Value Date   WBC 6.2 02/15/2021   HGB 13.2 02/15/2021   HCT 41.0 02/15/2021   MCV 86.9 02/15/2021   PLT 241 02/15/2021     Assessment/Plan:  Day of Surgery   - Expected postop acute blood loss anemia - will monitor for symptoms - continue to mobilize with PT - DVT ppx - SCDs, ambulation, aspirin - WBAT operative extremity - Pain control - plan is for d/c home tomorrow when she clears PT  Eduard Roux 02/22/2021, 8:38 PM [phone]

## 2021-02-22 NOTE — Anesthesia Procedure Notes (Signed)
Spinal  Patient location during procedure: OR Start time: 02/22/2021 7:24 AM End time: 02/22/2021 7:29 AM Reason for block: surgical anesthesia Staffing Performed: anesthesiologist  Anesthesiologist: Lynda Rainwater, MD Preanesthetic Checklist Completed: patient identified, IV checked, site marked, risks and benefits discussed, surgical consent, monitors and equipment checked, pre-op evaluation and timeout performed Spinal Block Patient position: sitting Prep: DuraPrep Patient monitoring: heart rate, cardiac monitor, continuous pulse ox and blood pressure Approach: midline Location: L3-4 Injection technique: single-shot Needle Needle type: Quincke  Needle gauge: 22 G Needle length: 9 cm Assessment Sensory level: T4 Events: CSF return

## 2021-02-22 NOTE — Progress Notes (Signed)
Physical Therapy Evaluation Patient Details Name: Judith Chavez MRN: [id number] DOB: 04/05/1946 Today's Date: 02/22/2021   History of Present Illness  Pt is a 75yo female presenting s/p R TKA on 8/15. PMH: anxiety, history of breast cancer, depression, DM, migraines, L foot numbness.   Clinical Impression  Pt presents s/p surgical procedure above. Pt reporting high level of pain which limited mobility. Required min assist for bed mobility for LE advancement; also increased time and multimodal cuing for hand placement and pursed lip breathing. Pt was able to tolerate several minutes sitting EOB with knee in approximately 40deg flexion via visual approximation. Educated on knee precautions and use of incentive spirometer. We will continue to follow her acutely to promote independence with functional mobility.      Follow Up Recommendations Follow surgeon's recommendation for DC plan and follow-up therapies    Equipment Recommendations  3in1 (PT);Rolling walker with 5" wheels    Recommendations for Other Services       Precautions / Restrictions Precautions Precautions: Knee Precaution Booklet Issued: No Precaution Comments: Educated on no pillow under knee; incentive spirometry. Restrictions Weight Bearing Restrictions: Yes RLE Weight Bearing: Weight bearing as tolerated      Mobility  Bed Mobility Overal bed mobility: Needs Assistance Bed Mobility: Supine to Sit;Sit to Supine     Supine to sit: Min assist Sit to supine: Min assist   General bed mobility comments: Pt required min assist for LE advancement only; multimodal cuing for hand placement and breathing to reduce anxiety. Further mobility tasks deferred secondary to pain, dizziness, and nausea. Educated on breathing techniques to help with symptom management.    Transfers                 General transfer comment: transfers not attempted secondary to pain level, dizziness, and nausea  Ambulation/Gait                 Stairs            Wheelchair Mobility    Modified Rankin (Stroke Patients Only)       Balance Overall balance assessment: Needs assistance Sitting-balance support: Single extremity supported;Feet supported Sitting balance-Leahy Scale: Poor Sitting balance - Comments: Reliant on at least 1 UE support                                     Pertinent Vitals/Pain Pain Assessment: 0-10 Pain Score: 8  Pain Location: right knee Pain Descriptors / Indicators: Operative site guarding;Aching;Discomfort;Grimacing;Guarding Pain Intervention(s): Limited activity within patient's tolerance;Monitored during session;Repositioned    Home Living Family/patient expects to be discharged to:: Private residence Living Arrangements: Spouse/significant other (Husband [name]) Available Help at Discharge: Family;Available 24 hours/day Type of Home: House Home Access: Stairs to enter Entrance Stairs-Rails: None Entrance Stairs-Number of Steps: 1 Home Layout: Two level Home Equipment: None      Prior Function Level of Independence: Independent               Hand Dominance        Extremity/Trunk Assessment   Upper Extremity Assessment Upper Extremity Assessment: Overall WFL for tasks assessed    Lower Extremity Assessment Lower Extremity Assessment: Generalized weakness;RLE deficits/detail;LLE deficits/detail RLE Deficits / Details: RLE consistent  with post op pain and weakness s/p RTKA RLE Sensation: WNL LLE Deficits / Details: L knee demonstrated increased swelling L>R LLE Sensation: WNL    Cervical / Trunk Assessment  Cervical / Trunk Assessment: Kyphotic  Communication   Communication: No difficulties  Cognition Arousal/Alertness: Awake/alert Behavior During Therapy: Anxious Overall Cognitive Status: Within Functional Limits for tasks assessed                                 General Comments: Pt generally anxious about her  pain; pursed lip breathing was helpful in reducing anxiety.      General Comments      Exercises Total Joint Exercises Ankle Circles/Pumps: 10 reps;Right;AROM   Assessment/Plan    PT Assessment Patient needs continued PT services  PT Problem List Decreased strength;Decreased range of motion;Decreased activity tolerance;Decreased balance;Decreased mobility;Decreased knowledge of use of DME;Decreased knowledge of precautions;Pain       PT Treatment Interventions DME instruction;Gait training;Stair training;Functional mobility training;Therapeutic activities;Therapeutic exercise;Balance training;Patient/family education    PT Goals (Current goals can be found in the Care Plan section)  Acute Rehab PT Goals Patient Stated Goal: to reduce pain PT Goal Formulation: With patient Time For Goal Achievement: 03/08/21 Potential to Achieve Goals: Good    Frequency 7X/week   Barriers to discharge        Co-evaluation               AM-PAC PT "6 Clicks" Mobility  Outcome Measure Help needed turning from your back to your side while in a flat bed without using bedrails?: A Little Help needed moving from lying on your back to sitting on the side of a flat bed without using bedrails?: A Little Help needed moving to and from a bed to a chair (including a wheelchair)?: A Little Help needed standing up from a chair using your arms (e.g., wheelchair or bedside chair)?: A Little Help needed to walk in hospital room?: A Little Help needed climbing 3-5 steps with a railing? : A Little 6 Click Score: 18    End of Session   Activity Tolerance: Patient limited by pain;Treatment limited secondary to medical complications (Comment) (dizziness, nausea) Patient left: in bed;with call bell/phone within reach Nurse Communication: Mobility status PT Visit Diagnosis: Pain;Muscle weakness (generalized) (M62.81);Other abnormalities of gait and mobility (R26.89) Pain - Right/Left: Right Pain - part  of body: Knee    Time: [id number] PT Time Calculation (min) (ACUTE ONLY): 18 min   Charges:   PT Evaluation $PT Eval Low Complexity: 1 Low          Dawayne Cirri, SPT Dawayne Cirri 02/22/2021, 5:14 PM

## 2021-02-22 NOTE — Anesthesia Procedure Notes (Signed)
Procedure Name: LMA Insertion Date/Time: 02/22/2021 8:03 AM Performed by: Griffin Dakin, CRNA Pre-anesthesia Checklist: Patient identified, Emergency Drugs available, Suction available, Patient being monitored and Timeout performed Patient Re-evaluated:Patient Re-evaluated prior to induction Oxygen Delivery Method: Circle system utilized Preoxygenation: Pre-oxygenation with 100% oxygen Induction Type: IV induction LMA: LMA inserted LMA Size: 4.0 Placement Confirmation: positive ETCO2 and breath sounds checked- equal and bilateral Tube secured with: Tape Dental Injury: Teeth and Oropharynx as per pre-operative assessment

## 2021-02-22 NOTE — Op Note (Signed)
Total Knee Arthroplasty Procedure Note  Preoperative diagnosis: Right knee osteoarthritis  Postoperative diagnosis:same  Operative procedure: Right total knee arthroplasty. CPT [phone]  Surgeon: N. Eduard Roux, MD  Assist: Judith Chavez, RNFA  Anesthesia: Spinal, regional, local  Tourniquet time: see anesthesia record  Implants used: Zimmer persona Femur: CR 5 Tibia: D Patella: 29 mm Polyethylene: 11 mm, MC  Indication: Judith Chavez is a 75 y.o. year old female with a history of knee pain. Having failed conservative management, the patient elected to proceed with a total knee arthroplasty.  We have reviewed the risk and benefits of the surgery and they elected to proceed after voicing understanding.  Procedure:  After informed consent was obtained and understanding of the risk were voiced including but not limited to bleeding, infection, damage to surrounding structures including nerves and vessels, blood clots, leg length inequality and the failure to achieve desired results, the operative extremity was marked with verbal confirmation of the patient in the holding area.   The patient was then brought to the operating room and transported to the operating room table in the supine position.  A tourniquet was applied to the operative extremity around the upper thigh. The operative limb was then prepped and draped in the usual sterile fashion and preoperative antibiotics were administered.  A time out was performed prior to the start of surgery confirming the correct extremity, preoperative antibiotic administration, as well as team members, implants and instruments available for the case. Correct surgical site was also confirmed with preoperative radiographs. The limb was then elevated for exsanguination and the tourniquet was inflated. A midline incision was made and a standard medial parapatellar approach was performed.  The infrapatellar fat pad was removed.  Suprapatellar synovium was  removed to reveal the anterior distal femoral cortex.  A medial peel was performed to release the capsule of the medial tibial plateau.  The patella was then everted which showed severe wear on the posterior surface with large osteophytes and was prepared and sized to a 29 mm.  A cover was placed on the patella for protection from retractors.  The knee was then brought into full flexion and we then turned our attention to the femur.  The cruciates were sacrificed.  The femoral condyles showed severe wear worse on the lateral side.  Start site was drilled in the femur and the intramedullary distal femoral cutting guide was placed, set at 5 degrees valgus, taking 10 mm of distal resection. The distal cut was made. Osteophytes were then removed.  Next, the proximal tibial cutting guide was placed with appropriate slope, varus/valgus alignment and depth of resection. The proximal tibial cut was made. Gap blocks were then used to assess the extension gap and alignment, and appropriate soft tissue releases were performed. Attention was turned back to the femur, which was sized using the sizing guide to a size 5. Appropriate rotation of the femoral component was determined using epicondylar axis, Whiteside's line, and assessing the flexion gap under ligament tension. The appropriate size 4-in-1 cutting block was placed and checked with an angel wing and cuts were made. Posterior femoral osteophytes and uncapped bone were then removed with the curved osteotome.  Trial components were placed, and stability was checked in full extension, mid-flexion, and deep flexion. Proper tibial rotation was determined and marked.  The patella tracked well without a lateral release.  The femoral lugs were then drilled. Trial components were then removed and tibial preparation performed.  The tibia was sized for  a size D component.   The bony surfaces were irrigated with a pulse lavage and then dried. Bone cement was vacuum mixed on the  back table, and the final components sized above were cemented into place.  Antibiotic irrigation was placed in the knee joint and soft tissues while the cement cured.  After cement had finished curing, excess cement was removed. The stability of the construct was re-evaluated throughout a range of motion and found to be acceptable. The trial liner was removed, the knee was copiously irrigated, and the knee was re-evaluated for any excess bone debris. The real polyethylene liner, 11 mm thick, was inserted and checked to ensure the locking mechanism had engaged appropriately. The tourniquet was deflated and hemostasis was achieved. The wound was irrigated with normal saline.  One gram of vancomycin powder was placed in the surgical bed.  Capsular closure was performed with a #1 vicryl, subcutaneous fat closed with a 0 vicryl suture, then subcutaneous tissue closed with interrupted 2.0 vicryl suture. The skin was then closed with a 2.0 nylon and dermabond. A sterile dressing was applied.  The patient was awakened in the operating room and taken to recovery in stable condition. All sponge, needle, and instrument counts were correct at the end of the case.  Judith Chavez was necessary for opening, closing, retracting, limb positioning and overall facilitation and completion of the surgery.  Position: supine  Complications: none.  Time Out: performed   Drains/Packing: none  Estimated blood loss: minimal  Returned to Recovery Room: in good condition.   Antibiotics: yes   Mechanical VTE (DVT) Prophylaxis: sequential compression devices, TED thigh-high  Chemical VTE (DVT) Prophylaxis: aspirin  Fluid Replacement  Crystalloid: see anesthesia record Blood: none  FFP: none   Specimens Removed: 1 to pathology   Sponge and Instrument Count Correct? yes   PACU: portable radiograph - knee AP and Lateral   Plan/RTC: Return in 2 weeks for wound check.   Weight Bearing/Load Lower Extremity: full    Implant Name Type Inv. Item Serial No. Manufacturer Lot No. LRB No. Used Action  CEMENT BONE REFOBACIN [id number] [place] - [id number] Cement CEMENT BONE REFOBACIN R1X40 [place]  [employer]MER [employer](ORTH,TRAU,BIO,SG) [id number] Right 1 Implanted  CEMENT BONE REFOBACIN R1X40 [place] - [id number] Cement CEMENT BONE REFOBACIN R1X40 [place]  ZIMMER RECON(ORTH,TRAU,BIO,SG) [id number] Right 1 Implanted  TIBIA STEM 5 DEG SZ D R KNEE - [id number] Knees TIBIA STEM 5 DEG SZ D R KNEE  ZIMMER RECON(ORTH,TRAU,BIO,SG) [id number] Right 1 Implanted  STEM POLY PAT PLY 35M KNEE - [id number] Knees STEM POLY PAT PLY 35M KNEE  ZIMMER RECON(ORTH,TRAU,BIO,SG) [id number] Right 1 Implanted  COMP FEM CR CEMT STD SZ5 - [id number] Joint COMP FEM CR CEMT STD SZ5  ZIMMER [employer]ON(ORTH,TRAU,BIO,SG) [id number] Right 1 Implanted  INSERT TIB ASF 11 4-5/CD RT - [id number] Insert INSERT TIB ASF 11 4-5/CD RT  ZIMMER RECON(ORTH,TRAU,BIO,SG) [id number] Right 1 Implanted    N. Eduard Roux, MD [facility] 9:14 AM

## 2021-02-22 NOTE — Progress Notes (Signed)
PT Cancellation Note  Patient Details Name: Judith Chavez MRN: [id number] DOB: 1946-01-18   Cancelled Treatment:    Reason Eval/Treat Not Completed: Fatigue/lethargy limiting ability to participate Pt very lethargic following surgery. Will follow up as schedule allows.   [name], DPT  Acute Rehabilitation Services  Pager: [phone] Office: [phone]    [name] 02/22/2021, 1:45 PM

## 2021-02-22 NOTE — Anesthesia Postprocedure Evaluation (Signed)
Anesthesia Post Note  Patient: Judith Chavez  Procedure(s) Performed: RIGHT TOTAL KNEE ARTHROPLASTY (Right: Knee)     Patient location during evaluation: PACU Anesthesia Type: General Level of consciousness: awake and alert Pain management: pain level controlled Vital Signs Assessment: post-procedure vital signs reviewed and stable Respiratory status: spontaneous breathing, nonlabored ventilation and respiratory function stable Cardiovascular status: blood pressure returned to baseline and stable Postop Assessment: no apparent nausea or vomiting Anesthetic complications: no   No notable events documented.  Last Vitals:  Vitals:   02/22/21 1040 02/22/21 1055  BP: 129/90 (!) 136/94  Pulse: 73 74  Resp: 16 (!) 24  Temp:    SpO2: 96% 99%    Last Pain:  Vitals:   02/22/21 1040  TempSrc:   PainSc: Asleep                 Lynda Rainwater

## 2021-02-22 NOTE — Care Plan (Signed)
Ortho Bundle Case Management Note  Patient Details  Name: Judith Chavez MRN: [id number] Date of Birth: 07-13-1945  [facility] call to patient to discuss her upcoming Right total knee arthroplasty with Dr. Erlinda Hong on 02/22/21. She is an Ortho bundle patient through [facility] and is agreeable to case management. She lives with her husband, who will be assisting at home after discharge. She has all DME needed post-op (CPM, FWW, 3in1/BSC) delivered by [employer] to her home prior to surgery. Anticipate HHPT will be needed after a short hospital stay. Referral made to [facility] after choice provided. Reviewed all post-op care instructions. Will continue to follow for needs.                  DME Arranged:  3-N-1, CPM, Walker rolling DME Agency:  [employer]  HH Arranged:  PT HH Agency:  [place]  Additional Comments: Please contact me with any questions of if this plan should need to change.  Jamse Arn, RN, BSN, [employer]  [phone] 02/22/2021, 4:34 PM

## 2021-02-22 NOTE — H&P (Signed)
PREOPERATIVE H&P  Chief Complaint: right knee degenerative joint disease  HPI: Judith Chavez is a 75 y.o. female who presents for surgical treatment of right knee degenerative joint disease.  She denies any changes in medical history.  Past Medical History:  Diagnosis Date   Anemia    as a teenager   Anxiety    Arthritis    knees   Cancer ([place]) 03/2020   right breast DCIS, LCIS   Depression    Diabetes mellitus without complication ([place])    Type 2   Family history of breast cancer 02/28/2020   Headache    migraines   History of pneumonia    Numbness    "left foot"    Pneumonia    Poor circulation    Past Surgical History:  Procedure Laterality Date   ABCESS DRAINAGE     "after cesarean- in hospital for 3 months"   ABDOMINAL HYSTERECTOMY     BACK SURGERY     CESAREAN SECTION  07/11/1968   CHOLECYSTECTOMY     COLONOSCOPY     DILATION AND CURETTAGE OF UTERUS     EYE SURGERY Right    MENISCUS REPAIR Bilateral    SIMPLE MASTECTOMY WITH AXILLARY SENTINEL NODE BIOPSY Right 04/13/2020   Procedure: RIGHT MASTECTOMY WITH RIGHT AXILLARY SENTINEL NODE BIOPSY;  Surgeon: Coralie Keens, MD;  Location: [facility];  Service: General;  Laterality: Right;   SPINE SURGERY     Social History   Socioeconomic History   Marital status: Married    Spouse name: Not on file   Number of children: 1   Years of education: Not on file   Highest education level: Not on file  Occupational History   Occupation: retired   Tobacco Use   Smoking status: Former    Packs/day: 0.50    Years: 10.00    Pack years: 5.00    Types: Cigarettes    Quit date: 07/11/1986    Years since quitting: 34.6   Smokeless tobacco: Never  Vaping Use   Vaping Use: Never used  Substance and Sexual Activity   Alcohol use: Yes    Comment: occassionally   Drug use: No   Sexual activity: Not on file  Other Topics Concern   Not on file  Social History Narrative   Not on file   Social  Determinants of Health   Financial Resource Strain: Not on file  Food Insecurity: Not on file  Transportation Needs: Not on file  Physical Activity: Not on file  Stress: Not on file  Social Connections: Not on file   Family History  Problem Relation Age of Onset   Breast cancer Cousin        maternal; dx and d. <50   Breast cancer Cousin        maternal; dx and d. <50   Breast cancer Cousin        maternal; dx and d. <50   Breast cancer Cousin        maternal; dx and d. <50   Cancer Paternal Aunt        unknown type cancer dx 25s   No Known Allergies Prior to Admission medications   Medication Sig Start Date End Date Taking? Authorizing Provider  acyclovir (ZOVIRAX) 800 MG tablet Take 800 mg by mouth 2 (two) times daily as needed (fever blisters). Take for 5 days   Yes [provider]  ALPRAZolam (XANAX) 0.5 MG tablet Take 0.5 mg  by mouth 2 (two) times daily as needed for anxiety. 06/24/19  Yes [provider]  aspirin EC 81 MG tablet Take 1 tablet (81 mg total) by mouth 2 (two) times daily. To be taken after surgery as needed 01/26/21   Aundra Dubin, PA-C  Cholecalciferol (DIALYVITE VITAMIN D 5000) 125 MCG (5000 UT) capsule Take 5,000 Units by mouth daily.   Yes [provider]  docusate sodium (COLACE) 100 MG capsule Take 1 capsule (100 mg total) by mouth daily as needed. Patient taking differently: Take 100 mg by mouth daily as needed for mild constipation. 01/26/21 01/26/22  Aundra Dubin, PA-C  ibuprofen (ADVIL) 200 MG tablet Take 200 mg by mouth every 6 (six) hours as needed for headache or cramping.   Yes [provider]  methocarbamol (ROBAXIN) 500 MG tablet Take 1 tablet (500 mg total) by mouth 2 (two) times daily as needed. Patient taking differently: Take 500 mg by mouth 2 (two) times daily as needed for muscle spasms. 01/26/21   Aundra Dubin, PA-C  ondansetron (ZOFRAN) 4 MG tablet Take 1 tablet (4 mg total) by mouth every 8  (eight) hours as needed for nausea or vomiting. 01/26/21   Aundra Dubin, PA-C  oxyCODONE-acetaminophen (PERCOCET) 5-325 MG tablet Take 1-2 tablets by mouth every 6 (six) hours as needed. To be taken after surgery as needed Patient taking differently: Take 1-2 tablets by mouth every 6 (six) hours as needed for moderate pain. To be taken after surgery as needed 01/26/21   Aundra Dubin, PA-C  polyethylene glycol (MIRALAX / GLYCOLAX) 17 g packet Take 17 g by mouth daily as needed for moderate constipation.   Yes [provider]  sitaGLIPtin (JANUVIA) 100 MG tablet Take 100 mg by mouth daily.   Yes [provider]  TRESIBA FLEXTOUCH 100 UNIT/ML SOPN FlexTouch Pen Inject 30 Units into the skin daily. 07/01/19  Yes [provider]  NOVOFINE 32G X 6 MM MISC AS DIRECTED ONCE A DAY WITH TRESIBA [facility] 07/01/19   [provider]  [facility] DELICA LANCETS [id number] MISC 2 (two) times daily. for testing 12/14/15   [provider]  [facility] VERIO test strip USE AS DIRECTED TO TEST BLOOD SUGAR TWICE A DAY 12/09/15   [provider]  tamoxifen (NOLVADEX) 20 MG tablet Take 1 tablet (20 mg total) by mouth daily. 07/30/20   Alla Feeling, NP  venlafaxine XR (EFFEXOR-XR) 37.5 MG 24 hr capsule Take 1 capsule (37.5 mg total) by mouth daily with breakfast. Take with 75 mg tab 02/17/21   Alla Feeling, NP  venlafaxine XR (EFFEXOR-XR) 75 MG 24 hr capsule TAKE 1 CAPSULE (75 MG TOTAL) BY MOUTH DAILY WITH BREAKFAST. TAKE WITH 37.5 MG TAB 02/17/21   Alla Feeling, NP     Positive ROS: All other systems have been reviewed and were otherwise negative with the exception of those mentioned in the HPI and as above.  Physical Exam: General: Alert, no acute distress Cardiovascular: No pedal edema Respiratory: No cyanosis, no use of accessory musculature GI: abdomen soft Skin: No lesions in the area of chief complaint Neurologic: Sensation intact distally Psychiatric:  Patient is competent for consent with normal mood and affect Lymphatic: no lymphedema  MUSCULOSKELETAL: exam stable  Assessment: right knee degenerative joint disease  Plan: Plan for Procedure(s): RIGHT TOTAL KNEE ARTHROPLASTY  The risks benefits and alternatives were discussed with the patient including but not limited to the risks of nonoperative treatment, versus  surgical intervention including infection, bleeding, nerve injury,  blood clots, cardiopulmonary complications, morbidity, mortality, among others, and they were willing to proceed.   Preoperative templating of the joint replacement has been completed, documented, and submitted to the Operating Room personnel in order to optimize intra-operative equipment management.   Eduard Roux, MD 02/22/2021 5:59 AM

## 2021-02-22 NOTE — Discharge Instructions (Signed)

## 2021-02-22 NOTE — Anesthesia Procedure Notes (Signed)
Procedure Name: MAC Date/Time: 02/22/2021 7:15 AM Performed by: Griffin Dakin, CRNA Pre-anesthesia Checklist: Patient identified, Emergency Drugs available, Suction available, Patient being monitored and Timeout performed Patient Re-evaluated:Patient Re-evaluated prior to induction Oxygen Delivery Method: Simple face mask Induction Type: IV induction Placement Confirmation: positive ETCO2 and breath sounds checked- equal and bilateral Dental Injury: Teeth and Oropharynx as per pre-operative assessment

## 2021-02-22 NOTE — Anesthesia Procedure Notes (Signed)
Anesthesia Regional Block: Adductor canal block   Pre-Anesthetic Checklist: , timeout performed,  Correct Patient, Correct Site, Correct Laterality,  Correct Procedure, Correct Position, site marked,  Risks and benefits discussed,  Surgical consent,  Pre-op evaluation,  At surgeon's request and post-op pain management  Laterality: Right  Prep: chloraprep       Needles:  Injection technique: Single-shot  Needle Type: Stimiplex     Needle Length: 9cm  Needle Gauge: 21     Additional Needles:   Procedures:,,,, ultrasound used (permanent image in chart),,    Narrative:  Start time: 02/22/2021 6:59 AM End time: 02/22/2021 7:04 AM Injection made incrementally with aspirations every 5 mL.  Performed by: Personally  Anesthesiologist: Lynda Rainwater, MD

## 2021-02-22 NOTE — Transfer of Care (Signed)
Immediate Anesthesia Transfer of Care Note  Patient: Judith Chavez  Procedure(s) Performed: RIGHT TOTAL KNEE ARTHROPLASTY (Right: Knee)  Patient Location: PACU  Anesthesia Type:MAC, General and Spinal  Level of Consciousness: awake, alert  and oriented  Airway & Oxygen Therapy: Patient Spontanous Breathing and Patient connected to face mask oxygen  Post-op Assessment: Report given to RN and Post -op Vital signs reviewed and stable  Post vital signs: Reviewed and stable  Last Vitals:  Vitals Value Taken Time  BP 123/81 02/22/21 1010  Temp 36.2 C 02/22/21 1010  Pulse 82 02/22/21 1015  Resp 21 02/22/21 1015  SpO2 100 % 02/22/21 1015  Vitals shown include unvalidated device data.  Last Pain:  Vitals:   02/22/21 1010  TempSrc:   PainSc: Asleep         Complications: No notable events documented.

## 2021-02-22 NOTE — Anesthesia Preprocedure Evaluation (Signed)
Anesthesia Evaluation  Patient identified by MRN, date of birth, ID band Patient awake    Reviewed: Allergy & Precautions, NPO status , Patient's Chart, lab work & pertinent test results  History of Anesthesia Complications Negative for: history of anesthetic complications  Airway Mallampati: III  TM Distance: >3 FB Neck ROM: Full    Dental no notable dental hx. (+) Dental Advisory Given   Pulmonary former smoker,    Pulmonary exam normal breath sounds clear to auscultation       Cardiovascular negative cardio ROS Normal cardiovascular exam Rhythm:Regular Rate:Normal     Neuro/Psych  Headaches, PSYCHIATRIC DISORDERS Anxiety Depression    GI/Hepatic negative GI ROS, Neg liver ROS,   Endo/Other  negative endocrine ROSdiabetes  Renal/GU negative Renal ROS  negative genitourinary   Musculoskeletal  (+) Arthritis , Osteoarthritis,  Pain in R leg mostly, also down L with associated numbness and weakness. Pain across lower back.    Abdominal (+) + obese,   Peds negative pediatric ROS (+)  Hematology negative hematology ROS (+)   Anesthesia Other Findings   Reproductive/Obstetrics negative OB ROS                             Anesthesia Physical  Anesthesia Plan  ASA: III  Anesthesia Plan: Spinal   Post-op Pain Management:  Regional for Post-op pain   Induction: Intravenous  PONV Risk Score and Plan: 2 and Ondansetron, Midazolam and Treatment may vary due to age or medical condition  Airway Management Planned: Simple Face Mask  Additional Equipment:   Intra-op Plan:   Post-operative Plan:   Informed Consent: I have reviewed the patients History and Physical, chart, labs and discussed the procedure including the risks, benefits and alternatives for the proposed anesthesia with the patient or authorized representative who has indicated his/her understanding and acceptance.      Dental advisory given  Plan Discussed with: CRNA  Anesthesia Plan Comments:         Anesthesia Quick Evaluation

## 2021-02-23 ENCOUNTER — Encounter (HOSPITAL_COMMUNITY): Payer: Self-pay | Admitting: Orthopaedic Surgery

## 2021-02-23 DIAGNOSIS — E119 Type 2 diabetes mellitus without complications: Secondary | ICD-10-CM | POA: Diagnosis not present

## 2021-02-23 DIAGNOSIS — Z87891 Personal history of nicotine dependence: Secondary | ICD-10-CM | POA: Diagnosis not present

## 2021-02-23 DIAGNOSIS — Z853 Personal history of malignant neoplasm of breast: Secondary | ICD-10-CM | POA: Diagnosis not present

## 2021-02-23 DIAGNOSIS — Z79899 Other long term (current) drug therapy: Secondary | ICD-10-CM | POA: Diagnosis not present

## 2021-02-23 DIAGNOSIS — M1711 Unilateral primary osteoarthritis, right knee: Secondary | ICD-10-CM | POA: Diagnosis not present

## 2021-02-23 DIAGNOSIS — Z01812 Encounter for preprocedural laboratory examination: Secondary | ICD-10-CM | POA: Diagnosis not present

## 2021-02-23 LAB — BASIC METABOLIC PANEL
Anion gap: 9 (ref 5–15)
BUN: 11 mg/dL (ref 8–23)
CO2: 22 mmol/L (ref 22–32)
Calcium: 8.9 mg/dL (ref 8.9–10.3)
Chloride: 102 mmol/L (ref 98–111)
Creatinine, Ser: 0.72 mg/dL (ref 0.44–1.00)
GFR, Estimated: >60 mL/min (ref >60)
Glucose, Bld: 340 mg/dL — ABNORMAL HIGH (ref 70–99)
Potassium: 4.2 mmol/L (ref 3.5–5.1)
Sodium: 133 mmol/L — ABNORMAL LOW (ref 135–145)

## 2021-02-23 LAB — GLUCOSE, CAPILLARY
Glucose-Capillary: 210 mg/dL — ABNORMAL HIGH (ref 70–99)
Glucose-Capillary: 266 mg/dL — ABNORMAL HIGH (ref 70–99)

## 2021-02-23 LAB — CBC
HCT: 38.3 % (ref 36.0–46.0)
Hemoglobin: 12.3 g/dL (ref 12.0–15.0)
MCH: 27.6 pg (ref 26.0–34.0)
MCHC: 32.1 g/dL (ref 30.0–36.0)
MCV: 86.1 fL (ref 80.0–100.0)
Platelets: 213 10*3/uL (ref 150–400)
RBC: 4.45 MIL/uL (ref 3.87–5.11)
RDW: 13.3 % (ref 11.5–15.5)
WBC: 11 10*3/uL — ABNORMAL HIGH (ref 4.0–10.5)
nRBC: 0 % (ref 0.0–0.2)

## 2021-02-23 NOTE — Progress Notes (Signed)
Physical Therapy Treatment Patient Details Name: Judith Chavez MRN: [id number] DOB: 1945/11/28 Today's Date: 02/23/2021    History of Present Illness Pt is a 75yo female presenting s/p R TKA on 8/15. PMH: anxiety, history of breast cancer, depression, DM, migraines, L foot numbness.    PT Comments    Pt supine in bed on entry, reports that she is feeling good this morning. Pt reports pain 7/10, but has had pain medication and is eager to work with therapy. Pt is min guard for bed mobility, transfers and ambulation of 400 feet with RW. With return to room, participated in seated exercise, when phlebotomist arrived. PT will follow back this afternoon, to practice steps and review HEP.     Follow Up Recommendations  Follow surgeon's recommendation for DC plan and follow-up therapies     Equipment Recommendations  3in1 (PT);Rolling walker with 5" wheels       Precautions / Restrictions Precautions Precautions: Knee Precaution Booklet Issued: No Precaution Comments: Educated on no pillow under knee; incentive spirometry. Restrictions Weight Bearing Restrictions: Yes RLE Weight Bearing: Weight bearing as tolerated    Mobility  Bed Mobility Overal bed mobility: Needs Assistance Bed Mobility: Supine to Sit;Sit to Supine     Supine to sit: Min guard     General bed mobility comments: min guard for safety, increased time and effort, and use of bedrail    Transfers Overall transfer level: Needs assistance   Transfers: Sit to/from Stand Sit to Stand: Min guard         General transfer comment: min guard for safety, vc for hand placement, good power up and self steadying  Ambulation/Gait Ambulation/Gait assistance: Min guard Gait Distance (Feet): 400 Feet Assistive device: Rolling walker (2 wheeled) Gait Pattern/deviations: Step-through pattern;Decreased weight shift to right;Decreased stance time - right;Decreased step length - left Gait velocity: slowed Gait velocity  interpretation: <1.8 ft/sec, indicate of risk for recurrent falls General Gait Details: min guard for slowed, steady gait, vc for relaxation especially in shoulders and grip on RW, increased R knee ROM with distance          Balance Overall balance assessment: Needs assistance Sitting-balance support: Feet supported;No upper extremity supported Sitting balance-Leahy Scale: Fair     Standing balance support: Single extremity supported;Bilateral upper extremity supported;During functional activity Standing balance-Leahy Scale: Poor Standing balance comment: requires at least singel UE support                            Cognition Arousal/Alertness: Awake/alert Behavior During Therapy: Anxious Overall Cognitive Status: Within Functional Limits for tasks assessed                                 General Comments: continues to be nervous about pain      Exercises Total Joint Exercises Ankle Circles/Pumps: 10 reps;Right;AROM Long Arc Quad: AROM;Strengthening;10 reps;Seated Knee Flexion: AAROM;Right;5 reps;Seated    General Comments  VSS on RA      Pertinent Vitals/Pain Pain Assessment: 0-10 Pain Score: 7  Pain Location: right knee Pain Descriptors / Indicators: Operative site guarding;Aching;Discomfort;Grimacing;Guarding Pain Intervention(s): Limited activity within patient's tolerance;Monitored during session;Repositioned     PT Goals (current goals can now be found in the care plan section) Acute Rehab PT Goals Patient Stated Goal: to reduce pain PT Goal Formulation: With patient Time For Goal Achievement: 03/08/21 Potential to Achieve Goals: Good Progress  towards PT goals: Progressing toward goals    Frequency    7X/week      PT Plan Current plan remains appropriate       AM-PAC PT "6 Clicks" Mobility   Outcome Measure  Help needed turning from your back to your side while in a flat bed without using bedrails?: A Little Help  needed moving from lying on your back to sitting on the side of a flat bed without using bedrails?: A Little Help needed moving to and from a bed to a chair (including a wheelchair)?: A Little Help needed standing up from a chair using your arms (e.g., wheelchair or bedside chair)?: A Little Help needed to walk in hospital room?: A Little Help needed climbing 3-5 steps with a railing? : A Little 6 Click Score: 18    End of Session Equipment Utilized During Treatment: Gait belt Activity Tolerance: Patient tolerated treatment well;No increased pain Patient left: with call bell/phone within reach;in chair;with nursing/sitter in room Nurse Communication: Mobility status PT Visit Diagnosis: Pain;Muscle weakness (generalized) (M62.81);Other abnormalities of gait and mobility (R26.89) Pain - Right/Left: Right Pain - part of body: Knee     Time: [id number] PT Time Calculation (min) (ACUTE ONLY): 38 min  Charges:  $Gait Training: 23-37 mins $Therapeutic Exercise: 8-22 mins                     Tyrone Balash B. Migdalia Dk PT, DPT Acute Rehabilitation Services Pager [phone] Office [phone]    [place] 02/23/2021, 9:39 AM

## 2021-02-23 NOTE — Care Management Obs Status (Signed)
[facility] NOTIFICATION   Patient Details  Name: Judith Chavez MRN: [id number] Date of Birth: [birth year]   Medicare Observation Status Notification Given:  Yes    Joanne Chars, LCSW 02/23/2021, 1:31 PM

## 2021-02-23 NOTE — Evaluation (Signed)
Occupational Therapy Evaluation Patient Details Name: Judith Chavez MRN: [id number] DOB: 1946-06-05 Today's Date: 02/23/2021    History of Present Illness 75yo female presenting s/p R TKA on 8/15. PMH: anxiety, history of breast cancer, depression, DM, migraines, L foot numbness.   Clinical Impression   PTA, pt was living with her husband and was independent. Currently, pt requires performing ADLs and functional mobility using RW at Supervision level. Provided education on LB ADLs, toileting, and shower transfer with with 3N1; pt demonstrated understanding. Answered all pt questions. Recommend dc home once medically stable per physician. All acute OT needs met and will sign off. Thank you.    Follow Up Recommendations  No OT follow up    Equipment Recommendations  None recommended by OT    Recommendations for Other Services PT consult     Precautions / Restrictions Precautions Precautions: Knee Precaution Booklet Issued: No Precaution Comments: Educated on no pillow under knee; incentive spirometry. Restrictions Weight Bearing Restrictions: Yes RLE Weight Bearing: Weight bearing as tolerated      Mobility Bed Mobility Overal bed mobility: Needs Assistance Bed Mobility: Sit to Supine     Supine to sit: Min guard Sit to supine: Supervision   General bed mobility comments: Supervision for safety    Transfers Overall transfer level: Needs assistance   Transfers: Sit to/from Stand Sit to Stand: Supervision         General transfer comment: supervision for safety    Balance Overall balance assessment: Needs assistance Sitting-balance support: Feet supported;No upper extremity supported Sitting balance-Leahy Scale: Good Sitting balance - Comments: bending forward for LB dressing   Standing balance support: Single extremity supported;Bilateral upper extremity supported;During functional activity;No upper extremity supported Standing balance-Leahy Scale:  Fair Standing balance comment: able to perform hand hygiene without UE support                           ADL either performed or assessed with clinical judgement   ADL Overall ADL's : Needs assistance/impaired                                       General ADL Comments: Providing education on compensatory techniques for LB dressing, toileting, and shower transfer. Pt demonstrating understanding and performance at Supervision level     Vision         Perception     Praxis      Pertinent Vitals/Pain Pain Assessment: Faces Pain Score: 7  Faces Pain Scale: Hurts even more Pain Location: right knee Pain Descriptors / Indicators: Operative site guarding;Aching;Discomfort;Grimacing;Guarding Pain Intervention(s): Monitored during session;Limited activity within patient's tolerance;Repositioned     Hand Dominance Right   Extremity/Trunk Assessment Upper Extremity Assessment Upper Extremity Assessment: Overall WFL for tasks assessed   Lower Extremity Assessment Lower Extremity Assessment: Defer to PT evaluation RLE Deficits / Details: RLE consistent  with post op pain and weakness s/p RTKA RLE Sensation: WNL LLE Deficits / Details: L knee demonstrated increased swelling L>R LLE Sensation: WNL   Cervical / Trunk Assessment Cervical / Trunk Assessment: Kyphotic   Communication Communication Communication: No difficulties   Cognition Arousal/Alertness: Awake/alert Behavior During Therapy: WFL for tasks assessed/performed Overall Cognitive Status: Within Functional Limits for tasks assessed  General Comments: Becoming slightly drowsy by end of session - suspect due to pain medication   General Comments       Exercises Exercises: Total Joint Total Joint Exercises Ankle Circles/Pumps: 10 reps;Right;AROM Long Arc Quad: AROM;Strengthening;10 reps;Seated Knee Flexion: AAROM;Right;5 reps;Seated    Shoulder Instructions      Home Living Family/patient expects to be discharged to:: Private residence Living Arrangements: Spouse/significant other (Husband Judith Chavez) Available Help at Discharge: Family;Available 24 hours/day Type of Home: House Home Access: Stairs to enter [employer] of Steps: 1 Entrance Stairs-Rails: None Home Layout: Two level Alternate Level Stairs-Number of Steps: 13 Alternate Level Stairs-Rails: Right Bathroom Shower/Tub: Tub/shower unit;Walk-in shower   Bathroom Toilet: Standard Bathroom Accessibility: Yes   Home Equipment: None          Prior Functioning/Environment Level of Independence: Independent                 OT Problem List: Decreased strength;Decreased range of motion;Decreased activity tolerance;Impaired balance (sitting and/or standing);Decreased knowledge of use of DME or AE;Decreased knowledge of precautions;Pain      OT Treatment/Interventions:      OT Goals(Current goals can be found in the care plan section) Acute Rehab OT Goals Patient Stated Goal: Return home OT Goal Formulation: All assessment and education complete, DC therapy  OT Frequency:     Barriers to D/C:            Co-evaluation              AM-PAC OT "6 Clicks" Daily Activity     Outcome Measure Help from another person eating meals?: None Help from another person taking care of personal grooming?: A Little Help from another person toileting, which includes using toliet, bedpan, or urinal?: A Little Help from another person bathing (including washing, rinsing, drying)?: A Little Help from another person to put on and taking off regular upper body clothing?: None Help from another person to put on and taking off regular lower body clothing?: A Little 6 Click Score: 20   End of Session Equipment Utilized During Treatment: Gait belt;Rolling walker Nurse Communication: Mobility status  Activity Tolerance: Patient tolerated treatment  well Patient left: in bed;with call bell/phone within reach  OT Visit Diagnosis: Unsteadiness on feet (R26.81);Other abnormalities of gait and mobility (R26.89);Pain Pain - Right/Left: Right Pain - part of body: Knee                Time: 4562-5638 OT Time Calculation (min): 20 min Charges:  OT General Charges $OT Visit: 1 Visit OT Evaluation $OT Eval Low Complexity: 1 Low  Judith Chavez MSOT, OTR/L Acute Rehab Pager: [phone] Office: [place] 02/23/2021, 10:15 AM

## 2021-02-23 NOTE — Progress Notes (Signed)
Pt. discharged home accompanied by husband. Prescriptions and discharge instructions given with verbalization of understanding. Incision site on back with no s/s of infection - no swelling, redness, bleeding, and/or drainage noted. Opportunity given to ask questions but no question asked. Pt. transported out of this unit in wheelchair by the volunteer

## 2021-02-23 NOTE — Progress Notes (Signed)
Patient doing well.  Vitals stable.  POD 1 s/p right total knee arthroplasty.  Denies any chest pain, shortness of breath, calf pain.  Does admit to some reflux but this is better as of later this morning.  She has been able to ambulate with little guarding and no lightheadedness or dizziness.  Plan to have afternoon physical therapy session and discharge home afterward as she would like another chance to review home exercises and to optimize her mobility.  On exam dressings are intact with no gross blood or drainage.  No calf tenderness.  Not able to perform straight leg raise yet.  No significant erythema surrounding the dressing.  Ankle dorsiflexion/plantarflexion intact.  Right foot warm well perfused.

## 2021-02-23 NOTE — Care Management Important Message (Deleted)
Important Message  Patient Details  Name: Judith Chavez MRN: [id number] Date of Birth: [birth year]   Medicare Important Message Given:  Yes     Shelda Altes 02/23/2021, 11:46 AM

## 2021-02-23 NOTE — Progress Notes (Signed)
Physical Therapy Treatment Patient Details Name: Judith Chavez MRN: [id number] DOB: 16-Oct-1945 Today's Date: 02/23/2021    History of Present Illness Pt is a 75yo female presenting s/p R TKA on 8/15. PMH: anxiety, history of breast cancer, depression, DM, migraines, L foot numbness.    PT Comments    Pt slightly drowsy on entry, but really looking forward to going home after PT session. Pt reports she was just up to the bathroom and it went well. Pt educated on bed level exercises and ROM in R knee measured to be 5-65 degrees in supine. Discussed how to work exercise and mobility into her day around CPM use. D/c plans remain appropriate and husband en route to take her home.      Follow Up Recommendations  Follow surgeon's recommendation for DC plan and follow-up therapies     Equipment Recommendations  3in1 (PT);Rolling walker with 5" wheels    Recommendations for Other Services       Precautions / Restrictions Precautions Precautions: Knee Precaution Booklet Issued: No Precaution Comments: Educated on no pillow under knee; incentive spirometry. Restrictions Weight Bearing Restrictions: Yes RLE Weight Bearing: Weight bearing as tolerated       Balance     Sitting balance-Leahy Scale: Fair                                      Cognition Arousal/Alertness: Awake/alert Behavior During Therapy: WFL for tasks assessed/performed Overall Cognitive Status: Within Functional Limits for tasks assessed                                 General Comments: slightly drowsy      Exercises Total Joint Exercises Ankle Circles/Pumps: 10 reps;AROM;Both Short Arc Quad: AROM;Right;10 reps;Supine Heel Slides: AROM;10 reps;Supine;Right Hip ABduction/ADduction: AROM;Right Straight Leg Raises: AROM;Right;10 reps;Supine Goniometric ROM: 5-65    General Comments  VSS on RA, dressing in place with minor drainage noted       Pertinent Vitals/Pain Pain  Assessment: Faces Faces Pain Scale: Hurts a little bit Pain Location: right knee Pain Descriptors / Indicators: Operative site guarding;Aching;Discomfort;Grimacing;Guarding Pain Intervention(s): Limited activity within patient's tolerance;Monitored during session;Repositioned;Premedicated before session     PT Goals (current goals can now be found in the care plan section) Acute Rehab PT Goals Patient Stated Goal: to reduce pain PT Goal Formulation: With patient Time For Goal Achievement: 03/08/21 Potential to Achieve Goals: Good Progress towards PT goals: Progressing toward goals    Frequency    7X/week      PT Plan Current plan remains appropriate       AM-PAC PT "6 Clicks" Mobility   Outcome Measure  Help needed turning from your back to your side while in a flat bed without using bedrails?: A Little Help needed moving from lying on your back to sitting on the side of a flat bed without using bedrails?: A Little Help needed moving to and from a bed to a chair (including a wheelchair)?: A Little Help needed standing up from a chair using your arms (e.g., wheelchair or bedside chair)?: A Little Help needed to walk in hospital room?: A Little Help needed climbing 3-5 steps with a railing? : A Little 6 Click Score: 18    End of Session Equipment Utilized During Treatment: Gait belt Activity Tolerance: Patient tolerated treatment well;No increased pain Patient  left: with call bell/phone within reach;in chair;with nursing/sitter in room Nurse Communication: Mobility status PT Visit Diagnosis: Pain;Muscle weakness (generalized) (M62.81);Other abnormalities of gait and mobility (R26.89) Pain - Right/Left: Right Pain - part of body: Knee     Time: 1311-1345 PT Time Calculation (min) (ACUTE ONLY): 34 min  Charges:  $Therapeutic Exercise: 8-22 mins                     Marjorie Lussier B. Migdalia Dk PT, DPT Acute Rehabilitation Services Pager [phone] Office [phone]    [place] 02/23/2021, 4:02 PM

## 2021-02-23 NOTE — Progress Notes (Signed)
Inpatient Diabetes Program Recommendations  AACE/ADA: New Consensus Statement on Inpatient Glycemic Control (2015)  Target Ranges:  Prepandial:   less than 140 mg/dL      Peak postprandial:   less than 180 mg/dL (1-2 hours)      Critically ill patients:  140 - 180 mg/dL   Lab Results  Component Value Date   GLUCAP 210 (H) 02/23/2021   HGBA1C 7.4 (H) 02/15/2021    Review of Glycemic Control Results for Judith Chavez, Judith Chavez (MRN [id number]) as of 02/23/2021 11:04  Ref. Range 02/22/2021 05:55 02/22/2021 10:09 02/22/2021 16:30 02/22/2021 21:08 02/23/2021 06:25  Glucose-Capillary Latest Ref Range: 70 - 99 mg/dL 126 (H) 123 (H) 195 (H) 263 (H) 210 (H)   Diabetes history: DM 2 Outpatient Diabetes medications: Januvia 100 mg daily, Tresiba 30 units daily Current orders for Inpatient glycemic control:  Novolog moderate tid with meals and HS Tradjenta 5 mg daily  Inpatient Diabetes Program Recommendations:    Note patient was on basal insulin prior to admit.  Please consider adding Semglee 30 units daily while in the hospital.  Note patient received Decadron 10 mg x1 this morning, so anticipate that blood sugars will increase for the next 24 hours.    Thanks,  Adah Perl, RN, BC-ADM Inpatient Diabetes Coordinator Pager [phone]  (8a-5p)

## 2021-02-24 ENCOUNTER — Telehealth: Payer: Self-pay | Admitting: *Deleted

## 2021-02-24 ENCOUNTER — Other Ambulatory Visit: Payer: Self-pay | Admitting: Surgical

## 2021-02-24 DIAGNOSIS — Z7984 Long term (current) use of oral hypoglycemic drugs: Secondary | ICD-10-CM | POA: Diagnosis not present

## 2021-02-24 DIAGNOSIS — E119 Type 2 diabetes mellitus without complications: Secondary | ICD-10-CM | POA: Diagnosis not present

## 2021-02-24 DIAGNOSIS — Z87891 Personal history of nicotine dependence: Secondary | ICD-10-CM | POA: Diagnosis not present

## 2021-02-24 DIAGNOSIS — Z471 Aftercare following joint replacement surgery: Secondary | ICD-10-CM | POA: Diagnosis not present

## 2021-02-24 DIAGNOSIS — F419 Anxiety disorder, unspecified: Secondary | ICD-10-CM | POA: Diagnosis not present

## 2021-02-24 DIAGNOSIS — Z96651 Presence of right artificial knee joint: Secondary | ICD-10-CM | POA: Diagnosis not present

## 2021-02-24 DIAGNOSIS — Z853 Personal history of malignant neoplasm of breast: Secondary | ICD-10-CM | POA: Diagnosis not present

## 2021-02-24 DIAGNOSIS — F32A Depression, unspecified: Secondary | ICD-10-CM | POA: Diagnosis not present

## 2021-02-24 DIAGNOSIS — G43909 Migraine, unspecified, not intractable, without status migrainosus: Secondary | ICD-10-CM | POA: Diagnosis not present

## 2021-02-24 DIAGNOSIS — Z794 Long term (current) use of insulin: Secondary | ICD-10-CM | POA: Diagnosis not present

## 2021-02-24 MED ORDER — FAMOTIDINE 20 MG PO TABS: 20.0000 mg | ORAL_TABLET | Freq: Two times a day (BID) | ORAL | 0 refills | Status: DC

## 2021-02-24 NOTE — Telephone Encounter (Signed)
Ortho bundle call to patient. She asked about conversation you had in the hospital when you rounded for Dr. Erlinda Hong about her reflux/indigestion. She thought there was something going to be sent to her pharmacy. Was there something prescribed or over the counter you discussed?

## 2021-02-24 NOTE — Telephone Encounter (Signed)
Oh I told her I'd send something in if it did not resolve but I can sent in something for her.  I'll prescribe famotidine

## 2021-02-25 ENCOUNTER — Telehealth: Payer: Self-pay | Admitting: *Deleted

## 2021-02-25 NOTE — Telephone Encounter (Signed)
thanks

## 2021-02-25 NOTE — Telephone Encounter (Signed)
Call to patient and made aware that Famotidine was called into her pharmacy yesterday by PA.

## 2021-02-26 DIAGNOSIS — Z853 Personal history of malignant neoplasm of breast: Secondary | ICD-10-CM | POA: Diagnosis not present

## 2021-02-26 DIAGNOSIS — F32A Depression, unspecified: Secondary | ICD-10-CM | POA: Diagnosis not present

## 2021-02-26 DIAGNOSIS — Z471 Aftercare following joint replacement surgery: Secondary | ICD-10-CM | POA: Diagnosis not present

## 2021-02-26 DIAGNOSIS — G43909 Migraine, unspecified, not intractable, without status migrainosus: Secondary | ICD-10-CM | POA: Diagnosis not present

## 2021-02-26 DIAGNOSIS — F419 Anxiety disorder, unspecified: Secondary | ICD-10-CM | POA: Diagnosis not present

## 2021-02-26 DIAGNOSIS — E119 Type 2 diabetes mellitus without complications: Secondary | ICD-10-CM | POA: Diagnosis not present

## 2021-03-01 DIAGNOSIS — E119 Type 2 diabetes mellitus without complications: Secondary | ICD-10-CM | POA: Diagnosis not present

## 2021-03-01 DIAGNOSIS — F32A Depression, unspecified: Secondary | ICD-10-CM | POA: Diagnosis not present

## 2021-03-01 DIAGNOSIS — Z853 Personal history of malignant neoplasm of breast: Secondary | ICD-10-CM | POA: Diagnosis not present

## 2021-03-01 DIAGNOSIS — G43909 Migraine, unspecified, not intractable, without status migrainosus: Secondary | ICD-10-CM | POA: Diagnosis not present

## 2021-03-01 DIAGNOSIS — Z471 Aftercare following joint replacement surgery: Secondary | ICD-10-CM | POA: Diagnosis not present

## 2021-03-01 DIAGNOSIS — F419 Anxiety disorder, unspecified: Secondary | ICD-10-CM | POA: Diagnosis not present

## 2021-03-03 DIAGNOSIS — F419 Anxiety disorder, unspecified: Secondary | ICD-10-CM | POA: Diagnosis not present

## 2021-03-03 DIAGNOSIS — G43909 Migraine, unspecified, not intractable, without status migrainosus: Secondary | ICD-10-CM | POA: Diagnosis not present

## 2021-03-03 DIAGNOSIS — Z853 Personal history of malignant neoplasm of breast: Secondary | ICD-10-CM | POA: Diagnosis not present

## 2021-03-03 DIAGNOSIS — E119 Type 2 diabetes mellitus without complications: Secondary | ICD-10-CM | POA: Diagnosis not present

## 2021-03-03 DIAGNOSIS — F32A Depression, unspecified: Secondary | ICD-10-CM | POA: Diagnosis not present

## 2021-03-03 DIAGNOSIS — Z471 Aftercare following joint replacement surgery: Secondary | ICD-10-CM | POA: Diagnosis not present

## 2021-03-03 NOTE — Discharge Summary (Signed)
Physician Discharge Summary      Patient ID: Judith Chavez MRN: [id number] DOB/AGE: 1946/05/13 75 y.o.  Admit date: 02/22/2021 Discharge date: 02/23/2021  Admission Diagnoses:  Principal Problem:   Primary osteoarthritis of right knee Active Problems:   Status post total right knee replacement   Discharge Diagnoses:  Same  Surgeries: Procedure(s): RIGHT TOTAL KNEE ARTHROPLASTY on 02/22/2021   Consultants:   Discharged Condition: Stable  Hospital Course: Judith Chavez is an 75 y.o. female who was admitted 02/22/2021 with a chief complaint of right knee pain, and found to have a diagnosis of right knee osteoarthritis.  They were brought to the operating room on 02/22/2021 and underwent the above named procedures.  Pt awoke from anesthesia without complication and was transferred to the floor. On POD1, patient had pain that was well controlled and was able to ambulate well with therapy.  No concerning symptoms throughout her stay.  She was discharged home on POD 1..  Pt will f/u with Dr. Erlinda Hong in clinic in ~2 weeks.   Antibiotics given:  Anti-infectives (From admission, onward)    Start     Dose/Rate Route Frequency Ordered Stop   02/22/21 1500  ceFAZolin (ANCEF) IVPB 2g/100 mL premix        2 g 200 mL/hr over 30 Minutes Intravenous Every 6 hours 02/22/21 1229 02/22/21 2032   02/22/21 0758  vancomycin (VANCOCIN) powder  Status:  Discontinued          As needed 02/22/21 0759 02/22/21 1005   02/22/21 0600  ceFAZolin (ANCEF) IVPB 2g/100 mL premix        2 g 200 mL/hr over 30 Minutes Intravenous On call to O.R. 02/22/21 0546 02/22/21 0744   02/22/21 0551  ceFAZolin (ANCEF) 2-4 GM/100ML-% IVPB       Note to Pharmacy: Rocky Morel   : cabinet override      02/22/21 0551 02/22/21 0735     .  Recent vital signs:  Vitals:   02/23/21 0745 02/23/21 1155  BP: (!) 157/76 (!) 142/74  Pulse: 83 73  Resp: 16 16  Temp: 98 F (36.7 C) 98.3 F (36.8 C)  SpO2: 95% 94%    Recent  laboratory studies:  Results for orders placed or performed during the hospital encounter of 02/22/21  Comprehensive metabolic panel  Result Value Ref Range   Sodium 133 (L) 135 - 145 mmol/L   Potassium 4.0 3.5 - 5.1 mmol/L   Chloride 101 98 - 111 mmol/L   CO2 21 (L) 22 - 32 mmol/L   Glucose, Bld 288 (H) 70 - 99 mg/dL   BUN 11 8 - 23 mg/dL   Creatinine, Ser 0.67 0.44 - 1.00 mg/dL   Calcium 8.5 (L) 8.9 - 10.3 mg/dL   Total Protein 6.6 6.5 - 8.1 g/dL   Albumin 3.2 (L) 3.5 - 5.0 g/dL   AST 22 15 - 41 U/L   ALT 19 0 - 44 U/L   Alkaline Phosphatase 54 38 - 126 U/L   Total Bilirubin 0.3 0.3 - 1.2 mg/dL   GFR, Estimated >60 >60 mL/min   Anion gap 11 5 - 15  Protime-INR  Result Value Ref Range   Prothrombin Time 14.6 11.4 - 15.2 seconds   INR 1.1 0.8 - 1.2  APTT  Result Value Ref Range   aPTT 24 24 - 36 seconds  Urinalysis, Routine w reflex microscopic  Result Value Ref Range   Color, Urine YELLOW YELLOW   APPearance CLOUDY (A) CLEAR  Specific Gravity, Urine 1.018 1.005 - 1.030   pH 5.0 5.0 - 8.0   Glucose, UA NEGATIVE NEGATIVE mg/dL   Hgb urine dipstick NEGATIVE NEGATIVE   Bilirubin Urine NEGATIVE NEGATIVE   Ketones, ur NEGATIVE NEGATIVE mg/dL   Protein, ur NEGATIVE NEGATIVE mg/dL   Nitrite NEGATIVE NEGATIVE   Leukocytes,Ua MODERATE (A) NEGATIVE   RBC / HPF 0-5 0 - 5 RBC/hpf   WBC, UA 6-10 0 - 5 WBC/hpf   Bacteria, UA RARE (A) NONE SEEN   Squamous Epithelial / LPF 11-20 0 - 5   Mucus PRESENT   Glucose, capillary  Result Value Ref Range   Glucose-Capillary 126 (H) 70 - 99 mg/dL  Glucose, capillary  Result Value Ref Range   Glucose-Capillary 123 (H) 70 - 99 mg/dL   Comment 1 Notify RN    Comment 2 Document in Chart   Glucose, capillary  Result Value Ref Range   Glucose-Capillary 195 (H) 70 - 99 mg/dL  CBC  Result Value Ref Range   WBC 11.0 (H) 4.0 - 10.5 K/uL   RBC 4.45 3.87 - 5.11 MIL/uL   Hemoglobin 12.3 12.0 - 15.0 g/dL   HCT 38.3 36.0 - 46.0 %   MCV 86.1 80.0  - 100.0 fL   MCH 27.6 26.0 - 34.0 pg   MCHC 32.1 30.0 - 36.0 g/dL   RDW 13.3 11.5 - 15.5 %   Platelets 213 150 - 400 K/uL   nRBC 0.0 0.0 - 0.2 %  Basic metabolic panel  Result Value Ref Range   Sodium 133 (L) 135 - 145 mmol/L   Potassium 4.2 3.5 - 5.1 mmol/L   Chloride 102 98 - 111 mmol/L   CO2 22 22 - 32 mmol/L   Glucose, Bld 340 (H) 70 - 99 mg/dL   BUN 11 8 - 23 mg/dL   Creatinine, Ser 0.72 0.44 - 1.00 mg/dL   Calcium 8.9 8.9 - 10.3 mg/dL   GFR, Estimated >60 >60 mL/min   Anion gap 9 5 - 15  Glucose, capillary  Result Value Ref Range   Glucose-Capillary 263 (H) 70 - 99 mg/dL   Comment 1 Notify RN    Comment 2 Document in Chart   Glucose, capillary  Result Value Ref Range   Glucose-Capillary 210 (H) 70 - 99 mg/dL   Comment 1 Notify RN    Comment 2 Document in Chart   Glucose, capillary  Result Value Ref Range   Glucose-Capillary 266 (H) 70 - 99 mg/dL   Comment 1 Notify RN    Comment 2 Document in Chart     Discharge Medications:   Allergies as of 02/23/2021   No Known Allergies      Medication List     TAKE these medications    acyclovir 800 MG tablet Commonly known as: ZOVIRAX Take 800 mg by mouth 2 (two) times daily as needed (fever blisters). Take for 5 days   ALPRAZolam 0.5 MG tablet Commonly known as: XANAX Take 0.5 mg by mouth 2 (two) times daily as needed for anxiety.   aspirin EC 81 MG tablet Take 1 tablet (81 mg total) by mouth 2 (two) times daily. To be taken after surgery as needed   Dialyvite Vitamin D 5000 125 MCG (5000 UT) capsule Generic drug: Cholecalciferol Take 5,000 Units by mouth daily.   docusate sodium 100 MG capsule Commonly known as: Colace Take 1 capsule (100 mg total) by mouth daily as needed. What changed: reasons to take this  ibuprofen 200 MG tablet Commonly known as: ADVIL Take 200 mg by mouth every 6 (six) hours as needed for headache or cramping.   methocarbamol 500 MG tablet Commonly known as: Robaxin Take 1  tablet (500 mg total) by mouth 2 (two) times daily as needed. What changed: reasons to take this   NovoFine 32G X 6 MM Misc Generic drug: Insulin Pen Needle AS DIRECTED ONCE A DAY WITH TRESIBA FLEXTOUCH   ondansetron 4 MG tablet Commonly known as: Zofran Take 1 tablet (4 mg total) by mouth every 8 (eight) hours as needed for nausea or vomiting.   OneTouch Delica Lancets 99991111 Misc 2 (two) times daily. for testing   OneTouch Verio test strip Generic drug: glucose blood USE AS DIRECTED TO TEST BLOOD SUGAR TWICE A DAY   oxyCODONE-acetaminophen 5-325 MG tablet Commonly known as: Percocet Take 1-2 tablets by mouth every 6 (six) hours as needed. To be taken after surgery as needed What changed: reasons to take this   polyethylene glycol 17 g packet Commonly known as: MIRALAX / GLYCOLAX Take 17 g by mouth daily as needed for moderate constipation.   sitaGLIPtin 100 MG tablet Commonly known as: JANUVIA Take 100 mg by mouth daily.   tamoxifen 20 MG tablet Commonly known as: NOLVADEX Take 1 tablet (20 mg total) by mouth daily.   Tyler Aas FlexTouch 100 UNIT/ML FlexTouch Pen Generic drug: insulin degludec Inject 30 Units into the skin daily.   venlafaxine XR 75 MG 24 hr capsule Commonly known as: EFFEXOR-XR TAKE 1 CAPSULE (75 MG TOTAL) BY MOUTH DAILY WITH BREAKFAST. TAKE WITH 37.5 MG TAB   venlafaxine XR 37.5 MG 24 hr capsule Commonly known as: EFFEXOR-XR Take 1 capsule (37.5 mg total) by mouth daily with breakfast. Take with 75 mg tab        Diagnostic Studies: DG Knee Right Port  Result Date: 02/22/2021 CLINICAL DATA:  Right knee replacement. EXAM: PORTABLE RIGHT KNEE - 1-2 VIEW COMPARISON:  Right knee x-rays dated Nov 25, 2020. FINDINGS: The right knee demonstrates a total knee arthroplasty without evidence of hardware failure or complication. There is expected intra-articular air. There is no fracture or dislocation. The alignment is anatomic. Post-surgical changes noted in  the surrounding soft tissues. IMPRESSION: 1. Interval right total knee arthroplasty without acute postoperative complication. Electronically Signed   By: Titus Dubin M.D.   On: 02/22/2021 10:48   MM 3D SCREEN BREAST UNI LEFT  Result Date: 02/13/2021 CLINICAL DATA:  Screening. EXAM: DIGITAL SCREENING UNILATERAL LEFT MAMMOGRAM WITH CAD AND TOMOSYNTHESIS TECHNIQUE: Left screening digital craniocaudal and mediolateral oblique mammograms were obtained. Left screening digital breast tomosynthesis was performed. The images were evaluated with computer-aided detection. COMPARISON:  Previous exam(s). ACR Breast Density Category b: There are scattered areas of fibroglandular density. FINDINGS: The patient has had a right mastectomy. There are no findings suspicious for malignancy. IMPRESSION: No mammographic evidence of malignancy. A result letter of this screening mammogram will be mailed directly to the patient. RECOMMENDATION: Screening mammogram in one year.  (Code:SM-L-32M) BI-RADS CATEGORY  1: Negative. Electronically Signed   By: Ammie Ferrier M.D.   On: 02/13/2021 09:30    Disposition: Discharge disposition: 01-Home or Self Care       Discharge Instructions     Call MD / Call 911   Complete by: As directed    If you experience chest pain or shortness of breath, CALL 911 and be transported to the hospital emergency room.  If you develope a fever  above 101 F, pus (white drainage) or increased drainage or redness at the wound, or calf pain, call your surgeon's office.   Constipation Prevention   Complete by: As directed    Drink plenty of fluids.  Prune juice may be helpful.  You may use a stool softener, such as Colace (over the counter) 100 mg twice a day.  Use MiraLax (over the counter) for constipation as needed.   Diet - low sodium heart healthy   Complete by: As directed    Discharge instructions   Complete by: As directed    You may shower, dressing is waterproof.  Do not remove the  dressing, we will remove it at your first post-op appointment.  Do not take a bath or soak the knee in a tub or pool.  You may weightbear as you can tolerate on the operative leg with a walker.  Continue home exercises as directed by PT.  Do NOT put a pillow under your knee.  You will follow-up with Dr. Erlinda Hong in the clinic in 2 weeks at your given appointment date.    INSTRUCTIONS AFTER JOINT REPLACEMENT   Remove items at home which could result in a fall. This includes throw rugs or furniture in walking pathways ICE to the affected joint every three hours while awake for 30 minutes at a time, for at least the first 3-5 days, and then as needed for pain and swelling.  Continue to use ice for pain and swelling. You may notice swelling that will progress down to the foot and ankle.  This is normal after surgery.  Elevate your leg when you are not up walking on it.   Continue to use the breathing machine you got in the hospital (incentive spirometer) which will help keep your temperature down.  It is common for your temperature to cycle up and down following surgery, especially at night when you are not up moving around and exerting yourself.  The breathing machine keeps your lungs expanded and your temperature down.   DIET:  As you were doing prior to hospitalization, we recommend a well-balanced diet.  DRESSING / WOUND CARE / SHOWERING  Keep the surgical dressing until follow up.  The dressing is water proof, so you can shower without any extra covering.  IF THE DRESSING FALLS OFF or the wound gets wet inside, change the dressing with sterile gauze.  Please use good hand washing techniques before changing the dressing.  Do not use any lotions or creams on the incision until instructed by your surgeon.    ACTIVITY  Increase activity slowly as tolerated, but follow the weight bearing instructions below.   No driving for 6 weeks or until further direction given by your physician.  You cannot drive while  taking narcotics.  No lifting or carrying greater than 10 lbs. until further directed by your surgeon. Avoid periods of inactivity such as sitting longer than an hour when not asleep. This helps prevent blood clots.  You may return to work once you are authorized by your doctor.     WEIGHT BEARING   Weight bearing as tolerated with assist device (walker, cane, etc) as directed, use it as long as suggested by your surgeon or therapist, typically at least 4-6 weeks.   EXERCISES  Results after joint replacement surgery are often greatly improved when you follow the exercise, range of motion and muscle strengthening exercises prescribed by your doctor. Safety measures are also important to protect the joint from further  injury. Any time any of these exercises cause you to have increased pain or swelling, decrease what you are doing until you are comfortable again and then slowly increase them. If you have problems or questions, call your caregiver or physical therapist for advice.   Rehabilitation is important following a joint replacement. After just a few days of immobilization, the muscles of the leg can become weakened and shrink (atrophy).  These exercises are designed to build up the tone and strength of the thigh and leg muscles and to improve motion. Often times heat used for twenty to thirty minutes before working out will loosen up your tissues and help with improving the range of motion but do not use heat for the first two weeks following surgery (sometimes heat can increase post-operative swelling).   These exercises can be done on a training (exercise) mat, on the floor, on a table or on a bed. Use whatever works the best and is most comfortable for you.    Use music or television while you are exercising so that the exercises are a pleasant break in your day. This will make your life better with the exercises acting as a break in your routine that you can look forward to.   Perform all  exercises about fifteen times, three times per day or as directed.  You should exercise both the operative leg and the other leg as well.  Exercises include:   Quad Sets - Tighten up the muscle on the front of the thigh (Quad) and hold for 5-10 seconds.   Straight Leg Raises - With your knee straight (if you were given a brace, keep it on), lift the leg to 60 degrees, hold for 3 seconds, and slowly lower the leg.  Perform this exercise against resistance later as your leg gets stronger.  Leg Slides: Lying on your back, slowly slide your foot toward your buttocks, bending your knee up off the floor (only go as far as is comfortable). Then slowly slide your foot back down until your leg is flat on the floor again.  Angel Wings: Lying on your back spread your legs to the side as far apart as you can without causing discomfort.  Hamstring Strength:  Lying on your back, push your heel against the floor with your leg straight by tightening up the muscles of your buttocks.  Repeat, but this time bend your knee to a comfortable angle, and push your heel against the floor.  You may put a pillow under the heel to make it more comfortable if necessary.   A rehabilitation program following joint replacement surgery can speed recovery and prevent re-injury in the future due to weakened muscles. Contact your doctor or a physical therapist for more information on knee rehabilitation.    CONSTIPATION  Constipation is defined medically as fewer than three stools per week and severe constipation as less than one stool per week.  Even if you have a regular bowel pattern at home, your normal regimen is likely to be disrupted due to multiple reasons following surgery.  Combination of anesthesia, postoperative narcotics, change in appetite and fluid intake all can affect your bowels.   YOU MUST use at least one of the following options; they are listed in order of increasing strength to get the job done.  They are all  available over the counter, and you may need to use some, POSSIBLY even all of these options:    Drink plenty of fluids (prune juice may  be helpful) and high fiber foods Colace 100 mg by mouth twice a day  Senokot for constipation as directed and as needed Dulcolax (bisacodyl), take with full glass of water  Miralax (polyethylene glycol) once or twice a day as needed.  If you have tried all these things and are unable to have a bowel movement in the first 3-4 days after surgery call either your surgeon or your primary doctor.    If you experience loose stools or diarrhea, hold the medications until you stool forms back up.  If your symptoms do not get better within 1 week or if they get worse, check with your doctor.  If you experience "the worst abdominal pain ever" or develop nausea or vomiting, please contact the office immediately for further recommendations for treatment.   ITCHING:  If you experience itching with your medications, try taking only a single pain pill, or even half a pain pill at a time.  You can also use Benadryl over the counter for itching or also to help with sleep.   TED HOSE STOCKINGS:  Use stockings on both legs until for at least 2 weeks or as directed by physician office. They may be removed at night for sleeping.  MEDICATIONS:  See your medication summary on the "After Visit Summary" that nursing will review with you.  You may have some home medications which will be placed on hold until you complete the course of blood thinner medication.  It is important for you to complete the blood thinner medication as prescribed.  PRECAUTIONS:  If you experience chest pain or shortness of breath - call 911 immediately for transfer to the hospital emergency department.   If you develop a fever greater that 101 F, purulent drainage from wound, increased redness or drainage from wound, foul odor from the wound/dressing, or calf pain - CONTACT YOUR SURGEON.                                                    FOLLOW-UP APPOINTMENTS:  If you do not already have a post-op appointment, please call the office for an appointment to be seen by your surgeon.  Guidelines for how soon to be seen are listed in your "After Visit Summary", but are typically between 1-4 weeks after surgery.  OTHER INSTRUCTIONS:   Knee Replacement:  Do not place pillow under knee, focus on keeping the knee straight while resting. CPM instructions: 0-90 degrees, 2 hours in the morning, 2 hours in the afternoon, and 2 hours in the evening. Place foam block, curve side up under heel at all times except when in CPM or when walking.  DO NOT modify, tear, cut, or change the foam block in any way.  POST-OPERATIVE OPIOID TAPER INSTRUCTIONS: It is important to wean off of your opioid medication as soon as possible. If you do not need pain medication after your surgery it is ok to stop day one. Opioids include: Codeine, Hydrocodone(Norco, Vicodin), Oxycodone(Percocet, oxycontin) and hydromorphone amongst others.  Long term and even short term use of opiods can cause: Increased pain response Dependence Constipation Depression Respiratory depression And more.  Withdrawal symptoms can include Flu like symptoms Nausea, vomiting And more Techniques to manage these symptoms Hydrate well Eat regular healthy meals Stay active Use relaxation techniques(deep breathing, meditating, yoga) Do Not substitute Alcohol to help  with tapering If you have been on opioids for less than two weeks and do not have pain than it is ok to stop all together.  Plan to wean off of opioids This plan should start within one week post op of your joint replacement. Maintain the same interval or time between taking each dose and first decrease the dose.  Cut the total daily intake of opioids by one tablet each day Next start to increase the time between doses. The last dose that should be eliminated is the evening dose.   MAKE SURE  YOU:  Understand these instructions.  Get help right away if you are not doing well or get worse.    Thank you for letting us be a part of your medical care team.  It is a privilege we respect greatly.  We hope these instructions will help you stay on track for a fast and full recovery!    Dental Antibiotics:  In most cases prophylactic antibiotics for Dental procdeures after total joint surgery are not necessary.  Exceptions are as follows:  1. History of prior total joint infection  2. Severely immunocompromised (Organ Transplant, cancer chemotherapy, Rheumatoid biologic meds such as New Home)  3. Poorly controlled diabetes (A1C &gt; 8.0, blood glucose over 200)  If you have one of these conditions, contact your surgeon for an antibiotic prescription, prior to your dental procedure.   Increase activity slowly as tolerated   Complete by: As directed    Post-operative opioid taper instructions:   Complete by: As directed    POST-OPERATIVE OPIOID TAPER INSTRUCTIONS: It is important to wean off of your opioid medication as soon as possible. If you do not need pain medication after your surgery it is ok to stop day one. Opioids include: Codeine, Hydrocodone(Norco, Vicodin), Oxycodone(Percocet, oxycontin) and hydromorphone amongst others.  Long term and even short term use of opiods can cause: Increased pain response Dependence Constipation Depression Respiratory depression And more.  Withdrawal symptoms can include Flu like symptoms Nausea, vomiting And more Techniques to manage these symptoms Hydrate well Eat regular healthy meals Stay active Use relaxation techniques(deep breathing, meditating, yoga) Do Not substitute Alcohol to help with tapering If you have been on opioids for less than two weeks and do not have pain than it is ok to stop all together.  Plan to wean off of opioids This plan should start within one week post op of your joint replacement. Maintain the  same interval or time between taking each dose and first decrease the dose.  Cut the total daily intake of opioids by one tablet each day Next start to increase the time between doses. The last dose that should be eliminated is the evening dose.           Follow-up Information     Leandrew Koyanagi, MD. Go on 03/09/2021.   Specialty: Orthopedic Surgery Why: At 10:00 am For wound re-check, For suture removal Contact information: [place] [phone]         [facility]. Go on 03/09/2021.   Specialty: Rehabilitation Why: at 11:00 am for your first Outpatient Physical Therapy appointment in our office. Please complete your appointment with Dr. Erlinda Hong and proceed upstairs to the therapy check in desk. Contact information: [address] [place] [phone]                 Signed: Donella Stade 03/03/2021, 11:41 PM

## 2021-03-05 ENCOUNTER — Other Ambulatory Visit: Payer: Self-pay

## 2021-03-05 ENCOUNTER — Inpatient Hospital Stay (HOSPITAL_BASED_OUTPATIENT_CLINIC_OR_DEPARTMENT_OTHER): Payer: Medicare Other | Admitting: Hematology

## 2021-03-05 ENCOUNTER — Other Ambulatory Visit: Payer: Self-pay | Admitting: Physician Assistant

## 2021-03-05 ENCOUNTER — Telehealth: Payer: Self-pay | Admitting: Orthopaedic Surgery

## 2021-03-05 ENCOUNTER — Telehealth: Payer: Self-pay | Admitting: *Deleted

## 2021-03-05 DIAGNOSIS — Z471 Aftercare following joint replacement surgery: Secondary | ICD-10-CM | POA: Diagnosis not present

## 2021-03-05 DIAGNOSIS — F32A Depression, unspecified: Secondary | ICD-10-CM | POA: Diagnosis not present

## 2021-03-05 DIAGNOSIS — Z170 Estrogen receptor positive status [ER+]: Secondary | ICD-10-CM

## 2021-03-05 DIAGNOSIS — C50411 Malignant neoplasm of upper-outer quadrant of right female breast: Secondary | ICD-10-CM

## 2021-03-05 DIAGNOSIS — F3341 Major depressive disorder, recurrent, in partial remission: Secondary | ICD-10-CM | POA: Diagnosis not present

## 2021-03-05 DIAGNOSIS — G43909 Migraine, unspecified, not intractable, without status migrainosus: Secondary | ICD-10-CM | POA: Diagnosis not present

## 2021-03-05 DIAGNOSIS — E1169 Type 2 diabetes mellitus with other specified complication: Secondary | ICD-10-CM | POA: Diagnosis not present

## 2021-03-05 DIAGNOSIS — Z853 Personal history of malignant neoplasm of breast: Secondary | ICD-10-CM | POA: Diagnosis not present

## 2021-03-05 DIAGNOSIS — E119 Type 2 diabetes mellitus without complications: Secondary | ICD-10-CM | POA: Diagnosis not present

## 2021-03-05 DIAGNOSIS — C50919 Malignant neoplasm of unspecified site of unspecified female breast: Secondary | ICD-10-CM | POA: Diagnosis not present

## 2021-03-05 DIAGNOSIS — F419 Anxiety disorder, unspecified: Secondary | ICD-10-CM | POA: Diagnosis not present

## 2021-03-05 MED ORDER — OXYCODONE-ACETAMINOPHEN 5-325 MG PO TABS: 1.0000 | ORAL_TABLET | Freq: Three times a day (TID) | ORAL | 0 refills | Status: DC | PRN

## 2021-03-05 NOTE — Telephone Encounter (Signed)
Ortho bundle 7 day call completed. 

## 2021-03-05 NOTE — Telephone Encounter (Signed)
Sent in

## 2021-03-05 NOTE — Telephone Encounter (Signed)
Pt needs refill on oxycodone.  °

## 2021-03-08 DIAGNOSIS — G43909 Migraine, unspecified, not intractable, without status migrainosus: Secondary | ICD-10-CM | POA: Diagnosis not present

## 2021-03-08 DIAGNOSIS — F32A Depression, unspecified: Secondary | ICD-10-CM | POA: Diagnosis not present

## 2021-03-08 DIAGNOSIS — E119 Type 2 diabetes mellitus without complications: Secondary | ICD-10-CM | POA: Diagnosis not present

## 2021-03-08 DIAGNOSIS — Z471 Aftercare following joint replacement surgery: Secondary | ICD-10-CM | POA: Diagnosis not present

## 2021-03-08 DIAGNOSIS — Z853 Personal history of malignant neoplasm of breast: Secondary | ICD-10-CM | POA: Diagnosis not present

## 2021-03-08 DIAGNOSIS — F419 Anxiety disorder, unspecified: Secondary | ICD-10-CM | POA: Diagnosis not present

## 2021-03-09 ENCOUNTER — Other Ambulatory Visit: Payer: Self-pay

## 2021-03-09 ENCOUNTER — Telehealth: Payer: Self-pay | Admitting: *Deleted

## 2021-03-09 ENCOUNTER — Ambulatory Visit (INDEPENDENT_AMBULATORY_CARE_PROVIDER_SITE_OTHER): Payer: Medicare Other | Admitting: Physical Therapy

## 2021-03-09 ENCOUNTER — Encounter: Payer: Self-pay | Admitting: Physical Therapy

## 2021-03-09 ENCOUNTER — Ambulatory Visit (INDEPENDENT_AMBULATORY_CARE_PROVIDER_SITE_OTHER): Payer: Medicare Other | Admitting: Physician Assistant

## 2021-03-09 ENCOUNTER — Telehealth: Payer: Self-pay | Admitting: Hematology

## 2021-03-09 ENCOUNTER — Encounter: Payer: Self-pay | Admitting: Orthopaedic Surgery

## 2021-03-09 DIAGNOSIS — Z96651 Presence of right artificial knee joint: Secondary | ICD-10-CM

## 2021-03-09 DIAGNOSIS — M25661 Stiffness of right knee, not elsewhere classified: Secondary | ICD-10-CM | POA: Diagnosis not present

## 2021-03-09 DIAGNOSIS — M25561 Pain in right knee: Secondary | ICD-10-CM

## 2021-03-09 DIAGNOSIS — R600 Localized edema: Secondary | ICD-10-CM | POA: Diagnosis not present

## 2021-03-09 DIAGNOSIS — R262 Difficulty in walking, not elsewhere classified: Secondary | ICD-10-CM

## 2021-03-09 MED ORDER — HYDROCODONE-ACETAMINOPHEN 5-325 MG PO TABS: 1.0000 | ORAL_TABLET | Freq: Three times a day (TID) | ORAL | 0 refills | Status: DC | PRN

## 2021-03-09 NOTE — Patient Instructions (Signed)
Access Code: [email or web address] URL: [email or web address] Date: 03/09/2021 Prepared by: Kearney Hard  Exercises Long Sitting Quad Set with Towel Roll Under Heel - 3 x daily - 7 x weekly - 2 sets - 10 reps - 5 seconds hold Supine Active Straight Leg Raise - 3 x daily - 7 x weekly - 2 sets - 10 reps Sit to Stand with Counter Support - 3 x daily - 7 x weekly - 2 sets - 10 reps Seated Heel Slide - 3 x daily - 7 x weekly - 2 sets - 10 reps - 5 seconds hold Seated Long Arc Quad - 3 x daily - 7 x weekly - 2 sets - 10 reps - 5 seconds hold Supine Heel Slide with Strap - 1 x daily - 7 x weekly - 2 sets - 10 reps - 5 seconds hold

## 2021-03-09 NOTE — Therapy (Signed)
[facility] [place], [place], [address] Phone: [phone]   Fax:  [phone]  Physical Therapy Evaluation  Patient Details  Name: Judith Chavez MRN: [id number] Date of Birth: 02/04/1946 Referring Provider (PT): Frankey Shown MD   Encounter Date: 03/09/2021   PT End of Session - 03/09/21 1118     Visit Number 1    Number of Visits 24    Date for PT Re-Evaluation 06/04/21    Progress Note Due on Visit 10    PT Start Time 1114    PT Stop Time 1145    PT Time Calculation (min) 31 min    Activity Tolerance Patient tolerated treatment well;No increased pain    Behavior During Therapy [facility] for tasks assessed/performed             Past Medical History:  Diagnosis Date   Anemia    as a teenager   Anxiety    Arthritis    knees   Cancer ([place]) 03/2020   right breast DCIS, LCIS   Depression    Diabetes mellitus without complication ([place])    Type 2   Family history of breast cancer 02/28/2020   Headache    migraines   History of pneumonia    Numbness    "left foot"    Pneumonia    Poor circulation     Past Surgical History:  Procedure Laterality Date   ABCESS DRAINAGE     "after cesarean- in hospital for 3 months"   ABDOMINAL HYSTERECTOMY     BACK SURGERY     CESAREAN SECTION  07/11/1968   CHOLECYSTECTOMY     COLONOSCOPY     DILATION AND CURETTAGE OF UTERUS     EYE SURGERY Right    MENISCUS REPAIR Bilateral    SIMPLE MASTECTOMY WITH AXILLARY SENTINEL NODE BIOPSY Right 04/13/2020   Procedure: RIGHT MASTECTOMY WITH RIGHT AXILLARY SENTINEL NODE BIOPSY;  Surgeon: Coralie Keens, MD;  Location: [place];  Service: General;  Laterality: Right;   Rodriguez Hevia Right 02/22/2021   Procedure: RIGHT TOTAL KNEE ARTHROPLASTY;  Surgeon: Leandrew Koyanagi, MD;  Location: [place];  Service: Orthopedics;  Laterality: Right;    There were no vitals filed for this visit.    Subjective  Assessment - 03/09/21 1110     Subjective Pt arriving to threpay 7 minutes late due to MD appointment and then needed a bathroom stop prior to her evaluation. Pt s/p right TKA on 02/22/2021. Pt amb with rolling walker.    Patient is accompained by: Family member   husband   Pertinent History Rt breast cancer DCIS, LCIS, ER/PR positive s/p mastectomy with 0/3 nodes removed on 04/13/20 Dr. Ninfa Linden and radiation status unknown.  . Other history includes anxiety, OA, hysterectomy, DM    Diagnostic tests X-ray    Patient Stated Goals Walk without walker, function without pain    Currently in Pain? Yes    Pain Score 5     Pain Location Knee    Pain Orientation Right    Pain Descriptors / Indicators Aching;Tightness;Sore    Pain Type Surgical pain    Pain Onset 1 to 4 weeks ago    Pain Frequency Constant    Aggravating Factors  bending, walking, prolonged sitting    Pain Relieving Factors changing positions, ice, pain meds    Effect of Pain on Daily Activities difficulty with ADL's and walking  [facility] PT Assessment - 03/09/21 0001       Assessment   Medical Diagnosis M17.11 primary OA of right knee, R TKA    Referring Provider (PT) Frankey Shown MD    Onset Date/Surgical Date 02/22/21    Hand Dominance Right    Prior Therapy HHPT      Precautions   Precautions None      Restrictions   Weight Bearing Restrictions No      Balance Screen   Has the patient fallen in the past 6 months No    Is the patient reluctant to leave their home because of a fear of falling?  No      Prior Function   Level of Independence Independent    Vocation Retired      Charity fundraiser Status Within Functional Limits for tasks assessed      Observation/Other Assessments   Focus on Therapeutic Outcomes (FOTO)  60% (predicted 71%)      Observation/Other Assessments-Edema    Edema --   Rt  knee: 44 cenimters, Lt knee 39 centimeters     ROM / Strength   AROM / PROM /  Strength AROM;PROM;Strength      AROM   Overall AROM  Deficits    AROM Assessment Site Knee    Right/Left Knee Right;Left    Right Knee Extension -8    Right Knee Flexion 86    Left Knee Extension 0    Left Knee Flexion 125      PROM   PROM Assessment Site Knee    Right/Left Knee Right    Right Knee Extension -6    Right Knee Flexion 90      Strength   Strength Assessment Site Knee    Right/Left Knee Right;Left    Right Knee Flexion 3+/5    Right Knee Extension 3+/5    Left Knee Flexion 5/5    Left Knee Extension 5/5      Palpation   Palpation comment TTP; medial and lateral joint line, Pt reporting more medial pain with radiation into right anterior ankle      Transfers   Five time sit to stand comments  14 seconds with UE support on arm rests      Ambulation/Gait   Gait Comments amb with rolling walker with antalgic gait                        Objective measurements completed on examination: See above findings.       [facility] Adult PT Treatment/Exercise - 03/09/21 0001       Exercises   Exercises Knee/Hip      Knee/Hip Exercises: Seated   Long Arc Quad Right;5 reps    Heel Slides AROM;Right;5 reps    Sit to Sand 5 reps;with UE support      Knee/Hip Exercises: Supine   Quad Sets Strengthening;AROM;Right;10 reps    Quad Sets Limitations holding 5 seconds    Heel Slides AROM;Right    Straight Leg Raises Strengthening;Right;10 reps                    PT Education - 03/09/21 1117     Education Details PT POC, HEP    Person(s) Educated Patient;Spouse    Methods Explanation;Demonstration;Handout;Verbal cues;Tactile cues    Comprehension Verbalized understanding;Returned demonstration              PT Short Term Goals - 03/09/21 1150  PT SHORT TERM GOAL #1   Title Pt will be independent in her initial HEP.    Time 3    Period Weeks    Status New    Target Date 04/02/21      PT SHORT TERM GOAL #2   Title Pt will be  able to perform sit to stand with no UE support from standard chair independently.    Baseline using bilateral UE for push off.    Time 3    Period Weeks    Status New    Target Date 04/02/21               PT Long Term Goals - 03/09/21 1247       PT LONG TERM GOAL #1   Title Pt will be independent in her advanced HEP.    Time 12    Period Weeks    Status New    Target Date 06/04/21      PT LONG TERM GOAL #2   Title Pt will be able to perform 5 time sit to stand in </= 12 seconds with no UE support.    Time 12    Period Weeks    Status New    Target Date 06/04/21      PT LONG TERM GOAL #3   Title Pt will improve FOTO score to >/= 71%    Baseline 60% on 03/09/2021    Time 12    Period Weeks    Status New    Target Date 06/04/21      PT LONG TERM GOAL #4   Title Pt will perform right knee active ROM  0 to 120 degrees to improve functional mobility and gait.    Time 12    Period Weeks    Status New    Target Date 06/04/21      PT LONG TERM GOAL #5   Title Pt will be able to amb community surfaces without device with normalize gait pattern.    Time 12    Period Weeks    Status New    Target Date 06/04/21      Additional Long Term Goals   Additional Long Term Goals Yes      PT LONG TERM GOAL #6   Title Pt will be able to navigate 1 flight of stairs with single hand rail with step over step.    Time 12    Period Weeks    Status New                    Plan - 03/09/21 1251     Clinical Impression Statement Pt arriving today for PT evaluation s/p right TKA on 02/22/2021. Pt amb today with rolling walker with antalgic step through gait pattern. Pt reporting 5/10 pain today after having her knee in dependent position during MD visit and then during therpay visit. Pt reporting working with HHPT and has been doing the exercises provided. Pt was issued a new HEP and was able to return demonstration. Pt's husband present during evaluation. Pt wearing thigh  high compression stockings to help with edema. Steri strips intact. Pt with limitations in right knee active and passive ROM, strength and functional mobililty.  Skilled PT needed to adddress pt's impairments with the below interventions.    Personal Factors and Comorbidities Comorbidity 3+    Comorbidities anemia, arthritis, anxiety, CA, DM, HA, pneumonia, abdominal hysterectomy back surgery , cesarean section, meniscus repair bilateral, rt  mastectomy 2021    Examination-Activity Limitations Reach Overhead;Sleep    Examination-Participation Restrictions Cleaning;Meal Prep;Yard Work;Community Activity;Driving;Shop;Laundry    Stability/Clinical Decision Making Stable/Uncomplicated    Clinical Decision Making Low    Rehab Potential Good    PT Frequency 2x / week    PT Duration 12 weeks    PT Treatment/Interventions ADLs/Self Care Home Management;Cryotherapy;Gait training;Stair training;Functional mobility training;Therapeutic activities;Therapeutic exercise;Balance training;Neuromuscular re-education;Patient/family education;Passive range of motion;Scar mobilization;Manual techniques;Dry needling;Taping;Vasopneumatic Device;Moist Heat    PT Next Visit Plan begin bike, flexion focused knee ROM, strengthening, gait training with st cane if pt is able to perform 15 SLR without lag    PT Home Exercise Plan Access Code: [email or web address]    Consulted and Agree with Plan of Care Patient             Patient will benefit from skilled therapeutic intervention in order to improve the following deficits and impairments:  Pain, Postural dysfunction, Decreased range of motion, Decreased activity tolerance, Difficulty walking, Decreased mobility, Decreased strength, Increased edema, Decreased balance  Visit Diagnosis: Acute pain of right knee  Stiffness of right knee, not elsewhere classified  Difficulty in walking, not elsewhere classified  Localized edema     Problem List Patient Active Problem List    Diagnosis Date Noted   Status post total right knee replacement 02/22/2021   History of right breast cancer 04/13/2020   Genetic testing 03/10/2020   Family history of breast cancer 02/28/2020   Malignant neoplasm of upper-outer quadrant of right breast in female, estrogen receptor positive ([place]) 02/26/2020   Primary osteoarthritis of right knee 03/20/2019   Primary osteoarthritis of left knee 03/20/2019   Primary osteoarthritis of both knees 04/24/2017   Spondylolisthesis at L3-L4 level 06/01/2015    Oretha Caprice, PT MPT 03/09/2021, 12:58 PM  [facility] [address] [place], [place], [address] Phone: [phone]   Fax:  [phone]  Name: Marquiesha Asare MRN: [id number] Date of Birth: 1946/05/01

## 2021-03-09 NOTE — Telephone Encounter (Signed)
Ortho bundle 14 day in office meeting completed. Overall doing well. OPPT starts today.

## 2021-03-09 NOTE — Progress Notes (Signed)
Post-Op Visit Note   Patient: Judith Chavez           Date of Birth: 1945/10/03           MRN: [id number] Visit Date: 03/09/2021 PCP: Mayra Neer, MD   Assessment & Plan:  Chief Complaint:  Chief Complaint  Patient presents with   Right Knee - Pain   Visit Diagnoses:  1. Hx of total knee replacement, right     Plan: Patient is a pleasant 75 year old female who comes in today 2 weeks status post right total knee replacement, Date of surgery 02/22/2021.  She has been doing okay.  She has not been able to sleep while on oxycodone as it keeps her awake.  He has been getting home health physical therapy and has made great progress.  She is ambulating with a walker.  Examination of her right knee reveals well-healing surgical incision with nylon sutures in place.  No evidence of infection or cellulitis.  Calf soft nontender.  Today, sutures were removed and Steri-Strips applied.  She will continue wearing her compression socks.  Continue with taking a baby aspirin twice a day until 6 weeks postop.  She already has outpatient physical therapy scheduled for which she will start today.  Have called in [place] for which she is taken before with relief of symptoms and without causing insomnia.  Follow-up with [place] in 4 weeks time for repeat evaluation and 2 view x-rays of the right knee.  Dental prophylaxis reinforced.  Call with concerns or questions. Follow-Up Instructions: Return in about 4 weeks (around 04/06/2021).   Orders:  No orders of the defined types were placed in this encounter.  Meds ordered this encounter  Medications   HYDROcodone-acetaminophen (NORCO) 5-325 MG tablet    Sig: Take 1-2 tablets by mouth 3 (three) times daily as needed.    Dispense:  40 tablet    Refill:  0     Imaging: No new imaging  PMFS History: Patient Active Problem List   Diagnosis Date Noted   Status post total right knee replacement 02/22/2021   History of right breast cancer 04/13/2020   Genetic  testing 03/10/2020   Family history of breast cancer 02/28/2020   Malignant neoplasm of upper-outer quadrant of right breast in female, estrogen receptor positive ([place]) 02/26/2020   Primary osteoarthritis of right knee 03/20/2019   Primary osteoarthritis of left knee 03/20/2019   Primary osteoarthritis of both knees 04/24/2017   Spondylolisthesis at L3-L4 level 06/01/2015   Past Medical History:  Diagnosis Date   Anemia    as a teenager   Anxiety    Arthritis    knees   Cancer ([place]) 03/2020   right breast DCIS, LCIS   Depression    Diabetes mellitus without complication ([place])    Type 2   Family history of breast cancer 02/28/2020   Headache    migraines   History of pneumonia    Numbness    "left foot"    Pneumonia    Poor circulation     Family History  Problem Relation Age of Onset   Breast cancer Cousin        maternal; dx and d. <50   Breast cancer Cousin        maternal; dx and d. <50   Breast cancer Cousin        maternal; dx and d. <50   Breast cancer Cousin        maternal; dx and d. <  65   Cancer Paternal Aunt        unknown type cancer dx 13s    Past Surgical History:  Procedure Laterality Date   ABCESS DRAINAGE     "after cesarean- in hospital for 3 months"   ABDOMINAL HYSTERECTOMY     BACK SURGERY     CESAREAN SECTION  07/11/1968   CHOLECYSTECTOMY     COLONOSCOPY     DILATION AND CURETTAGE OF UTERUS     EYE SURGERY Right    MENISCUS REPAIR Bilateral    SIMPLE MASTECTOMY WITH AXILLARY SENTINEL NODE BIOPSY Right 04/13/2020   Procedure: RIGHT MASTECTOMY WITH RIGHT AXILLARY SENTINEL NODE BIOPSY;  Surgeon: Coralie Keens, MD;  Location: [facility];  Service: General;  Laterality: Right;   SPINE SURGERY     TOTAL KNEE ARTHROPLASTY Right 02/22/2021   Procedure: RIGHT TOTAL KNEE ARTHROPLASTY;  Surgeon: Leandrew Koyanagi, MD;  Location: [facility];  Service: Orthopedics;  Laterality: Right;   Social History   Occupational History   Occupation:  retired   Tobacco Use   Smoking status: Former    Packs/day: 0.50    Years: 10.00    Pack years: 5.00    Types: Cigarettes    Quit date: 07/11/1986    Years since quitting: 34.6   Smokeless tobacco: Never  Vaping Use   Vaping Use: Never used  Substance and Sexual Activity   Alcohol use: Yes    Comment: occassionally   Drug use: No   Sexual activity: Not on file

## 2021-03-09 NOTE — Telephone Encounter (Signed)
Left message with follow-up appointment per 8/26 staff message.

## 2021-03-11 ENCOUNTER — Ambulatory Visit (INDEPENDENT_AMBULATORY_CARE_PROVIDER_SITE_OTHER): Payer: Medicare Other | Admitting: Physical Therapy

## 2021-03-11 ENCOUNTER — Encounter: Payer: Self-pay | Admitting: Physical Therapy

## 2021-03-11 ENCOUNTER — Other Ambulatory Visit: Payer: Self-pay

## 2021-03-11 DIAGNOSIS — R600 Localized edema: Secondary | ICD-10-CM

## 2021-03-11 DIAGNOSIS — M25661 Stiffness of right knee, not elsewhere classified: Secondary | ICD-10-CM

## 2021-03-11 DIAGNOSIS — M25561 Pain in right knee: Secondary | ICD-10-CM | POA: Diagnosis not present

## 2021-03-11 DIAGNOSIS — R262 Difficulty in walking, not elsewhere classified: Secondary | ICD-10-CM | POA: Diagnosis not present

## 2021-03-11 NOTE — Therapy (Signed)
[facility] [address] [place], [place], [address] Phone: [phone]   Fax:  [phone]  Physical Therapy Treatment  Patient Details  Name: Judith Chavez MRN: [id number] Date of Birth: October 31, 1945 Referring Provider (PT): Gershon Mussel MD   Encounter Date: 03/11/2021   PT End of Session - 03/11/21 1435     Visit Number 2    Number of Visits 24    Date for PT Re-Evaluation 06/04/21    Progress Note Due on Visit 10    PT Start Time 1351    PT Stop Time 1430    PT Time Calculation (min) 39 min    Activity Tolerance Patient tolerated treatment well;No increased pain    Behavior During Therapy [facility] for tasks assessed/performed             Past Medical History:  Diagnosis Date   Anemia    as a teenager   Anxiety    Arthritis    knees   Cancer ([facility]) 03/2020   right breast DCIS, LCIS   Depression    Diabetes mellitus without complication ([facility])    Type 2   Family history of breast cancer 02/28/2020   Headache    migraines   History of pneumonia    Numbness    "left foot"    Pneumonia    Poor circulation     Past Surgical History:  Procedure Laterality Date   ABCESS DRAINAGE     "after cesarean- in hospital for 3 months"   ABDOMINAL HYSTERECTOMY     BACK SURGERY     CESAREAN SECTION  07/11/1968   CHOLECYSTECTOMY     COLONOSCOPY     DILATION AND CURETTAGE OF UTERUS     EYE SURGERY Right    MENISCUS REPAIR Bilateral    SIMPLE MASTECTOMY WITH AXILLARY SENTINEL NODE BIOPSY Right 04/13/2020   Procedure: RIGHT MASTECTOMY WITH RIGHT AXILLARY SENTINEL NODE BIOPSY;  Surgeon: Abigail Miyamoto, MD;  Location: [facility];  Service: General;  Laterality: Right;   SPINE SURGERY     TOTAL KNEE ARTHROPLASTY Right 02/22/2021   Procedure: RIGHT TOTAL KNEE ARTHROPLASTY;  Surgeon: Tarry Kos, MD;  [facility];  Service: Orthopedics;  Laterality: Right;    There were no vitals filed for this visit.   Subjective  Assessment - 03/11/21 1402     Subjective doing    Patient is accompained by: --   husband   Pertinent History Rt breast cancer DCIS, LCIS, ER/PR positive s/p mastectomy with 0/3 nodes removed on 04/13/20 Dr. Magnus Ivan and radiation status unknown.  . Other history includes anxiety, OA, hysterectomy, DM    Diagnostic tests X-ray    Patient Stated Goals Walk without walker, function without pain    Currently in Pain? Yes    Pain Score 5     Pain Location Knee    Pain Orientation Right    Pain Descriptors / Indicators Aching;Tightness;Sore    Pain Type Surgical pain    Pain Onset 1 to 4 weeks ago    Pain Frequency Constant    Aggravating Factors  bending, walking, prolonged sitting    Pain Relieving Factors changing positions, ice, pain meds                [facility] PT Assessment - 03/11/21 1410       Assessment   Medical Diagnosis M17.11 primary OA of right knee, R TKA    Referring Provider (PT) Gershon Mussel MD  Onset Date/Surgical Date 02/22/21                           [facility] Adult PT Treatment/Exercise - 03/11/21 1402       Knee/Hip Exercises: Stretches   Knee: Self-Stretch Limitations RLE 10 x 10 sec; with LLE overpressure    Other Knee/Hip Stretches seated tailgate flexion x 2 min      Knee/Hip Exercises: Aerobic   Nustep L6 x 8 min      Knee/Hip Exercises: Seated   Long Arc Quad Right;3 sets;10 reps;Weights    Long Arc Quad Weight 3 lbs.      Manual Therapy   Passive ROM Rt knee flexion with Lt knee extension in sitting                      PT Short Term Goals - 03/09/21 1150       PT SHORT TERM GOAL #1   Title Pt will be independent in her initial HEP.    Time 3    Period Weeks    Status New    Target Date 04/02/21      PT SHORT TERM GOAL #2   Title Pt will be able to perform sit to stand with no UE support from standard chair independently.    Baseline using bilateral UE for push off.    Time 3    Period Weeks    Status  New    Target Date 04/02/21               PT Long Term Goals - 03/09/21 1247       PT LONG TERM GOAL #1   Title Pt will be independent in her advanced HEP.    Time 12    Period Weeks    Status New    Target Date 06/04/21      PT LONG TERM GOAL #2   Title Pt will be able to perform 5 time sit to stand in </= 12 seconds with no UE support.    Time 12    Period Weeks    Status New    Target Date 06/04/21      PT LONG TERM GOAL #3   Title Pt will improve FOTO score to >/= 71%    Baseline 60% on 03/09/2021    Time 12    Period Weeks    Status New    Target Date 06/04/21      PT LONG TERM GOAL #4   Title Pt will perform right knee active ROM  0 to 120 degrees to improve functional mobility and gait.    Time 12    Period Weeks    Status New    Target Date 06/04/21      PT LONG TERM GOAL #5   Title Pt will be able to amb community surfaces without device with normalize gait pattern.    Time 12    Period Weeks    Status New    Target Date 06/04/21      Additional Long Term Goals   Additional Long Term Goals Yes      PT LONG TERM GOAL #6   Title Pt will be able to navigate 1 flight of stairs with single hand rail with step over step.    Time 12    Period Weeks    Status New  Plan - 03/11/21 1435     Clinical Impression Statement Pt tolerated session well today with main focus on Rt knee flexion today, as well as quad strengthening.  Anticipate she will be ready to progress to cane soon as she tries to walk with RW with one hand.  Overall progressing well with PT, no goals met as only 2nd visit.    Personal Factors and Comorbidities Comorbidity 3+    Comorbidities anemia, arthritis, anxiety, CA, DM, HA, pneumonia, abdominal hysterectomy back surgery , cesarean section, meniscus repair bilateral, rt mastectomy 2021    Examination-Activity Limitations Reach Overhead;Sleep    Examination-Participation Restrictions Cleaning;Meal Prep;Yard  Work;Community Activity;Driving;Shop;Laundry    Stability/Clinical Decision Making Stable/Uncomplicated    Rehab Potential Good    PT Frequency 2x / week    PT Duration 12 weeks    PT Treatment/Interventions ADLs/Self Care Home Management;Cryotherapy;Gait training;Stair training;Functional mobility training;Therapeutic activities;Therapeutic exercise;Balance training;Neuromuscular re-education;Patient/family education;Passive range of motion;Scar mobilization;Manual techniques;Dry needling;Taping;Vasopneumatic Device;Moist Heat    PT Next Visit Plan begin bike/nustep, flexion focused knee ROM, strengthening, gait training with cane    PT Home Exercise Plan Access Code: [email or web address]    Consulted and Agree with Plan of Care Patient             Patient will benefit from skilled therapeutic intervention in order to improve the following deficits and impairments:  Pain, Postural dysfunction, Decreased range of motion, Decreased activity tolerance, Difficulty walking, Decreased mobility, Decreased strength, Increased edema, Decreased balance  Visit Diagnosis: Acute pain of right knee  Stiffness of right knee, not elsewhere classified  Difficulty in walking, not elsewhere classified  Localized edema     Problem List Patient Active Problem List   Diagnosis Date Noted   Status post total right knee replacement 02/22/2021   History of right breast cancer 04/13/2020   Genetic testing 03/10/2020   Family history of breast cancer 02/28/2020   Malignant neoplasm of upper-outer quadrant of right breast in female, estrogen receptor positive ([place]) 02/26/2020   Primary osteoarthritis of right knee 03/20/2019   Primary osteoarthritis of left knee 03/20/2019   Primary osteoarthritis of both knees 04/24/2017   Spondylolisthesis at L3-L4 level 06/01/2015      Laureen Abrahams, PT, DPT 03/11/21 2:38 PM    [facility] [address] [place], [place],  [address] Phone: [phone]   Fax:  [phone]  Name: Sharnee Douglass MRN: [id number] Date of Birth: 1945-07-28

## 2021-03-16 ENCOUNTER — Other Ambulatory Visit: Payer: Self-pay

## 2021-03-16 ENCOUNTER — Telehealth: Payer: Self-pay | Admitting: Orthopaedic Surgery

## 2021-03-16 ENCOUNTER — Ambulatory Visit (HOSPITAL_COMMUNITY)
Admission: RE | Admit: 2021-03-16 | Discharge: 2021-03-16 | Disposition: A | Discharge status: Home / Self Care | Payer: Medicare Other | Source: Ambulatory Visit | Attending: Orthopaedic Surgery | Admitting: Orthopaedic Surgery

## 2021-03-16 DIAGNOSIS — M7989 Other specified soft tissue disorders: Secondary | ICD-10-CM | POA: Insufficient documentation

## 2021-03-16 DIAGNOSIS — Z96651 Presence of right artificial knee joint: Secondary | ICD-10-CM | POA: Insufficient documentation

## 2021-03-16 DIAGNOSIS — M79604 Pain in right leg: Secondary | ICD-10-CM | POA: Insufficient documentation

## 2021-03-16 NOTE — Progress Notes (Signed)
Right lower extremity venous duplex has been completed. Preliminary results can be found in CV Proc through chart review.  Results were faxed to Dr. Erlinda Hong.  03/16/21 2:23 PM Carlos Levering RVT

## 2021-03-16 NOTE — Telephone Encounter (Signed)
Yes that's fine to hold off tomorrow

## 2021-03-16 NOTE — Telephone Encounter (Signed)
I called and talked to the pt. She has below the knee swelling that started on Sunday. Pt just had sx one month ago so I feel a ultrasound to r/o DVT is needed. I will call to schedule this now.

## 2021-03-16 NOTE — Telephone Encounter (Signed)
Pt called had surgery 2 wk ago and right foot is swollen and pains. Please call pt as soon as possible at [phone].

## 2021-03-16 NOTE — Telephone Encounter (Signed)
Summary:  RIGHT:  - There is no evidence of deep vein thrombosis in the lower extremity.     - No cystic structure found in the popliteal fossa.     LEFT:  - No evidence of common femoral vein obstruction.      *See table(s) above for measurements and observations.    Please advise on lower extremity swelling .... Should she hold off on PT for tomorrow?

## 2021-03-16 NOTE — Telephone Encounter (Signed)
I got pt scheduled for today '@2pm'$ 

## 2021-03-17 ENCOUNTER — Encounter: Payer: Medicare Other | Admitting: Physical Therapy

## 2021-03-17 NOTE — Telephone Encounter (Signed)
I called pt and advised. She stated understanding. She was instructed to rest,  ice and elevate and if not better in a couple days then to give [place] a call.

## 2021-03-19 ENCOUNTER — Encounter: Payer: Self-pay | Admitting: Surgical

## 2021-03-19 ENCOUNTER — Encounter: Payer: Medicare Other | Admitting: Physical Therapy

## 2021-03-19 ENCOUNTER — Other Ambulatory Visit: Payer: Self-pay

## 2021-03-19 ENCOUNTER — Ambulatory Visit (INDEPENDENT_AMBULATORY_CARE_PROVIDER_SITE_OTHER): Payer: Medicare Other | Admitting: Physical Therapy

## 2021-03-19 ENCOUNTER — Encounter: Payer: Self-pay | Admitting: Physical Therapy

## 2021-03-19 ENCOUNTER — Other Ambulatory Visit: Payer: Self-pay | Admitting: Surgical

## 2021-03-19 ENCOUNTER — Ambulatory Visit (INDEPENDENT_AMBULATORY_CARE_PROVIDER_SITE_OTHER): Payer: Medicare Other

## 2021-03-19 ENCOUNTER — Ambulatory Visit (INDEPENDENT_AMBULATORY_CARE_PROVIDER_SITE_OTHER): Payer: Medicare Other | Admitting: Surgical

## 2021-03-19 DIAGNOSIS — M25661 Stiffness of right knee, not elsewhere classified: Secondary | ICD-10-CM

## 2021-03-19 DIAGNOSIS — R262 Difficulty in walking, not elsewhere classified: Secondary | ICD-10-CM

## 2021-03-19 DIAGNOSIS — Z96651 Presence of right artificial knee joint: Secondary | ICD-10-CM | POA: Diagnosis not present

## 2021-03-19 DIAGNOSIS — M25561 Pain in right knee: Secondary | ICD-10-CM

## 2021-03-19 DIAGNOSIS — R600 Localized edema: Secondary | ICD-10-CM

## 2021-03-19 MED ORDER — ACETAMINOPHEN ER 650 MG PO TBCR: 650.0000 mg | EXTENDED_RELEASE_TABLET | Freq: Three times a day (TID) | ORAL | 0 refills | Status: AC | PRN

## 2021-03-19 MED ORDER — ZOLPIDEM TARTRATE ER 6.25 MG PO TBCR: 6.2500 mg | EXTENDED_RELEASE_TABLET | Freq: Every evening | ORAL | 0 refills | Status: DC | PRN

## 2021-03-19 MED ORDER — GABAPENTIN 300 MG PO CAPS
300.0000 mg | ORAL_CAPSULE | Freq: Three times a day (TID) | ORAL | 0 refills | Status: DC
Start: 2021-03-19 — End: 2021-04-07

## 2021-03-19 MED ORDER — CELECOXIB 100 MG PO CAPS: 100.0000 mg | ORAL_CAPSULE | Freq: Two times a day (BID) | ORAL | 0 refills | Status: DC

## 2021-03-19 NOTE — Progress Notes (Signed)
Post-Op Visit Note   Patient: Judith Chavez           Date of Birth: 29-Jan-1946           MRN: [id number] Visit Date: 03/19/2021 PCP: Mayra Neer, MD   Assessment & Plan:  Chief Complaint:  Chief Complaint  Patient presents with   Right Knee - Post-op Follow-up   Visit Diagnoses:  1. Hx of total knee replacement, right     Plan: Patient is a 75 year old female who presents for evaluation of right knee pain following right total knee arthroplasty on 02/22/2021 by Dr. Erlinda Hong.  She complains of sharp shooting pain to the medial right knee.  She has not really been taking any medication for pain control as all the opioid medications she has tried has been causing her to feel hyperactive and have hallucinations.  Due to the pain and lack of pain control, she has been having difficulty sleeping which has become very physically and emotionally exhausting for her.  She has been able to keep up with physical therapy and progressed through the therapy exercises well though she feels that she sometimes has to just cry through the exercises.  She is ambulating with a cane at this point.  This all came to ahead today when she reported for physical therapy today but refused to participate as she felt she was in just too much pain.  She denies any elevated temperature at home over 99 degrees.  She does have some increased chills and occasional sweats after putting on several layers due to the chills.  She has no drainage from the incision or any chest pain/shortness of breath.  On exam she has no effusion noted.  Incision is healing well without any evidence of infection or dehiscence.  There is no exaggerated warmth about the knee and no sinus tract is noted.  No calf tenderness.  Negative Homans' sign.  She is able to extend to 0 degrees and flex to about 95 to 100 degrees.  She has no pain with passive range of motion until she reaches her terminal flexion.  Radiographs taken today show no acute  changes since immediate postop radiographs.  Impression is that patient is having some difficulty with pain control as she is not taking any pain medication at all.  She seems to be quite physically and emotionally exhausted with lack of pain control, lack of sleep, intensive physical therapy.  Encouraged her that she is doing quite well and there are no significantly concerning signs on exam.  Counseled her about the red flag symptoms to look out for.  With lack of effusion, changes in the appearance of the decision, pain with passive range of motion, no concern for surgical site infection at this time.  Plan to try and regain her pain control with nonopioid medication pain regimen consisting of Tylenol, Celebrex, gabapentin.  Ambien for sleep.  Follow-up with Dr. Erlinda Hong at her regularly scheduled appointment and call the office if there are any concerns in the meantime.  Follow-Up Instructions: No follow-ups on file.   Orders:  Orders Placed This Encounter  Procedures   XR Knee 1-2 Views Right   Meds ordered this encounter  Medications   gabapentin (NEURONTIN) 300 MG capsule    Sig: Take 1 capsule (300 mg total) by mouth 3 (three) times daily.    Dispense:  30 capsule    Refill:  0   celecoxib (CELEBREX) 100 MG capsule    Sig: Take 1  capsule (100 mg total) by mouth 2 (two) times daily.    Dispense:  60 capsule    Refill:  0   acetaminophen (TYLENOL 8 HOUR) 650 MG CR tablet    Sig: Take 1 tablet (650 mg total) by mouth every 8 (eight) hours as needed for pain.    Dispense:  60 tablet    Refill:  0   zolpidem (AMBIEN CR) 6.25 MG CR tablet    Sig: Take 1 tablet (6.25 mg total) by mouth at bedtime as needed for sleep. Dont take with Xanax or opioid medication    Dispense:  10 tablet    Refill:  0    Imaging: No results found.  PMFS History: Patient Active Problem List   Diagnosis Date Noted   Status post total right knee replacement 02/22/2021   History of right breast cancer 04/13/2020    Genetic testing 03/10/2020   Family history of breast cancer 02/28/2020   Malignant neoplasm of upper-outer quadrant of right breast in female, estrogen receptor positive ([place]) 02/26/2020   Primary osteoarthritis of right knee 03/20/2019   Primary osteoarthritis of left knee 03/20/2019   Primary osteoarthritis of both knees 04/24/2017   Spondylolisthesis at L3-L4 level 06/01/2015   Past Medical History:  Diagnosis Date   Anemia    as a teenager   Anxiety    Arthritis    knees   Cancer ([place]) 03/2020   right breast DCIS, LCIS   Depression    Diabetes mellitus without complication ([place])    Type 2   Family history of breast cancer 02/28/2020   Headache    migraines   History of pneumonia    Numbness    "left foot"    Pneumonia    Poor circulation     Family History  Problem Relation Age of Onset   Breast cancer Cousin        maternal; dx and d. <50   Breast cancer Cousin        maternal; dx and d. <50   Breast cancer Cousin        maternal; dx and d. <50   Breast cancer Cousin        maternal; dx and d. <50   Cancer Paternal Aunt        unknown type cancer dx 7s    Past Surgical History:  Procedure Laterality Date   ABCESS DRAINAGE     "after cesarean- in hospital for 3 months"   ABDOMINAL HYSTERECTOMY     BACK SURGERY     CESAREAN SECTION  07/11/1968   CHOLECYSTECTOMY     COLONOSCOPY     DILATION AND CURETTAGE OF UTERUS     EYE SURGERY Right    MENISCUS REPAIR Bilateral    SIMPLE MASTECTOMY WITH AXILLARY SENTINEL NODE BIOPSY Right 04/13/2020   Procedure: RIGHT MASTECTOMY WITH RIGHT AXILLARY SENTINEL NODE BIOPSY;  Surgeon: Coralie Keens, MD;  Location: [place];  Service: General;  Laterality: Right;   [place] Right 02/22/2021   Procedure: RIGHT TOTAL KNEE ARTHROPLASTY;  Surgeon: Leandrew Koyanagi, MD;  Location: [facility];  Service: Orthopedics;  Laterality: Right;   Social History   Occupational History    Occupation: retired   Tobacco Use   Smoking status: Former    Packs/day: 0.50    Years: 10.00    Pack years: 5.00    Types: Cigarettes    Quit date: 07/11/1986  Years since quitting: 34.7   Smokeless tobacco: Never  Vaping Use   Vaping Use: Never used  Substance and Sexual Activity   Alcohol use: Yes    Comment: occassionally   Drug use: No   Sexual activity: Not on file

## 2021-03-19 NOTE — Therapy (Signed)
[facility] Physical Therapy [address] [place], [place], [address] Phone: [phone]   Fax:  [phone]  Physical Therapy Treatment  Patient Details  Name: Judith Chavez MRN: [id number] Date of Birth: [birth year] Referring Provider (PT): Frankey Shown MD   Encounter Date: 03/19/2021    Past Medical History:  Diagnosis Date   Anemia    as a teenager   Anxiety    Arthritis    knees   Cancer ([place]) 03/2020   right breast DCIS, LCIS   Depression    Diabetes mellitus without complication ([place])    Type 2   Family history of breast cancer 02/28/2020   Headache    migraines   History of pneumonia    Numbness    "left foot"    Pneumonia    Poor circulation     Past Surgical History:  Procedure Laterality Date   ABCESS DRAINAGE     "after cesarean- in hospital for 3 months"   [place] [facility]  07/11/1968   CHOLECYSTECTOMY     COLONOSCOPY     DILATION AND CURETTAGE OF UTERUS     EYE SURGERY Right    MENISCUS REPAIR Bilateral    SIMPLE MASTECTOMY WITH AXILLARY SENTINEL NODE BIOPSY Right 04/13/2020   Procedure: RIGHT MASTECTOMY WITH RIGHT AXILLARY SENTINEL NODE BIOPSY;  Surgeon: Coralie Keens, MD;  Location: [place];  Service: General;  Laterality: Right;   [place] Right 02/22/2021   Procedure: RIGHT TOTAL KNEE ARTHROPLASTY;  Surgeon: Leandrew Koyanagi, MD;  Location: [place];  Service: Orthopedics;  Laterality: Right;    There were no vitals filed for this visit.   Subjective Assessment - 03/19/21 1528     Subjective See Plan                                          PT Short Term Goals - 03/09/21 1150       PT SHORT TERM GOAL #1   Title Pt will be independent in her initial HEP.    Time 3    Period Weeks    Status New    Target Date 04/02/21      PT SHORT TERM GOAL #2   Title Pt will be able to perform sit to  stand with no UE support from standard chair independently.    Baseline using bilateral UE for push off.    Time 3    Period Weeks    Status New    Target Date 04/02/21               PT Long Term Goals - 03/09/21 1247       PT LONG TERM GOAL #1   Title Pt will be independent in her advanced HEP.    Time 12    Period Weeks    Status New    Target Date 06/04/21      PT LONG TERM GOAL #2   Title Pt will be able to perform 5 time sit to stand in </= 12 seconds with no UE support.    Time 12    Period Weeks    Status New    Target Date 06/04/21      PT LONG TERM GOAL #3   Title Pt will  improve FOTO score to >/= 71%    Baseline 60% on 03/09/2021    Time 12    Period Weeks    Status New    Target Date 06/04/21      PT LONG TERM GOAL #4   Title Pt will perform right knee active ROM  0 to 120 degrees to improve functional mobility and gait.    Time 12    Period Weeks    Status New    Target Date 06/04/21      PT LONG TERM GOAL #5   Title Pt will be able to amb community surfaces without device with normalize gait pattern.    Time 12    Period Weeks    Status New    Target Date 06/04/21      Additional Long Term Goals   Additional Long Term Goals Yes      PT LONG TERM GOAL #6   Title Pt will be able to navigate 1 flight of stairs with single hand rail with step over step.    Time 12    Period Weeks    Status New                   Plan - 03/19/21 1528     Clinical Impression Statement Patient arrives reporting intractible pain. She was tearful reporting she can not deal with this pain any more. She reports the pain has been like this all day. She does not rememeber doing anything> She reprots Dr Erlinda Hong canceled her PT earlier in the week because her foot was swollen. She reports shooting pain down to her foot. She will be seen by a provider downstairs. PT visit will be canceled today.    Comorbidities anemia, arthritis, anxiety, CA, DM, HA, pneumonia,  abdominal hysterectomy back surgery , cesarean section, meniscus repair bilateral, rt mastectomy 2021             Patient will benefit from skilled therapeutic intervention in order to improve the following deficits and impairments:     Visit Diagnosis: Acute pain of right knee  Stiffness of right knee, not elsewhere classified  Difficulty in walking, not elsewhere classified  Localized edema     Problem List Patient Active Problem List   Diagnosis Date Noted   Status post total right knee replacement 02/22/2021   History of right breast cancer 04/13/2020   Genetic testing 03/10/2020   Family history of breast cancer 02/28/2020   Malignant neoplasm of upper-outer quadrant of right breast in female, estrogen receptor positive ([place]) 02/26/2020   Primary osteoarthritis of right knee 03/20/2019   Primary osteoarthritis of left knee 03/20/2019   Primary osteoarthritis of both knees 04/24/2017   Spondylolisthesis at L3-L4 level 06/01/2015    Carney Living, PT 03/19/2021, 3:31 PM  [facility] Physical Therapy [address] [place], [place], [address] Phone: [phone]   Fax:  [phone]  Name: Judith Chavez MRN: [id number] Date of Birth: 1946/06/12

## 2021-03-22 ENCOUNTER — Telehealth: Payer: Self-pay | Admitting: *Deleted

## 2021-03-22 ENCOUNTER — Ambulatory Visit: Payer: Medicare Other | Attending: Surgery

## 2021-03-22 ENCOUNTER — Other Ambulatory Visit: Payer: Self-pay

## 2021-03-22 VITALS — Wt 156.0 lb

## 2021-03-22 DIAGNOSIS — Z483 Aftercare following surgery for neoplasm: Secondary | ICD-10-CM | POA: Insufficient documentation

## 2021-03-22 NOTE — Therapy (Signed)
[place], [place], [address] Phone: [phone]   Fax:  [phone]  Physical Therapy Treatment  Patient Details  Name: Judith Chavez MRN: [id number] Date of Birth: May 10, 1946 Referring Provider (PT): Frankey Shown MD   Encounter Date: 03/22/2021   PT End of Session - 03/22/21 1702     Visit Number 2   # unchanged due to screen only   Number of Visits 24    PT Start Time 1700    PT Stop Time 1704    PT Time Calculation (min) 4 min    Activity Tolerance Patient tolerated treatment well    Behavior During Therapy [facility] for tasks assessed/performed             Past Medical History:  Diagnosis Date   Anemia    as a teenager   Anxiety    Arthritis    knees   Cancer ([place]) 03/2020   right breast DCIS, LCIS   Depression    Diabetes mellitus without complication ([place])    Type 2   Family history of breast cancer 02/28/2020   Headache    migraines   History of pneumonia    Numbness    "left foot"    Pneumonia    Poor circulation     Past Surgical History:  Procedure Laterality Date   ABCESS DRAINAGE     "after cesarean- in hospital for 3 months"   [place]  07/11/1968   CHOLECYSTECTOMY     COLONOSCOPY     DILATION AND CURETTAGE OF UTERUS     EYE SURGERY Right    MENISCUS REPAIR Bilateral    SIMPLE MASTECTOMY WITH AXILLARY SENTINEL NODE BIOPSY Right 04/13/2020   Procedure: RIGHT MASTECTOMY WITH RIGHT AXILLARY SENTINEL NODE BIOPSY;  Surgeon: Coralie Keens, MD;  Location: [place];  Service: General;  Laterality: Right;   [place] Right 02/22/2021   Procedure: RIGHT TOTAL KNEE ARTHROPLASTY;  Surgeon: Leandrew Koyanagi, MD;  Location: [place];  Service: Orthopedics;  Laterality: Right;    Vitals:   03/22/21 1701  Weight: 156 lb (70.8 kg)     Subjective Assessment - 03/22/21 1700      Subjective Pt returns for her 3 month L-Dex screen.                    L-DEX FLOWSHEETS - 03/22/21 1700       L-DEX LYMPHEDEMA SCREENING   Measurement Type Unilateral    L-DEX MEASUREMENT EXTREMITY Upper Extremity    POSITION  Standing    DOMINANT SIDE Right    At Risk Side Right    BASELINE SCORE (UNILATERAL) -4.4    L-DEX SCORE (UNILATERAL) -3.7    VALUE CHANGE (UNILAT) 0.7                                  PT Short Term Goals - 03/09/21 1150       PT SHORT TERM GOAL #1   Title Pt will be independent in her initial HEP.    Time 3    Period Weeks    Status New    Target Date 04/02/21      PT SHORT TERM GOAL #2   Title Pt will be able to perform sit to  stand with no UE support from standard chair independently.    Baseline using bilateral UE for push off.    Time 3    Period Weeks    Status New    Target Date 04/02/21               PT Long Term Goals - 03/09/21 1247       PT LONG TERM GOAL #1   Title Pt will be independent in her advanced HEP.    Time 12    Period Weeks    Status New    Target Date 06/04/21      PT LONG TERM GOAL #2   Title Pt will be able to perform 5 time sit to stand in </= 12 seconds with no UE support.    Time 12    Period Weeks    Status New    Target Date 06/04/21      PT LONG TERM GOAL #3   Title Pt will improve FOTO score to >/= 71%    Baseline 60% on 03/09/2021    Time 12    Period Weeks    Status New    Target Date 06/04/21      PT LONG TERM GOAL #4   Title Pt will perform right knee active ROM  0 to 120 degrees to improve functional mobility and gait.    Time 12    Period Weeks    Status New    Target Date 06/04/21      PT LONG TERM GOAL #5   Title Pt will be able to amb community surfaces without device with normalize gait pattern.    Time 12    Period Weeks    Status New    Target Date 06/04/21      Additional Long Term Goals   Additional Long Term Goals Yes      PT  LONG TERM GOAL #6   Title Pt will be able to navigate 1 flight of stairs with single hand rail with step over step.    Time 12    Period Weeks    Status New                   Plan - 03/22/21 1704     Clinical Impression Statement Pt returns for her 3 month L-Dex screen. Her change from baseline of 0.7 is WNLs os no furhter treatment is required at this time except to cont every 3 month L-Dex screens which pt is agreeabel to.    PT Next Visit Plan begin bike/nustep, flexion focused knee ROM, strengthening, gait training with cane; cont every 3 month L-Dex screens    Consulted and Agree with Plan of Care Patient             Patient will benefit from skilled therapeutic intervention in order to improve the following deficits and impairments:     Visit Diagnosis: Aftercare following surgery for neoplasm     Problem List Patient Active Problem List   Diagnosis Date Noted   Status post total right knee replacement 02/22/2021   History of right breast cancer 04/13/2020   Genetic testing 03/10/2020   Family history of breast cancer 02/28/2020   Malignant neoplasm of upper-outer quadrant of right breast in female, estrogen receptor positive ([place]) 02/26/2020   Primary osteoarthritis of right knee 03/20/2019   Primary osteoarthritis of left knee 03/20/2019   Primary osteoarthritis of both knees 04/24/2017   Spondylolisthesis at  L3-L4 level 06/01/2015    Otelia Limes, PTA 03/22/2021, 5:07 PM  [place], [place], [address] Phone: [phone]   Fax:  [phone]  Name: Judith Chavez MRN: [id number] Date of Birth: Nov 05, 1945

## 2021-03-22 NOTE — Telephone Encounter (Signed)
Ortho bundle 30 day call completed. °

## 2021-03-23 ENCOUNTER — Ambulatory Visit (INDEPENDENT_AMBULATORY_CARE_PROVIDER_SITE_OTHER): Payer: Medicare Other | Admitting: Physical Therapy

## 2021-03-23 ENCOUNTER — Encounter: Payer: Self-pay | Admitting: Physical Therapy

## 2021-03-23 DIAGNOSIS — M25561 Pain in right knee: Secondary | ICD-10-CM

## 2021-03-23 DIAGNOSIS — R262 Difficulty in walking, not elsewhere classified: Secondary | ICD-10-CM | POA: Diagnosis not present

## 2021-03-23 DIAGNOSIS — M25661 Stiffness of right knee, not elsewhere classified: Secondary | ICD-10-CM | POA: Diagnosis not present

## 2021-03-23 DIAGNOSIS — R600 Localized edema: Secondary | ICD-10-CM

## 2021-03-23 NOTE — Therapy (Signed)
[facility] Physical Therapy [address] [place], [place], [address] Phone: [phone]   Fax:  [phone]  Physical Therapy Treatment  Patient Details  Name: Judith Chavez MRN: [id number] Date of Birth: 1945/07/26 Referring Provider (PT): Frankey Shown MD   Encounter Date: 03/23/2021   PT End of Session - 03/23/21 1526     Visit Number 3    Number of Visits 24    Date for PT Re-Evaluation 06/04/21    Progress Note Due on Visit 10    PT Start Time [id number]    PT Stop Time 1555    PT Time Calculation (min) 40 min    Activity Tolerance Patient tolerated treatment well    Behavior During Therapy [facility] for tasks assessed/performed             Past Medical History:  Diagnosis Date   Anemia    as a teenager   Anxiety    Arthritis    knees   Cancer ([place]) 03/2020   right breast DCIS, LCIS   Depression    Diabetes mellitus without complication ([place])    Type 2   Family history of breast cancer 02/28/2020   Headache    migraines   History of pneumonia    Numbness    "left foot"    Pneumonia    Poor circulation     Past Surgical History:  Procedure Laterality Date   ABCESS DRAINAGE     "after cesarean- in hospital for 3 months"   [place]  07/11/1968   CHOLECYSTECTOMY     COLONOSCOPY     DILATION AND CURETTAGE OF UTERUS     EYE SURGERY Right    MENISCUS REPAIR Bilateral    SIMPLE MASTECTOMY WITH AXILLARY SENTINEL NODE BIOPSY Right 04/13/2020   Procedure: RIGHT MASTECTOMY WITH RIGHT AXILLARY SENTINEL NODE BIOPSY;  Surgeon: Coralie Keens, MD;  Location: [place];  Service: General;  Laterality: Right;   [place] Right 02/22/2021   Procedure: RIGHT TOTAL KNEE ARTHROPLASTY;  Surgeon: Leandrew Koyanagi, MD;  Location: [place];  Service: Orthopedics;  Laterality: Right;    There were no vitals filed for this visit.   Subjective Assessment - 03/23/21  1525     Subjective Pt arriving reporting no pain today at rest in her right knee    Pertinent History Rt breast cancer DCIS, LCIS, ER/PR positive s/p mastectomy with 0/3 nodes removed on 04/13/20 Dr. Ninfa Linden and radiation status unknown.  . Other history includes anxiety, OA, hysterectomy, DM    Diagnostic tests X-ray    Patient Stated Goals Walk without walker, function without pain    Currently in Pain? No/denies                [facility] PT Assessment - 03/23/21 0001       Assessment   Medical Diagnosis M17.11 primary OA of right knee, R TKA    Referring Provider (PT) Frankey Shown MD    Onset Date/Surgical Date 02/22/21      AROM   Right/Left Knee Right    Right Knee Extension -8    Right Knee Flexion 112      PROM   Right Knee Extension -6    Right Knee Flexion 114  [facility] Adult PT Treatment/Exercise - 03/23/21 0001       Exercises   Exercises Knee/Hip      Knee/Hip Exercises: Stretches   Gastroc Stretch 2 reps;30 seconds      Knee/Hip Exercises: Aerobic   Recumbent Bike seat at 4, partial revolution x 7 minutes      Knee/Hip Exercises: Standing   Forward Step Up Both;15 reps;Step Height: 4";Hand Hold: 1      Knee/Hip Exercises: Seated   Long Arc Quad Right;3 sets;10 reps;Weights    Long Arc Quad Weight 3 lbs.    Heel Slides Limitations Tailgate: using Left LE to push Rt LE into flexion holding 10 seconds each x 10 reps    Sit to Sand 10 reps;without UE support      Manual Therapy   Passive ROM Rt knee flexion with Lt knee extension in sitting                       PT Short Term Goals - 03/23/21 1540       PT SHORT TERM GOAL #1   Title Pt will be independent in her initial HEP.    Status On-going      PT SHORT TERM GOAL #2   Title Pt will be able to perform sit to stand with no UE support from standard chair independently.    Status Achieved               PT Long Term Goals - 03/09/21  1247       PT LONG TERM GOAL #1   Title Pt will be independent in her advanced HEP.    Time 12    Period Weeks    Status New    Target Date 06/04/21      PT LONG TERM GOAL #2   Title Pt will be able to perform 5 time sit to stand in </= 12 seconds with no UE support.    Time 12    Period Weeks    Status New    Target Date 06/04/21      PT LONG TERM GOAL #3   Title Pt will improve FOTO score to >/= 71%    Baseline 60% on 03/09/2021    Time 12    Period Weeks    Status New    Target Date 06/04/21      PT LONG TERM GOAL #4   Title Pt will perform right knee active ROM  0 to 120 degrees to improve functional mobility and gait.    Time 12    Period Weeks    Status New    Target Date 06/04/21      PT LONG TERM GOAL #5   Title Pt will be able to amb community surfaces without device with normalize gait pattern.    Time 12    Period Weeks    Status New    Target Date 06/04/21      Additional Long Term Goals   Additional Long Term Goals Yes      PT LONG TERM GOAL #6   Title Pt will be able to navigate 1 flight of stairs with single hand rail with step over step.    Time 12    Period Weeks    Status New                   Plan - 03/23/21 1529     Clinical Impression Statement Pt  arriving today reporting no pain at beginning of session. Pt stating as long as she keeps moving her pain is better. Pt tolerating exericses well focusing on AROM and strengthening. AROM right knee arc  measured 8-112 degrees. Continue skilled PT to maximize function.    Personal Factors and Comorbidities Comorbidity 3+    Comorbidities anemia, arthritis, anxiety, CA, DM, HA, pneumonia, abdominal hysterectomy back surgery , cesarean section, meniscus repair bilateral, rt mastectomy 2021    Examination-Activity Limitations Reach Overhead;Sleep    Examination-Participation Restrictions Cleaning;Meal Prep;Yard Work;Community Activity;Driving;Shop;Laundry    Stability/Clinical Decision Making  Stable/Uncomplicated    Rehab Potential Good    PT Frequency 2x / week    PT Duration 12 weeks    PT Treatment/Interventions ADLs/Self Care Home Management;Cryotherapy;Gait training;Stair training;Functional mobility training;Therapeutic activities;Therapeutic exercise;Balance training;Neuromuscular re-education;Patient/family education;Passive range of motion;Scar mobilization;Manual techniques;Dry needling;Taping;Vasopneumatic Device;Moist Heat    PT Next Visit Plan continue bike progressing toward full revolution, flexion focused knee ROM, strengthening, gait training with cane; cont every 3 month L-Dex screens    PT Home Exercise Plan Access Code: [email or web address]    Consulted and Agree with Plan of Care Patient             Patient will benefit from skilled therapeutic intervention in order to improve the following deficits and impairments:  Pain, Postural dysfunction, Decreased range of motion, Decreased activity tolerance, Difficulty walking, Decreased mobility, Decreased strength, Increased edema, Decreased balance  Visit Diagnosis: Stiffness of right knee, not elsewhere classified  Difficulty in walking, not elsewhere classified  Localized edema  Acute pain of right knee     Problem List Patient Active Problem List   Diagnosis Date Noted   Status post total right knee replacement 02/22/2021   History of right breast cancer 04/13/2020   Genetic testing 03/10/2020   Family history of breast cancer 02/28/2020   Malignant neoplasm of upper-outer quadrant of right breast in female, estrogen receptor positive ([place]) 02/26/2020   Primary osteoarthritis of right knee 03/20/2019   Primary osteoarthritis of left knee 03/20/2019   Primary osteoarthritis of both knees 04/24/2017   Spondylolisthesis at L3-L4 level 06/01/2015    Oretha Caprice, PT, MPT 03/23/2021, 3:57 PM  [facility] Physical Therapy [address] [place], [place], [address] Phone: [phone]    Fax:  [phone]  Name: Rebakah Wilmer MRN: [id number] Date of Birth: 02/13/1946

## 2021-03-24 ENCOUNTER — Ambulatory Visit (INDEPENDENT_AMBULATORY_CARE_PROVIDER_SITE_OTHER): Payer: Medicare Other | Admitting: Physical Therapy

## 2021-03-24 ENCOUNTER — Other Ambulatory Visit: Payer: Self-pay

## 2021-03-24 DIAGNOSIS — M25661 Stiffness of right knee, not elsewhere classified: Secondary | ICD-10-CM

## 2021-03-24 DIAGNOSIS — R262 Difficulty in walking, not elsewhere classified: Secondary | ICD-10-CM

## 2021-03-24 DIAGNOSIS — R600 Localized edema: Secondary | ICD-10-CM

## 2021-03-24 DIAGNOSIS — M25561 Pain in right knee: Secondary | ICD-10-CM

## 2021-03-24 NOTE — Therapy (Signed)
[facility] Physical Therapy [address] [place], [place], [address] Phone: [phone]   Fax:  [phone]  Physical Therapy Treatment  Patient Details  Name: Judith Chavez MRN: [id number] Date of Birth: 1945-12-14 Referring Provider (PT): Frankey Shown MD   Encounter Date: 03/24/2021   PT End of Session - 03/24/21 1152     Visit Number 4    Number of Visits 24    Date for PT Re-Evaluation 06/04/21    Progress Note Due on Visit 10    PT Start Time [id number]    PT Stop Time 1236    PT Time Calculation (min) 43 min    Activity Tolerance Patient tolerated treatment well    Behavior During Therapy [facility] for tasks assessed/performed             Past Medical History:  Diagnosis Date   Anemia    as a teenager   Anxiety    Arthritis    knees   Cancer ([place]) 03/2020   right breast DCIS, LCIS   Depression    Diabetes mellitus without complication ([place])    Type 2   Family history of breast cancer 02/28/2020   Headache    migraines   History of pneumonia    Numbness    "left foot"    Pneumonia    Poor circulation     Past Surgical History:  Procedure Laterality Date   ABCESS DRAINAGE     "after cesarean- in hospital for 3 months"   [place]  07/11/1968   CHOLECYSTECTOMY     COLONOSCOPY     DILATION AND CURETTAGE OF UTERUS     EYE SURGERY Right    MENISCUS REPAIR Bilateral    SIMPLE MASTECTOMY WITH AXILLARY SENTINEL NODE BIOPSY Right 04/13/2020   Procedure: RIGHT MASTECTOMY WITH RIGHT AXILLARY SENTINEL NODE BIOPSY;  Surgeon: Coralie Keens, MD;  Location: [place];  Service: General;  Laterality: Right;   [place] Right 02/22/2021   Procedure: RIGHT TOTAL KNEE ARTHROPLASTY;  Surgeon: Leandrew Koyanagi, MD;  Location: [place];  Service: Orthopedics;  Laterality: Right;    There were no vitals filed for this visit.   Subjective Assessment - 03/24/21  1202     Subjective Stiffness today since I was just here yesterday.    Pertinent History Rt breast cancer DCIS, LCIS, ER/PR positive s/p mastectomy with 0/3 nodes removed on 04/13/20 Dr. Ninfa Linden and radiation status unknown.  . Other history includes anxiety, OA, hysterectomy, DM    Diagnostic tests X-ray    Patient Stated Goals Walk without walker, function without pain    Currently in Pain? Yes    Pain Score 4     Pain Location Knee    Pain Orientation Right    Pain Descriptors / Indicators Tightness    Pain Type Surgical pain                               [facility] Adult PT Treatment/Exercise - 03/24/21 0001       Self-Care   Self-Care Scar Mobilizations    Scar Mobilizations educated pt on scar massage; may need further education.      Knee/Hip Exercises: Stretches   Passive Hamstring Stretch Right;1 rep;60 seconds    Passive Hamstring Stretch Limitations in supine    Knee: Self-Stretch  to increase Flexion 5 reps;Right    Knee: Self-Stretch Limitations on step; also felt gastroc stretch here    Other Knee/Hip Stretches seated tailgate flexion 3x 30 sec min      Knee/Hip Exercises: Aerobic   Recumbent Bike seat 4 full revolution x 4 min after Nustep    Nustep L6 x 5 min seat 4 for ROM      Knee/Hip Exercises: Standing   Forward Step Up Both;20 reps;Hand Hold: 1;Step Height: 6"      Knee/Hip Exercises: Seated   Long Arc Quad Right;3 sets;10 reps    Long Arc Quad Weight 4 lbs.      Manual Therapy   Manual Therapy Other (comment);Joint mobilization    Joint Mobilization patellar mobs inf/sup and med/lat    Passive ROM left knee extension in supine    Other Manual Therapy scar massage                     PT Education - 03/24/21 1259     Education Details scar massage and active knee flexion stretch on step    Person(s) Educated Patient    Methods Explanation;Demonstration;Tactile cues;Verbal cues;Handout    Comprehension Verbalized  understanding;Returned demonstration              PT Short Term Goals - 03/23/21 1540       PT SHORT TERM GOAL #1   Title Pt will be independent in her initial HEP.    Status On-going      PT SHORT TERM GOAL #2   Title Pt will be able to perform sit to stand with no UE support from standard chair independently.    Status Achieved               PT Long Term Goals - 03/09/21 1247       PT LONG TERM GOAL #1   Title Pt will be independent in her advanced HEP.    Time 12    Period Weeks    Status New    Target Date 06/04/21      PT LONG TERM GOAL #2   Title Pt will be able to perform 5 time sit to stand in </= 12 seconds with no UE support.    Time 12    Period Weeks    Status New    Target Date 06/04/21      PT LONG TERM GOAL #3   Title Pt will improve FOTO score to >/= 71%    Baseline 60% on 03/09/2021    Time 12    Period Weeks    Status New    Target Date 06/04/21      PT LONG TERM GOAL #4   Title Pt will perform right knee active ROM  0 to 120 degrees to improve functional mobility and gait.    Time 12    Period Weeks    Status New    Target Date 06/04/21      PT LONG TERM GOAL #5   Title Pt will be able to amb community surfaces without device with normalize gait pattern.    Time 12    Period Weeks    Status New    Target Date 06/04/21      Additional Long Term Goals   Additional Long Term Goals Yes      PT LONG TERM GOAL #6   Title Pt will be able to navigate 1 flight of stairs with single  hand rail with step over step.    Time 12    Period Weeks    Status New                   Plan - 03/24/21 1257     Clinical Impression Statement Patient reporting stiffness today due to yesterday's treatment. She did well with TE and was able to progress with weight, ROM(full revolution on bike after Nustep)  and step height.    Comorbidities anemia, arthritis, anxiety, CA, DM, HA, pneumonia, abdominal hysterectomy back surgery , cesarean  section, meniscus repair bilateral, rt mastectomy 2021    PT Treatment/Interventions ADLs/Self Care Home Management;Cryotherapy;Gait training;Stair training;Functional mobility training;Therapeutic activities;Therapeutic exercise;Balance training;Neuromuscular re-education;Patient/family education;Passive range of motion;Scar mobilization;Manual techniques;Dry needling;Taping;Vasopneumatic Device;Moist Heat    PT Next Visit Plan flexion focused knee ROM, strengthening, gait training with cane; cont every 3 month L-Dex screens             Patient will benefit from skilled therapeutic intervention in order to improve the following deficits and impairments:  Pain, Postural dysfunction, Decreased range of motion, Decreased activity tolerance, Difficulty walking, Decreased mobility, Decreased strength, Increased edema, Decreased balance  Visit Diagnosis: Stiffness of right knee, not elsewhere classified  Difficulty in walking, not elsewhere classified  Localized edema  Acute pain of right knee     Problem List Patient Active Problem List   Diagnosis Date Noted   Status post total right knee replacement 02/22/2021   History of right breast cancer 04/13/2020   Genetic testing 03/10/2020   Family history of breast cancer 02/28/2020   Malignant neoplasm of upper-outer quadrant of right breast in female, estrogen receptor positive ([place]) 02/26/2020   Primary osteoarthritis of right knee 03/20/2019   Primary osteoarthritis of left knee 03/20/2019   Primary osteoarthritis of both knees 04/24/2017   Spondylolisthesis at L3-L4 level 06/01/2015   Madelyn Flavors, PT 03/24/2021, 2:56 PM  [facility] Physical Therapy [address] [place], [place], [address] Phone: [phone]   Fax:  [phone]  Name: Judith Chavez MRN: [id number] Date of Birth: 1946/04/11

## 2021-03-24 NOTE — Patient Instructions (Signed)
Access Code: [email or web address] URL: [email or web address] Date: 03/24/2021 Prepared by: Almyra Free  Program Notes Sit on the edge of a chair or bed with legs hanging down.  Swing legs back and forth for 2-3 minutes.    Exercises Long Sitting Quad Set with Towel Roll Under Heel - 3 x daily - 7 x weekly - 2 sets - 10 reps - 5 seconds hold Supine Active Straight Leg Raise - 3 x daily - 7 x weekly - 2 sets - 10 reps Sit to Stand with Counter Support - 3 x daily - 7 x weekly - 2 sets - 10 reps Seated Heel Slide - 3 x daily - 7 x weekly - 2 sets - 10 reps - 5 seconds hold Seated Long Arc Quad - 3 x daily - 7 x weekly - 2 sets - 10 reps - 5 seconds hold Supine Heel Slide with Strap - 2-3 x daily - 7 x weekly - 2 sets - 10 reps - 5 seconds hold Seated Knee Flexion AAROM - 2-3 x daily - 7 x weekly - 1-2 sets - 10 reps - 10 sec hold Recumbent Bike Step Up - 1-2 x daily - 7 x weekly - 10 reps Standing Knee Flexion Stretch on Step - 2 x daily - 7 x weekly - 2 sets - 10 reps

## 2021-03-27 NOTE — Telephone Encounter (Signed)
Recommend discuss with PCP

## 2021-03-29 ENCOUNTER — Encounter: Payer: Self-pay | Admitting: Rehabilitative and Restorative Service Providers"

## 2021-03-29 ENCOUNTER — Other Ambulatory Visit: Payer: Self-pay

## 2021-03-29 ENCOUNTER — Ambulatory Visit (INDEPENDENT_AMBULATORY_CARE_PROVIDER_SITE_OTHER): Payer: Medicare Other | Admitting: Rehabilitative and Restorative Service Providers"

## 2021-03-29 DIAGNOSIS — R600 Localized edema: Secondary | ICD-10-CM | POA: Diagnosis not present

## 2021-03-29 DIAGNOSIS — R262 Difficulty in walking, not elsewhere classified: Secondary | ICD-10-CM | POA: Diagnosis not present

## 2021-03-29 DIAGNOSIS — M25661 Stiffness of right knee, not elsewhere classified: Secondary | ICD-10-CM | POA: Diagnosis not present

## 2021-03-29 DIAGNOSIS — M25561 Pain in right knee: Secondary | ICD-10-CM | POA: Diagnosis not present

## 2021-03-29 NOTE — Therapy (Signed)
[facility] Physical Therapy [address] [place], [place], [address] Phone: [phone]   Fax:  [phone]  Physical Therapy Treatment  Patient Details  Name: Judith Chavez MRN: [id number] Date of Birth: [birth year] Referring Provider (PT): Frankey Shown MD   Encounter Date: 03/29/2021   PT End of Session - 03/29/21 1154     Visit Number 5    Number of Visits 24    Date for PT Re-Evaluation 06/04/21    Progress Note Due on Visit 10    PT Start Time 1101    PT Stop Time 1154    PT Time Calculation (min) 53 min    Activity Tolerance Patient tolerated treatment well    Behavior During Therapy [facility] for tasks assessed/performed             Past Medical History:  Diagnosis Date   Anemia    as a teenager   Anxiety    Arthritis    knees   Cancer ([place]) 03/2020   right breast DCIS, LCIS   Depression    Diabetes mellitus without complication ([place])    Type 2   Family history of breast cancer 02/28/2020   Headache    migraines   History of pneumonia    Numbness    "left foot"    Pneumonia    Poor circulation     Past Surgical History:  Procedure Laterality Date   ABCESS DRAINAGE     "after cesarean- in hospital for 3 months"   [place]  07/11/1968   CHOLECYSTECTOMY     COLONOSCOPY     DILATION AND CURETTAGE OF UTERUS     EYE SURGERY Right    MENISCUS REPAIR Bilateral    SIMPLE MASTECTOMY WITH AXILLARY SENTINEL NODE BIOPSY Right 04/13/2020   Procedure: RIGHT MASTECTOMY WITH RIGHT AXILLARY SENTINEL NODE BIOPSY;  Surgeon: Coralie Keens, MD;  Location: [place];  Service: General;  Laterality: Right;   [place] Right 02/22/2021   Procedure: RIGHT TOTAL KNEE ARTHROPLASTY;  Surgeon: Leandrew Koyanagi, MD;  Location: [place];  Service: Orthopedics;  Laterality: Right;    There were no vitals filed for this visit.   Subjective Assessment - 03/29/21  1128     Subjective Pt. indicated she had a tough weekend with swelling, difficulty sleeping, pain in leg.  Pt. stated 4/10 today c complaints of medial joint line sharp pains as well as continued swelling.  Reported having warmth to touch as well as low grade fever in low 99s at one time this weekend.    Pertinent History Rt breast cancer DCIS, LCIS, ER/PR positive s/p mastectomy with 0/3 nodes removed on 04/13/20 Dr. Ninfa Linden and radiation status unknown.  . Other history includes anxiety, OA, hysterectomy, DM    Diagnostic tests X-ray    Patient Stated Goals Walk without walker, function without pain    Currently in Pain? Yes    Pain Score 4     Pain Location Knee    Pain Orientation Right    Pain Descriptors / Indicators Tightness;Constant    Pain Type Surgical pain    Pain Onset More than a month ago    Pain Frequency Constant    Aggravating Factors  general constant pain    Pain Relieving Factors ice, medicine  [place] Adult PT Treatment/Exercise - 03/29/21 0001       Self-Care   Self-Care Other Self-Care Comments    Other Self-Care Comments  Self care education on continued use of icing for symptom relief and swelling as well as elevation and muscle pump activation (ankle pumps, supine LAQ).      Knee/Hip Exercises: Aerobic   Nustep Lvl 5 10 mins UE/LE      Knee/Hip Exercises: Seated   Long Arc Quad Right   2 x 10, c pause in flexion/extension stretch   Long Arc Quad Weight 4 lbs.      Knee/Hip Exercises: Supine   Other Supine Knee/Hip Exercises supine LAQ in 90 deg hip flexion 3 x 10 Rt (for swelling reduction use at home)    Other Supine Knee/Hip Exercises supine heel slide c SLR combo x 10 Rt                     PT Education - 03/29/21 1135     Education Details see self care    Person(s) Educated Patient    Methods Explanation;Demonstration;Verbal cues    Comprehension Verbalized understanding;Returned  demonstration              PT Short Term Goals - 03/29/21 1135       PT SHORT TERM GOAL #1   Title Pt will be independent in her initial HEP.    Status Achieved      PT SHORT TERM GOAL #2   Title Pt will be able to perform sit to stand with no UE support from standard chair independently.    Status Achieved               PT Long Term Goals - 03/09/21 1247       PT LONG TERM GOAL #1   Title Pt will be independent in her advanced HEP.    Time 12    Period Weeks    Status New    Target Date 06/04/21      PT LONG TERM GOAL #2   Title Pt will be able to perform 5 time sit to stand in </= 12 seconds with no UE support.    Time 12    Period Weeks    Status New    Target Date 06/04/21      PT LONG TERM GOAL #3   Title Pt will improve FOTO score to >/= 71%    Baseline 60% on 03/09/2021    Time 12    Period Weeks    Status New    Target Date 06/04/21      PT LONG TERM GOAL #4   Title Pt will perform right knee active ROM  0 to 120 degrees to improve functional mobility and gait.    Time 12    Period Weeks    Status New    Target Date 06/04/21      PT LONG TERM GOAL #5   Title Pt will be able to amb community surfaces without device with normalize gait pattern.    Time 12    Period Weeks    Status New    Target Date 06/04/21      Additional Long Term Goals   Additional Long Term Goals Yes      PT LONG TERM GOAL #6   Title Pt will be able to navigate 1 flight of stairs with single hand rail with step over step.    Time  12    Period Weeks    Status New                   Plan - 03/29/21 1135     Clinical Impression Statement No visible red streaking or incision discharge that would warrant concerns of infection.  Light touch did show mild warmth increase compared to Lt at this time c visible edema noted.  Spent additional time today review edema control activity as well as extension based gain stretching c heel prop.  Avoiding standing WB  activity today due to complaints.    Comorbidities anemia, arthritis, anxiety, CA, DM, HA, pneumonia, abdominal hysterectomy back surgery , cesarean section, meniscus repair bilateral, rt mastectomy 2021    PT Treatment/Interventions ADLs/Self Care Home Management;Cryotherapy;Gait training;Stair training;Functional mobility training;Therapeutic activities;Therapeutic exercise;Balance training;Neuromuscular re-education;Patient/family education;Passive range of motion;Scar mobilization;Manual techniques;Dry needling;Taping;Vasopneumatic Device;Moist Heat    PT Next Visit Plan Continued edema reduction interventions, progressing mobility and functional quad strength as able. vaso    PT Home Exercise Plan Access Code: [email or web address]    Consulted and Agree with Plan of Care Patient             Patient will benefit from skilled therapeutic intervention in order to improve the following deficits and impairments:  Pain, Postural dysfunction, Decreased range of motion, Decreased activity tolerance, Difficulty walking, Decreased mobility, Decreased strength, Increased edema, Decreased balance  Visit Diagnosis: Acute pain of right knee  Stiffness of right knee, not elsewhere classified  Difficulty in walking, not elsewhere classified  Localized edema     Problem List Patient Active Problem List   Diagnosis Date Noted   Status post total right knee replacement 02/22/2021   History of right breast cancer 04/13/2020   Genetic testing 03/10/2020   Family history of breast cancer 02/28/2020   Malignant neoplasm of upper-outer quadrant of right breast in female, estrogen receptor positive ([place]) 02/26/2020   Primary osteoarthritis of right knee 03/20/2019   Primary osteoarthritis of left knee 03/20/2019   Primary osteoarthritis of both knees 04/24/2017   Spondylolisthesis at L3-L4 level 06/01/2015   Scot Jun, PT, DPT, OCS, ATC 03/29/21  11:58 AM    [facility] Physical  Therapy [address] [place], [place], [address] Phone: [phone]   Fax:  [phone]  Name: Kailia Devilla MRN: [id number] Date of Birth: [birth year]

## 2021-03-31 ENCOUNTER — Ambulatory Visit (INDEPENDENT_AMBULATORY_CARE_PROVIDER_SITE_OTHER): Payer: Medicare Other | Admitting: Physical Therapy

## 2021-03-31 ENCOUNTER — Ambulatory Visit: Payer: Medicare Other | Admitting: Orthopaedic Surgery

## 2021-03-31 ENCOUNTER — Other Ambulatory Visit: Payer: Self-pay

## 2021-03-31 DIAGNOSIS — M25661 Stiffness of right knee, not elsewhere classified: Secondary | ICD-10-CM | POA: Diagnosis not present

## 2021-03-31 DIAGNOSIS — M25561 Pain in right knee: Secondary | ICD-10-CM | POA: Diagnosis not present

## 2021-03-31 DIAGNOSIS — R262 Difficulty in walking, not elsewhere classified: Secondary | ICD-10-CM

## 2021-03-31 DIAGNOSIS — R600 Localized edema: Secondary | ICD-10-CM

## 2021-03-31 NOTE — Therapy (Signed)
[facility] Physical Therapy [address] [place], [place], [address] Phone: [phone]   Fax:  [phone]  Physical Therapy Treatment  Patient Details  Name: Judith Chavez MRN: [id number] Date of Birth: 01-19-1946 Referring Provider (PT): Frankey Shown MD   Encounter Date: 03/31/2021   PT End of Session - 03/31/21 1208     Visit Number 6    Number of Visits 24    Date for PT Re-Evaluation 06/04/21    Progress Note Due on Visit 10    PT Start Time 3149    PT Stop Time 1110    PT Time Calculation (min) 55 min    Activity Tolerance Patient tolerated treatment well    Behavior During Therapy [facility] for tasks assessed/performed             Past Medical History:  Diagnosis Date   Anemia    as a teenager   Anxiety    Arthritis    knees   Cancer ([place]) 03/2020   right breast DCIS, LCIS   Depression    Diabetes mellitus without complication ([place])    Type 2   Family history of breast cancer 02/28/2020   Headache    migraines   History of pneumonia    Numbness    "left foot"    Pneumonia    Poor circulation     Past Surgical History:  Procedure Laterality Date   ABCESS DRAINAGE     "after cesarean- in hospital for 3 months"   [place]  07/11/1968   CHOLECYSTECTOMY     COLONOSCOPY     DILATION AND CURETTAGE OF UTERUS     EYE SURGERY Right    MENISCUS REPAIR Bilateral    SIMPLE MASTECTOMY WITH AXILLARY SENTINEL NODE BIOPSY Right 04/13/2020   Procedure: RIGHT MASTECTOMY WITH RIGHT AXILLARY SENTINEL NODE BIOPSY;  Surgeon: Coralie Keens, MD;  Location: [place];  Service: General;  Laterality: Right;   [place] Right 02/22/2021   Procedure: RIGHT TOTAL KNEE ARTHROPLASTY;  Surgeon: Leandrew Koyanagi, MD;  Location: [place];  Service: Orthopedics;  Laterality: Right;    There were no vitals filed for this visit.   Subjective Assessment - 03/31/21  1207     Subjective Pt arriving today in almost tears from her her right knee pain.    Pertinent History Rt breast cancer DCIS, LCIS, ER/PR positive s/p mastectomy with 0/3 nodes removed on 04/13/20 Dr. Ninfa Linden and radiation status unknown.  . Other history includes anxiety, OA, hysterectomy, DM    Diagnostic tests X-ray    Patient Stated Goals Walk without walker, function without pain    Currently in Pain? Yes    Pain Score 7     Pain Location Knee    Pain Orientation Right    Pain Descriptors / Indicators Aching;Sore    Pain Type Surgical pain    Pain Onset More than a month ago    Pain Frequency Constant                [facility] PT Assessment - 03/31/21 0001       Assessment   Medical Diagnosis M17.11 primary OA of right knee, R TKA    Referring Provider (PT) Frankey Shown MD    Onset Date/Surgical Date 02/22/21      AROM   Right/Left Knee Right    Right Knee Extension -  6    Right Knee Flexion 112                           [facility] Adult PT Treatment/Exercise - 03/31/21 0001       Knee/Hip Exercises: Aerobic   Nustep L6 x 6 minutes      Knee/Hip Exercises: Seated   Long Arc Quad Right   2 x 10, c pause in flexion/extension stretch   Long Arc Quad Weight 4 lbs.    Other Seated Knee/Hip Exercises seated SLR x 10      Knee/Hip Exercises: Supine   Heel Slides AROM;Right      Modalities   Modalities Vasopneumatic      Vasopneumatic   Number Minutes Vasopneumatic  15 minutes    Vasopnuematic Location  Knee    Vasopneumatic Pressure Medium    Vasopneumatic Temperature  34      Manual Therapy   Passive ROM PROM knee extension with over pressure    Other Manual Therapy scar massage, edema massage with intructions how to activate lymphatic system                       PT Short Term Goals - 03/31/21 1215       PT SHORT TERM GOAL #1   Title Pt will be independent in her initial HEP.    Status Achieved      PT SHORT TERM GOAL #2    Title Pt will be able to perform sit to stand with no UE support from standard chair independently.    Status Achieved               PT Long Term Goals - 03/31/21 1216       PT LONG TERM GOAL #1   Title Pt will be independent in her advanced HEP.    Status On-going      PT LONG TERM GOAL #2   Title Pt will be able to perform 5 time sit to stand in </= 12 seconds with no UE support.    Status On-going      PT LONG TERM GOAL #3   Title Pt will improve FOTO score to >/= 71%    Status On-going      PT LONG TERM GOAL #4   Title Pt will perform right knee active ROM  0 to 120 degrees to improve functional mobility and gait.    Status On-going      PT LONG TERM GOAL #5   Title Pt will be able to amb community surfaces without device with normalize gait pattern.    Status On-going      PT LONG TERM GOAL #6   Title Pt will be able to navigate 1 flight of stairs with single hand rail with step over step.    Status On-going                   Plan - 03/31/21 1208     Clinical Impression Statement Pt arriving in 6-7/10 pain in her right knee. Pt tearfull during parts of session and discouraged that her pain has not subsided since surgery. Pt was edu in healing process following a typical TKA. Pt also with significant edema noted. Pt was instructed in gentle lymphatic massiging in her right thigh and calf as well as scar tissue mobilization. Pt also encouraged to ice and elevate at home with her foot higher  than her heart to help with pain and swelling. Pt tolerating exercises with limitations due to increase pain today. At end of session following vasopneumatic device pt reported feeling much better. AROM measured 6-112 degrees.  Continue skilled PT to maximize pt's function.    Personal Factors and Comorbidities Comorbidity 3+    Comorbidities anemia, arthritis, anxiety, CA, DM, HA, pneumonia, abdominal hysterectomy back surgery , cesarean section, meniscus repair bilateral,  rt mastectomy 2021    Examination-Activity Limitations Reach Overhead;Sleep    Examination-Participation Restrictions Cleaning;Meal Prep;Yard Work;Community Activity;Driving;Shop;Laundry    Stability/Clinical Decision Making Stable/Uncomplicated    Rehab Potential Good    PT Frequency 2x / week    PT Duration 12 weeks    PT Treatment/Interventions ADLs/Self Care Home Management;Cryotherapy;Gait training;Stair training;Functional mobility training;Therapeutic activities;Therapeutic exercise;Balance training;Neuromuscular re-education;Patient/family education;Passive range of motion;Scar mobilization;Manual techniques;Dry needling;Taping;Vasopneumatic Device;Moist Heat    PT Next Visit Plan Continued edema reduction interventions, progressing mobility and functional quad strength as able. vaso    PT Home Exercise Plan Access Code: [email or web address]    Consulted and Agree with Plan of Care Patient             Patient will benefit from skilled therapeutic intervention in order to improve the following deficits and impairments:  Pain, Postural dysfunction, Decreased range of motion, Decreased activity tolerance, Difficulty walking, Decreased mobility, Decreased strength, Increased edema, Decreased balance  Visit Diagnosis: Acute pain of right knee  Stiffness of right knee, not elsewhere classified  Difficulty in walking, not elsewhere classified  Localized edema     Problem List Patient Active Problem List   Diagnosis Date Noted   Status post total right knee replacement 02/22/2021   History of right breast cancer 04/13/2020   Genetic testing 03/10/2020   Family history of breast cancer 02/28/2020   Malignant neoplasm of upper-outer quadrant of right breast in female, estrogen receptor positive ([place]) 02/26/2020   Primary osteoarthritis of right knee 03/20/2019   Primary osteoarthritis of left knee 03/20/2019   Primary osteoarthritis of both knees 04/24/2017   Spondylolisthesis at L3-L4  level 06/01/2015    Oretha Caprice, PT, MPT 03/31/2021, 12:32 PM  [facility] Physical Therapy [address] [place], [place], [address] Phone: [phone]   Fax:  [phone]  Name: Judith Chavez MRN: [id number] Date of Birth: 1945-10-15

## 2021-04-02 ENCOUNTER — Inpatient Hospital Stay: Payer: Medicare Other | Attending: Hematology | Admitting: Hematology

## 2021-04-02 DIAGNOSIS — C50411 Malignant neoplasm of upper-outer quadrant of right female breast: Secondary | ICD-10-CM

## 2021-04-05 ENCOUNTER — Ambulatory Visit (INDEPENDENT_AMBULATORY_CARE_PROVIDER_SITE_OTHER): Payer: Medicare Other | Admitting: Physical Therapy

## 2021-04-05 ENCOUNTER — Other Ambulatory Visit: Payer: Self-pay

## 2021-04-05 ENCOUNTER — Encounter: Payer: Self-pay | Admitting: Physical Therapy

## 2021-04-05 DIAGNOSIS — M25661 Stiffness of right knee, not elsewhere classified: Secondary | ICD-10-CM

## 2021-04-05 DIAGNOSIS — M25561 Pain in right knee: Secondary | ICD-10-CM

## 2021-04-05 DIAGNOSIS — R600 Localized edema: Secondary | ICD-10-CM | POA: Diagnosis not present

## 2021-04-05 DIAGNOSIS — R262 Difficulty in walking, not elsewhere classified: Secondary | ICD-10-CM | POA: Diagnosis not present

## 2021-04-05 NOTE — Therapy (Signed)
[facility] [address] [place], [place], [address] Phone: [phone]   Fax:  [phone]  Physical Therapy Treatment Progress Note  Patient Details  Name: Judith Chavez MRN: [id number] Date of Birth: 1946-02-27 Referring Provider (PT): Frankey Shown MD   Encounter Date: 04/05/2021   PT End of Session - 04/05/21 1154     Visit Number 7    Number of Visits 24    Date for PT Re-Evaluation 06/04/21    Authorization Type PN sent on 04/05/2021 at 7th visit    Progress Note Due on Visit 17    PT Start Time 1148    PT Stop Time 1230    PT Time Calculation (min) 42 min    Activity Tolerance Patient tolerated treatment well    Behavior During Therapy [facility] for tasks assessed/performed             Past Medical History:  Diagnosis Date   Anemia    as a teenager   Anxiety    Arthritis    knees   Cancer ([place]) 03/2020   right breast DCIS, LCIS   Depression    Diabetes mellitus without complication ([place])    Type 2   Family history of breast cancer 02/28/2020   Headache    migraines   History of pneumonia    Numbness    "left foot"    Pneumonia    Poor circulation     Past Surgical History:  Procedure Laterality Date   ABCESS DRAINAGE     "after cesarean- in hospital for 3 months"   [place]  07/11/1968   CHOLECYSTECTOMY     COLONOSCOPY     DILATION AND CURETTAGE OF UTERUS     EYE SURGERY Right    MENISCUS REPAIR Bilateral    SIMPLE MASTECTOMY WITH AXILLARY SENTINEL NODE BIOPSY Right 04/13/2020   Procedure: RIGHT MASTECTOMY WITH RIGHT AXILLARY SENTINEL NODE BIOPSY;  Surgeon: Coralie Keens, MD;  Location: [place];  Service: General;  Laterality: Right;   [place] Right 02/22/2021   Procedure: RIGHT TOTAL KNEE ARTHROPLASTY;  Surgeon: Leandrew Koyanagi, MD;  Location: [place];  Service: Orthopedics;  Laterality: Right;    There  were no vitals filed for this visit.   Subjective Assessment - 04/05/21 1151     Subjective Pt arriving today reporting more soreness with pain reported at 3/10. Pt stating she took 2 tylenol prior to therapy .    Pertinent History Rt breast cancer DCIS, LCIS, ER/PR positive s/p mastectomy with 0/3 nodes removed on 04/13/20 Dr. Ninfa Linden and radiation status unknown.  . Other history includes anxiety, OA, hysterectomy, DM    Diagnostic tests X-ray    Patient Stated Goals Walk without walker, function without pain    Currently in Pain? Yes    Pain Score 3     Pain Location Knee    Pain Orientation Right    Pain Descriptors / Indicators Sore    Pain Onset More than a month ago                [facility] PT Assessment - 04/05/21 0001       Assessment   Medical Diagnosis M17.11 primary OA of right knee, R TKA    Referring Provider (PT) Frankey Shown MD    Onset Date/Surgical Date 02/22/21      AROM  Right/Left Knee Right    Right Knee Extension -5    Right Knee Flexion 115                           [facility] Adult PT Treatment/Exercise - 04/05/21 0001       Neuro Re-ed    Neuro Re-ed Details  SLS: level surface with CGA and intermittent UE support at TM bar (best time on Rt SLS was 6 seconds). tandum stance with each foot forward x 5 attempts each      Knee/Hip Exercises: Aerobic   Nustep L6 x 10 minutes      Knee/Hip Exercises: Machines for Strengthening   Cybex Leg Press 75# double leg: 2x15, Rt LE only: 31# 2x15      Knee/Hip Exercises: Standing   Other Standing Knee Exercises TRX squats x 10      Knee/Hip Exercises: Seated   Long Arc Quad Right   2 x 10, c pause in flexion/extension stretch   Long Arc Quad Weight 5 lbs.    Other Seated Knee/Hip Exercises seated SLR x 10      Knee/Hip Exercises: Supine   Quad Sets Strengthening;Right;10 reps      Modalities   Modalities Vasopneumatic      Vasopneumatic   Number Minutes Vasopneumatic  10 minutes     Vasopnuematic Location  Knee    Vasopneumatic Pressure Medium    Vasopneumatic Temperature  34                       PT Short Term Goals - 04/05/21 1216       PT SHORT TERM GOAL #1   Title Pt will be independent in her initial HEP.    Status Achieved      PT SHORT TERM GOAL #2   Title Pt will be able to perform sit to stand with no UE support from standard chair independently.    Status Achieved               PT Long Term Goals - 04/05/21 1217       PT LONG TERM GOAL #1   Title Pt will be independent in her advanced HEP.    Status On-going      PT LONG TERM GOAL #2   Title Pt will be able to perform 5 time sit to stand in </= 12 seconds with no UE support.    Status On-going      PT LONG TERM GOAL #3   Title Pt will improve FOTO score to >/= 71%    Status On-going      PT LONG TERM GOAL #4   Title Pt will perform right knee active ROM  0 to 120 degrees to improve functional mobility and gait.    Baseline 5-115 on 04/05/2021    Status On-going      PT LONG TERM GOAL #5   Title Pt will be able to amb community surfaces without device with normalize gait pattern.    Baseline Still reporting increased pain with community walking up to 1/2 mile with pain 6/10.    Status On-going      PT LONG TERM GOAL #6   Title Pt will be able to navigate 1 flight of stairs with single hand rail with step over step.    Status On-going  Plan - 04/05/21 1155     Clinical Impression Statement Pt arriving to therapy with 3/10 pain after taking 2 tylenonl prior to theapy. Pt stating she walked 1/2 mile yesterday and the walking felt good. Pt stating that after her walk she began to have sharp pains in her right knee. Pt has been compliant in her HEP and is gradually progressing her walking. Pt also wearing compression stocking on her right LE (thigh high). Pt is making progress with physical therapy with active arc of motion in her right knee 5-115  degrees. Pt still working on overall funcitonal mobility and strengtheing. Continue skilled PT to maximize pt's funtion.    Personal Factors and Comorbidities Comorbidity 3+    Comorbidities anemia, arthritis, anxiety, CA, DM, HA, pneumonia, abdominal hysterectomy back surgery , cesarean section, meniscus repair bilateral, rt mastectomy 2021    Examination-Activity Limitations Reach Overhead;Sleep    Examination-Participation Restrictions Cleaning;Meal Prep;Yard Work;Community Activity;Driving;Shop;Laundry    Stability/Clinical Decision Making Stable/Uncomplicated    Rehab Potential Good    PT Frequency 2x / week    PT Duration 12 weeks    PT Treatment/Interventions ADLs/Self Care Home Management;Cryotherapy;Gait training;Stair training;Functional mobility training;Therapeutic activities;Therapeutic exercise;Balance training;Neuromuscular re-education;Patient/family education;Passive range of motion;Scar mobilization;Manual techniques;Dry needling;Taping;Vasopneumatic Device;Moist Heat    PT Next Visit Plan Continued edema reduction interventions, progressing mobility and functional quad strength as able. vaso as needed    PT Home Exercise Plan Access Code: [email or web address]    Consulted and Agree with Plan of Care Patient             Patient will benefit from skilled therapeutic intervention in order to improve the following deficits and impairments:  Pain, Postural dysfunction, Decreased range of motion, Decreased activity tolerance, Difficulty walking, Decreased mobility, Decreased strength, Increased edema, Decreased balance  Visit Diagnosis: Acute pain of right knee  Stiffness of right knee, not elsewhere classified  Difficulty in walking, not elsewhere classified  Localized edema     Problem List Patient Active Problem List   Diagnosis Date Noted   Status post total right knee replacement 02/22/2021   History of right breast cancer 04/13/2020   Genetic testing 03/10/2020   Family  history of breast cancer 02/28/2020   Malignant neoplasm of upper-outer quadrant of right breast in female, estrogen receptor positive ([place]) 02/26/2020   Primary osteoarthritis of right knee 03/20/2019   Primary osteoarthritis of left knee 03/20/2019   Primary osteoarthritis of both knees 04/24/2017   Spondylolisthesis at L3-L4 level 06/01/2015    Oretha Caprice, PT, MPT 04/05/2021, 12:26 PM  [facility] [address] [place], [place], [address] Phone: [phone]   Fax:  [phone]  Name: Shakaya Bhullar MRN: [id number] Date of Birth: 01-Nov-1945

## 2021-04-07 ENCOUNTER — Ambulatory Visit: Payer: Self-pay

## 2021-04-07 ENCOUNTER — Ambulatory Visit (INDEPENDENT_AMBULATORY_CARE_PROVIDER_SITE_OTHER): Payer: Medicare Other | Admitting: Physical Therapy

## 2021-04-07 ENCOUNTER — Encounter: Payer: Self-pay | Admitting: Physical Therapy

## 2021-04-07 ENCOUNTER — Encounter: Payer: Self-pay | Admitting: Orthopaedic Surgery

## 2021-04-07 ENCOUNTER — Other Ambulatory Visit: Payer: Self-pay

## 2021-04-07 ENCOUNTER — Ambulatory Visit (INDEPENDENT_AMBULATORY_CARE_PROVIDER_SITE_OTHER): Payer: Medicare Other | Admitting: Orthopaedic Surgery

## 2021-04-07 DIAGNOSIS — M25561 Pain in right knee: Secondary | ICD-10-CM

## 2021-04-07 DIAGNOSIS — R262 Difficulty in walking, not elsewhere classified: Secondary | ICD-10-CM | POA: Diagnosis not present

## 2021-04-07 DIAGNOSIS — M25661 Stiffness of right knee, not elsewhere classified: Secondary | ICD-10-CM

## 2021-04-07 DIAGNOSIS — Z96651 Presence of right artificial knee joint: Secondary | ICD-10-CM | POA: Diagnosis not present

## 2021-04-07 DIAGNOSIS — R600 Localized edema: Secondary | ICD-10-CM | POA: Diagnosis not present

## 2021-04-07 MED ORDER — GABAPENTIN 300 MG PO CAPS: 300.0000 mg | ORAL_CAPSULE | Freq: Three times a day (TID) | ORAL | 3 refills | Status: DC

## 2021-04-07 MED ORDER — CELECOXIB 200 MG PO CAPS: 200.0000 mg | ORAL_CAPSULE | Freq: Two times a day (BID) | ORAL | 3 refills | Status: DC

## 2021-04-07 MED ORDER — TRAMADOL HCL 50 MG PO TABS: 50.0000 mg | ORAL_TABLET | Freq: Two times a day (BID) | ORAL | 0 refills | Status: DC | PRN

## 2021-04-07 NOTE — Therapy (Signed)
[facility] [address] [place], [place], [address] Phone: [phone]   Fax:  [phone]  Physical Therapy Treatment  Patient Details  Name: Judith Chavez MRN: [id number] Date of Birth: 1946-04-28 Referring Provider (PT): Frankey Shown MD   Encounter Date: 04/07/2021   PT End of Session - 04/07/21 1151     Visit Number 8    Number of Visits 24    Date for PT Re-Evaluation 06/04/21    Authorization Type PN sent on 04/05/2021 at 7th visit    Progress Note Due on Visit 17    PT Start Time 1104    PT Stop Time 1154    PT Time Calculation (min) 50 min    Behavior During Therapy [facility] for tasks assessed/performed             Past Medical History:  Diagnosis Date   Anemia    as a teenager   Anxiety    Arthritis    knees   Cancer ([place]) 03/2020   right breast DCIS, LCIS   Depression    Diabetes mellitus without complication ([place])    Type 2   Family history of breast cancer 02/28/2020   Headache    migraines   History of pneumonia    Numbness    "left foot"    Pneumonia    Poor circulation     Past Surgical History:  Procedure Laterality Date   ABCESS DRAINAGE     "after cesarean- in hospital for 3 months"   [place]  07/11/1968   CHOLECYSTECTOMY     COLONOSCOPY     DILATION AND CURETTAGE OF UTERUS     EYE SURGERY Right    MENISCUS REPAIR Bilateral    SIMPLE MASTECTOMY WITH AXILLARY SENTINEL NODE BIOPSY Right 04/13/2020   Procedure: RIGHT MASTECTOMY WITH RIGHT AXILLARY SENTINEL NODE BIOPSY;  Surgeon: Coralie Keens, MD;  Location: [place];  Service: General;  Laterality: Right;   [place] Right 02/22/2021   Procedure: RIGHT TOTAL KNEE ARTHROPLASTY;  Surgeon: Leandrew Koyanagi, MD;  Location: [place];  Service: Orthopedics;  Laterality: Right;    There were no vitals filed for this visit.   Subjective Assessment - 04/07/21  1147     Subjective Pt reporting a bad day yesterday due to over doing it on Monday. Pt stating she felt so good after our appointment on Monday morning that she went home and did several loads of laundry and went shopping. Pt stating her pain Monday night was 8-9/10. Today pt reporting have a much better day and only 5/10 pain reported in right knee.    Pertinent History Rt breast cancer DCIS, LCIS, ER/PR positive s/p mastectomy with 0/3 nodes removed on 04/13/20 Dr. Ninfa Linden and radiation status unknown.  . Other history includes anxiety, OA, hysterectomy, DM    Diagnostic tests X-ray    Patient Stated Goals Walk without walker, function without pain    Currently in Pain? Yes    Pain Score 5     Pain Location Knee    Pain Orientation Right    Pain Descriptors / Indicators Aching;Sore    Pain Type Surgical pain    Pain Onset More than a month ago                [facility] PT Assessment - 04/07/21 0001  Assessment   Medical Diagnosis M17.11 primary OA of right knee, R TKA    Referring Provider (PT) Frankey Shown MD    Onset Date/Surgical Date 02/22/21      AROM   Right/Left Knee Right    Right Knee Extension -5    Right Knee Flexion 115                           [facility] Adult PT Treatment/Exercise - 04/07/21 0001       Neuro Re-ed    Neuro Re-ed Details  side stepping toward 3 cones, cone tapping with each LE x 10, tandum stance 30 second holds x 2 each LE.      Knee/Hip Exercises: Stretches   Gastroc Stretch Both;30 seconds;3 reps      Knee/Hip Exercises: Aerobic   Recumbent Bike L2 x 5 minutes   seat at 5, full revolutions     Knee/Hip Exercises: Machines for Strengthening   Cybex Leg Press 81# double leg: 2x15, Rt LE only: 37# 2x15      Knee/Hip Exercises: Standing   Other Standing Knee Exercises TRX squats x 10      Knee/Hip Exercises: Seated   Long Arc Quad Right   2 x 10, c pause in flexion/extension stretch   Long Arc Quad Weight 5 lbs.     Other Seated Knee/Hip Exercises seated SLR x 10      Modalities   Modalities Vasopneumatic      Vasopneumatic   Number Minutes Vasopneumatic  10 minutes    Vasopnuematic Location  Knee    Vasopneumatic Pressure Medium    Vasopneumatic Temperature  34                     PT Education - 04/07/21 1150     Education Details pt instructed in calf stretches at home using standing lunge position with towel roll under her foot. Pt also instructed in standing balance exercises to perform at her kitchen sink.    Person(s) Educated Patient    Methods Explanation;Demonstration;Verbal cues    Comprehension Returned demonstration;Verbalized understanding              PT Short Term Goals - 04/05/21 1216       PT SHORT TERM GOAL #1   Title Pt will be independent in her initial HEP.    Status Achieved      PT SHORT TERM GOAL #2   Title Pt will be able to perform sit to stand with no UE support from standard chair independently.    Status Achieved               PT Long Term Goals - 04/05/21 1217       PT LONG TERM GOAL #1   Title Pt will be independent in her advanced HEP.    Status On-going      PT LONG TERM GOAL #2   Title Pt will be able to perform 5 time sit to stand in </= 12 seconds with no UE support.    Status On-going      PT LONG TERM GOAL #3   Title Pt will improve FOTO score to >/= 71%    Status On-going      PT LONG TERM GOAL #4   Title Pt will perform right knee active ROM  0 to 120 degrees to improve functional mobility and gait.    Baseline 5-115 on  04/05/2021    Status On-going      PT LONG TERM GOAL #5   Title Pt will be able to amb community surfaces without device with normalize gait pattern.    Baseline Still reporting increased pain with community walking up to 1/2 mile with pain 6/10.    Status On-going      PT LONG TERM GOAL #6   Title Pt will be able to navigate 1 flight of stairs with single hand rail with step over step.     Status On-going                   Plan - 04/07/21 1159     Clinical Impression Statement Pt arriving today stating she had a bad day yesterdday with increased pain which she believes was caused by her increased activity on Monday afternoon. Pt arriving today reporting 5/10 pain. Pt tolerating all exercises well. Pt's arc of motion 5-115 degrees. Pt progressing with more dynamic balance exercises and her HEP was updated. Continue skilled PT to maximize function.    Personal Factors and Comorbidities Comorbidity 3+    Comorbidities anemia, arthritis, anxiety, CA, DM, HA, pneumonia, abdominal hysterectomy back surgery , cesarean section, meniscus repair bilateral, rt mastectomy 2021    Examination-Activity Limitations Reach Overhead;Sleep    Examination-Participation Restrictions Cleaning;Meal Prep;Yard Work;Community Activity;Driving;Shop;Laundry    Stability/Clinical Decision Making Stable/Uncomplicated    Rehab Potential Good    PT Frequency 2x / week    PT Duration 12 weeks    PT Treatment/Interventions ADLs/Self Care Home Management;Cryotherapy;Gait training;Stair training;Functional mobility training;Therapeutic activities;Therapeutic exercise;Balance training;Neuromuscular re-education;Patient/family education;Passive range of motion;Scar mobilization;Manual techniques;Dry needling;Taping;Vasopneumatic Device;Moist Heat    PT Next Visit Plan Continued edema reduction interventions, progressing mobility and functional quad strength as able. vaso as needed    PT Home Exercise Plan Access Code: [email or web address]    Consulted and Agree with Plan of Care Patient             Patient will benefit from skilled therapeutic intervention in order to improve the following deficits and impairments:  Pain, Postural dysfunction, Decreased range of motion, Decreased activity tolerance, Difficulty walking, Decreased mobility, Decreased strength, Increased edema, Decreased balance  Visit  Diagnosis: Acute pain of right knee  Stiffness of right knee, not elsewhere classified  Difficulty in walking, not elsewhere classified  Localized edema     Problem List Patient Active Problem List   Diagnosis Date Noted   Status post total right knee replacement 02/22/2021   History of right breast cancer 04/13/2020   Genetic testing 03/10/2020   Family history of breast cancer 02/28/2020   Malignant neoplasm of upper-outer quadrant of right breast in female, estrogen receptor positive ([place]) 02/26/2020   Primary osteoarthritis of right knee 03/20/2019   Primary osteoarthritis of left knee 03/20/2019   Primary osteoarthritis of both knees 04/24/2017   Spondylolisthesis at L3-L4 level 06/01/2015    Oretha Caprice, PT, MPT 04/07/2021, 12:09 PM  [facility] [address] [place], [place], [address] Phone: [phone]   Fax:  [phone]  Name: Kiyah Demartini MRN: [id number] Date of Birth: 1946/06/19

## 2021-04-07 NOTE — Patient Instructions (Signed)
Access Code: [email or web address] URL: [email or web address] Date: 04/07/2021 Prepared by: Kearney Hard  Program Notes Sit on the edge of a chair or bed with legs hanging down.  Swing legs back and forth for 2-3 minutes.    Exercises Long Sitting Quad Set with Towel Roll Under Heel - 3 x daily - 7 x weekly - 2 sets - 10 reps - 5 seconds hold Supine Active Straight Leg Raise - 3 x daily - 7 x weekly - 2 sets - 10 reps Sit to Stand with Counter Support - 3 x daily - 7 x weekly - 2 sets - 10 reps Seated Heel Slide - 3 x daily - 7 x weekly - 2 sets - 10 reps - 5 seconds hold Seated Long Arc Quad - 3 x daily - 7 x weekly - 2 sets - 10 reps - 5 seconds hold Supine Heel Slide with Strap - 2-3 x daily - 7 x weekly - 2 sets - 10 reps - 5 seconds hold Seated Knee Flexion AAROM - 2-3 x daily - 7 x weekly - 1-2 sets - 10 reps - 10 sec hold Recumbent Bike Step Up - 1-2 x daily - 7 x weekly - 10 reps Standing Knee Flexion Stretch on Step - 2 x daily - 7 x weekly - 2 sets - 10 reps Gastroc Stretch on Book - 2 x daily - 7 x weekly - 3 reps - 30 seconds hold Standing Tandem Balance with Counter Support - 1 x daily - 3 reps - up to 30 second holds hold

## 2021-04-07 NOTE — Progress Notes (Signed)
Post-Op Visit Note   Patient: Judith Chavez           Date of Birth: 05/31/1946           MRN: [id number] Visit Date: 04/07/2021 PCP: Mayra Neer, MD   Assessment & Plan:  Chief Complaint:  Chief Complaint  Patient presents with   Right Knee - Pain   Visit Diagnoses:  1. Hx of total knee replacement, right     Plan: Judith Chavez is 6 weeks status post right total knee replacement on 02/22/2021.  She states that she has increased pain at night time and unable to find a comfortable position.  She has lost a lot of sleep due to discomfort.  The opiate pain medications are too strong.Marland Kitchen  Physical therapy is going well overall.  Currently taking gabapentin and Tylenol and using ice for the pain.  She is progressing well with her range of motion.  Right knee shows a fully healed surgical scar.  No evidence of infection.  Moderate swelling.  Range of motion is 3 to 115 degrees.  Stable to varus.  No significant pain with passive range of motion.  X-rays demonstrate stable implant without any complications.  Judith Chavez is doing quite well with her rehab and recovery.  I sent in prescription for tramadol and Celebrex to help with the pain at night as well as refill the gabapentin.  She will continue to use ice man.  She will continue with physical therapy.  Recheck in 6 weeks.  Dental prophylaxis reinforced.  Handicap placard provided.  Follow-Up Instructions: Return in about 6 weeks (around 05/19/2021).   Orders:  Orders Placed This Encounter  Procedures   XR Knee 1-2 Views Right   Meds ordered this encounter  Medications   celecoxib (CELEBREX) 200 MG capsule    Sig: Take 1 capsule (200 mg total) by mouth 2 (two) times daily.    Dispense:  30 capsule    Refill:  3   traMADol (ULTRAM) 50 MG tablet    Sig: Take 1-2 tablets (50-100 mg total) by mouth every 12 (twelve) hours as needed.    Dispense:  40 tablet    Refill:  0   gabapentin (NEURONTIN) 300 MG capsule    Sig: Take 1 capsule  (300 mg total) by mouth 3 (three) times daily.    Dispense:  30 capsule    Refill:  3    Imaging: XR Knee 1-2 Views Right  Result Date: 04/07/2021 Stable total knee replacement in good alignment    PMFS History: Patient Active Problem List   Diagnosis Date Noted   Status post total right knee replacement 02/22/2021   History of right breast cancer 04/13/2020   Genetic testing 03/10/2020   Family history of breast cancer 02/28/2020   Malignant neoplasm of upper-outer quadrant of right breast in female, estrogen receptor positive ([place]) 02/26/2020   Primary osteoarthritis of right knee 03/20/2019   Primary osteoarthritis of left knee 03/20/2019   Primary osteoarthritis of both knees 04/24/2017   Spondylolisthesis at L3-L4 level 06/01/2015   Past Medical History:  Diagnosis Date   Anemia    as a teenager   Anxiety    Arthritis    knees   Cancer ([place]) 03/2020   right breast DCIS, LCIS   Depression    Diabetes mellitus without complication ([place])    Type 2   Family history of breast cancer 02/28/2020   Headache    migraines   History of pneumonia  Numbness    "left foot"    Pneumonia    Poor circulation     Family History  Problem Relation Age of Onset   Breast cancer Cousin        maternal; dx and d. <50   Breast cancer Cousin        maternal; dx and d. <50   Breast cancer Cousin        maternal; dx and d. <50   Breast cancer Cousin        maternal; dx and d. <50   Cancer Paternal Aunt        unknown type cancer dx 27s    Past Surgical History:  Procedure Laterality Date   ABCESS DRAINAGE     "after cesarean- in hospital for 3 months"   ABDOMINAL HYSTERECTOMY     BACK SURGERY     CESAREAN SECTION  07/11/1968   CHOLECYSTECTOMY     COLONOSCOPY     DILATION AND CURETTAGE OF UTERUS     EYE SURGERY Right    MENISCUS REPAIR Bilateral    SIMPLE MASTECTOMY WITH AXILLARY SENTINEL NODE BIOPSY Right 04/13/2020   Procedure: RIGHT MASTECTOMY WITH RIGHT AXILLARY  SENTINEL NODE BIOPSY;  Surgeon: Coralie Keens, MD;  Location: [place];  Service: General;  Laterality: Right;   [place] Right 02/22/2021   Procedure: RIGHT TOTAL KNEE ARTHROPLASTY;  Surgeon: Leandrew Koyanagi, MD;  Location: [place];  Service: Orthopedics;  Laterality: Right;   Social History   Occupational History   Occupation: retired   Tobacco Use   Smoking status: Former    Packs/day: 0.50    Years: 10.00    Pack years: 5.00    Types: Cigarettes    Quit date: 07/11/1986    Years since quitting: 34.7   Smokeless tobacco: Never  Vaping Use   Vaping Use: Never used  Substance and Sexual Activity   Alcohol use: Yes    Comment: occassionally   Drug use: No   Sexual activity: Not on file

## 2021-04-12 ENCOUNTER — Ambulatory Visit (INDEPENDENT_AMBULATORY_CARE_PROVIDER_SITE_OTHER): Payer: Medicare Other | Admitting: Physical Therapy

## 2021-04-12 ENCOUNTER — Other Ambulatory Visit: Payer: Self-pay

## 2021-04-12 DIAGNOSIS — R262 Difficulty in walking, not elsewhere classified: Secondary | ICD-10-CM

## 2021-04-12 DIAGNOSIS — M25661 Stiffness of right knee, not elsewhere classified: Secondary | ICD-10-CM | POA: Diagnosis not present

## 2021-04-12 DIAGNOSIS — M25561 Pain in right knee: Secondary | ICD-10-CM | POA: Diagnosis not present

## 2021-04-12 DIAGNOSIS — R600 Localized edema: Secondary | ICD-10-CM | POA: Diagnosis not present

## 2021-04-12 NOTE — Therapy (Signed)
[facility] [address] [place], [place], [address] Phone: [phone]   Fax:  [phone]  Physical Therapy Treatment  Patient Details  Name: Judith Chavez MRN: [id number] Date of Birth: Dec 27, 1945 Referring Provider (PT): Frankey Shown MD   Encounter Date: 04/12/2021   PT End of Session - 04/12/21 1121     Visit Number 9    Number of Visits 24    Date for PT Re-Evaluation 06/04/21    Authorization Type PN sent on 04/05/2021 at 7th visit    Progress Note Due on Visit 17    PT Start Time 1100    PT Stop Time 1140    PT Time Calculation (min) 40 min    Activity Tolerance Patient tolerated treatment well    Behavior During Therapy [facility] for tasks assessed/performed             Past Medical History:  Diagnosis Date   Anemia    as a teenager   Anxiety    Arthritis    knees   Cancer ([place]) 03/2020   right breast DCIS, LCIS   Depression    Diabetes mellitus without complication ([place])    Type 2   Family history of breast cancer 02/28/2020   Headache    migraines   History of pneumonia    Numbness    "left foot"    Pneumonia    Poor circulation     Past Surgical History:  Procedure Laterality Date   ABCESS DRAINAGE     "after cesarean- in hospital for 3 months"   [place]  07/11/1968   CHOLECYSTECTOMY     COLONOSCOPY     DILATION AND CURETTAGE OF UTERUS     EYE SURGERY Right    MENISCUS REPAIR Bilateral    SIMPLE MASTECTOMY WITH AXILLARY SENTINEL NODE BIOPSY Right 04/13/2020   Procedure: RIGHT MASTECTOMY WITH RIGHT AXILLARY SENTINEL NODE BIOPSY;  Surgeon: Coralie Keens, MD;  Location: [place];  Service: General;  Laterality: Right;   [place] Right 02/22/2021   Procedure: RIGHT TOTAL KNEE ARTHROPLASTY;  Surgeon: Leandrew Koyanagi, MD;  Location: [place];  Service: Orthopedics;  Laterality: Right;    There were no vitals  filed for this visit.   Subjective Assessment - 04/12/21 1119     Subjective Pt arriving today reporing more anterior knee pain of 3-4/10 pain.    Pertinent History Rt breast cancer DCIS, LCIS, ER/PR positive s/p mastectomy with 0/3 nodes removed on 04/13/20 Dr. Ninfa Linden and radiation status unknown.  . Other history includes anxiety, OA, hysterectomy, DM    Diagnostic tests X-ray    Patient Stated Goals Walk without walker, function without pain    Currently in Pain? Yes    Pain Score 4     Pain Location Knee    Pain Orientation Right    Pain Descriptors / Indicators Aching;Sore;Tightness    Pain Type Surgical pain    Pain Onset More than a month ago                [facility] PT Assessment - 04/12/21 0001       Assessment   Medical Diagnosis M17.11 primary OA of right knee, R TKA    Referring Provider (PT) Frankey Shown MD    Onset Date/Surgical Date 02/22/21      AROM   Right/Left Knee Right  Right Knee Extension -5    Right Knee Flexion 115                           [facility] Adult PT Treatment/Exercise - 04/12/21 0001       Neuro Re-ed    Neuro Re-ed Details  stairs with single rail support, cone tapping x 10 with no UE support      Knee/Hip Exercises: Aerobic   Recumbent Bike bike/UBE: L3 x 8 minutes      Knee/Hip Exercises: Machines for Strengthening   Cybex Leg Press 81# double leg: 2x15, Rt LE only: 37# 2x15      Knee/Hip Exercises: Standing   Other Standing Knee Exercises up and down clinic stairs 1 flight with single UE support, step to gait pattern going down and step through gait pattern ascending. All with SBA      Knee/Hip Exercises: Seated   Long Arc Quad Right   2 x 10, c pause in flexion/extension stretch   Long Arc Quad Weight 5 lbs.      Modalities   Modalities Vasopneumatic      Vasopneumatic   Number Minutes Vasopneumatic  10 minutes    Vasopnuematic Location  Knee    Vasopneumatic Pressure Medium    Vasopneumatic Temperature   34                       PT Short Term Goals - 04/12/21 1128       PT SHORT TERM GOAL #1   Title Pt will be independent in her initial HEP.    Status Achieved      PT SHORT TERM GOAL #2   Title Pt will be able to perform sit to stand with no UE support from standard chair independently.    Status Achieved               PT Long Term Goals - 04/12/21 1128       PT LONG TERM GOAL #1   Title Pt will be independent in her advanced HEP.    Status On-going      PT LONG TERM GOAL #2   Title Pt will be able to perform 5 time sit to stand in </= 12 seconds with no UE support.    Baseline 12 seconds with no UE support    Status Achieved      PT LONG TERM GOAL #3   Title Pt will improve FOTO score to >/= 71%    Status On-going      PT LONG TERM GOAL #4   Title Pt will perform right knee active ROM  0 to 120 degrees to improve functional mobility and gait.    Status On-going      PT LONG TERM GOAL #5   Title Pt will be able to amb community surfaces without device with normalize gait pattern.    Status On-going      PT LONG TERM GOAL #6   Title Pt will be able to navigate 1 flight of stairs with single hand rail with step over step.    Status On-going                   Plan - 04/12/21 1122     Clinical Impression Statement Pt arriving today reporting pain of 3-/410 in right knee. Pt reporting more anterior knee pain and reports difficulty with standing prolonged like when preparing  a meal. Pt's arc of motion actively is 5-115 degrees. Pt still reporting feeling weakness in her right leg with ADL's. Stair navigation using single hand rail with caution and slower navigation. Pt using step to gait pattern with descending and step through pattern ascending.  Continue skilled PT as pt tolerates.    Personal Factors and Comorbidities Comorbidity 3+    Comorbidities anemia, arthritis, anxiety, CA, DM, HA, pneumonia, abdominal hysterectomy back surgery ,  cesarean section, meniscus repair bilateral, rt mastectomy 2021    Examination-Activity Limitations Reach Overhead;Sleep    Stability/Clinical Decision Making Stable/Uncomplicated    Rehab Potential Good    PT Frequency 2x / week    PT Duration 12 weeks    PT Treatment/Interventions ADLs/Self Care Home Management;Cryotherapy;Gait training;Stair training;Functional mobility training;Therapeutic activities;Therapeutic exercise;Balance training;Neuromuscular re-education;Patient/family education;Passive range of motion;Scar mobilization;Manual techniques;Dry needling;Taping;Vasopneumatic Device;Moist Heat    PT Next Visit Plan Continued edema reduction interventions, progressing mobility and functional quad strength as able. vaso as needed    PT Home Exercise Plan Access Code: [email or web address]    Consulted and Agree with Plan of Care Patient             Patient will benefit from skilled therapeutic intervention in order to improve the following deficits and impairments:  Pain, Postural dysfunction, Decreased range of motion, Decreased activity tolerance, Difficulty walking, Decreased mobility, Decreased strength, Increased edema, Decreased balance  Visit Diagnosis: Acute pain of right knee  Stiffness of right knee, not elsewhere classified  Difficulty in walking, not elsewhere classified  Localized edema     Problem List Patient Active Problem List   Diagnosis Date Noted   Status post total right knee replacement 02/22/2021   History of right breast cancer 04/13/2020   Genetic testing 03/10/2020   Family history of breast cancer 02/28/2020   Malignant neoplasm of upper-outer quadrant of right breast in female, estrogen receptor positive ([place]) 02/26/2020   Primary osteoarthritis of right knee 03/20/2019   Primary osteoarthritis of left knee 03/20/2019   Primary osteoarthritis of both knees 04/24/2017   Spondylolisthesis at L3-L4 level 06/01/2015    Oretha Caprice, PT,  MPT 04/12/2021, 11:42 AM  [facility] [address] [place], [place], [address] Phone: [phone]   Fax:  [phone]  Name: Judith Chavez MRN: [id number] Date of Birth: [birth year]

## 2021-04-14 ENCOUNTER — Encounter: Payer: Self-pay | Admitting: Physical Therapy

## 2021-04-14 ENCOUNTER — Ambulatory Visit (INDEPENDENT_AMBULATORY_CARE_PROVIDER_SITE_OTHER): Payer: Medicare Other | Admitting: Physical Therapy

## 2021-04-14 ENCOUNTER — Other Ambulatory Visit: Payer: Self-pay

## 2021-04-14 DIAGNOSIS — M25561 Pain in right knee: Secondary | ICD-10-CM

## 2021-04-14 DIAGNOSIS — R262 Difficulty in walking, not elsewhere classified: Secondary | ICD-10-CM | POA: Diagnosis not present

## 2021-04-14 DIAGNOSIS — R600 Localized edema: Secondary | ICD-10-CM | POA: Diagnosis not present

## 2021-04-14 DIAGNOSIS — M25661 Stiffness of right knee, not elsewhere classified: Secondary | ICD-10-CM | POA: Diagnosis not present

## 2021-04-14 NOTE — Therapy (Signed)
[facility] [address] [place], [place], [address] Phone: [phone]   Fax:  [phone]  Physical Therapy Treatment  Patient Details  Name: Judith Chavez MRN: [id number] Date of Birth: Apr 04, 1946 Referring Provider (PT): Frankey Shown MD   Encounter Date: 04/14/2021   PT End of Session - 04/14/21 1110     Visit Number 10    Number of Visits 24    Date for PT Re-Evaluation 06/04/21    Authorization Type PN sent on 04/05/2021 at 7th visit    Progress Note Due on Visit 17    PT Start Time 1100    PT Stop Time 1145    PT Time Calculation (min) 45 min    Activity Tolerance Patient tolerated treatment well    Behavior During Therapy [facility] for tasks assessed/performed             Past Medical History:  Diagnosis Date   Anemia    as a teenager   Anxiety    Arthritis    knees   Cancer ([place]) 03/2020   right breast DCIS, LCIS   Depression    Diabetes mellitus without complication ([place])    Type 2   Family history of breast cancer 02/28/2020   Headache    migraines   History of pneumonia    Numbness    "left foot"    Pneumonia    Poor circulation     Past Surgical History:  Procedure Laterality Date   ABCESS DRAINAGE     "after cesarean- in hospital for 3 months"   [place]  07/11/1968   CHOLECYSTECTOMY     COLONOSCOPY     DILATION AND CURETTAGE OF UTERUS     EYE SURGERY Right    MENISCUS REPAIR Bilateral    SIMPLE MASTECTOMY WITH AXILLARY SENTINEL NODE BIOPSY Right 04/13/2020   Procedure: RIGHT MASTECTOMY WITH RIGHT AXILLARY SENTINEL NODE BIOPSY;  Surgeon: Coralie Keens, MD;  Location: [place];  Service: General;  Laterality: Right;   [place] Right 02/22/2021   Procedure: RIGHT TOTAL KNEE ARTHROPLASTY;  Surgeon: Leandrew Koyanagi, MD;  Location: [place];  Service: Orthopedics;  Laterality: Right;    There were no vitals  filed for this visit.   Subjective Assessment - 04/14/21 1103     Subjective Pt arriving today with 3/10 pain in right knee. Pt reporting she has been working on walking up and down the stairs and her ankle is a little swollen. Pt also adding another block to her daily walk.    Pertinent History Rt breast cancer DCIS, LCIS, ER/PR positive s/p mastectomy with 0/3 nodes removed on 04/13/20 Dr. Ninfa Linden and radiation status unknown.  . Other history includes anxiety, OA, hysterectomy, DM    Diagnostic tests X-ray    Patient Stated Goals Walk without walker, function without pain    Currently in Pain? Yes    Pain Score 3     Pain Location Knee    Pain Orientation Right    Pain Descriptors / Indicators Aching;Sore;Tightness    Pain Type Surgical pain    Pain Onset More than a month ago    Pain Frequency Constant                [facility] PT Assessment - 04/14/21 0001       Assessment   Medical Diagnosis M17.11 primary OA  of right knee, R TKA    Referring Provider (PT) Frankey Shown MD    Onset Date/Surgical Date 02/22/21      AROM   Right/Left Knee Right    Right Knee Extension -4    Right Knee Flexion 115      PROM   Right/Left Knee Right    Right Knee Extension -2    Right Knee Flexion 118                           [facility] Adult PT Treatment/Exercise - 04/14/21 0001       Therapeutic Activites    Therapeutic Activities Other Therapeutic Activities    Other Therapeutic Activities up and down curb step in clinic and outside of clinic x 10, ramp in clinic x 3 and outside of clinic x 2, up and down clinic stairs 2.5 flights x 2 with single hand rail   all with supervision     Neuro Re-ed    Neuro Re-ed Details  vectors with disc reaching in diagnol planes, cone tapping x 20 with close supervision      Knee/Hip Exercises: Aerobic   Recumbent Bike bike/UBE: L3 x 10 minutes      Knee/Hip Exercises: Machines for Strengthening   Cybex Leg Press right LE only: 75#  2x10, double leg 100# 2x10      Knee/Hip Exercises: Standing   Forward Step Up Both;15 reps;Hand Hold: 0;Step Height: 6"      Knee/Hip Exercises: Supine   Quad Sets AROM;Strengthening;Right;10 reps;Limitations    Heel Slides AAROM;Right;5 reps;Limitations    Heel Slides Limitations using strap    Straight Leg Raises Strengthening;Right;15 reps                       PT Short Term Goals - 04/12/21 1128       PT SHORT TERM GOAL #1   Title Pt will be independent in her initial HEP.    Status Achieved      PT SHORT TERM GOAL #2   Title Pt will be able to perform sit to stand with no UE support from standard chair independently.    Status Achieved               PT Long Term Goals - 04/14/21 1152       PT LONG TERM GOAL #1   Title Pt will be independent in her advanced HEP.    Status On-going      PT LONG TERM GOAL #2   Title Pt will be able to perform 5 time sit to stand in </= 12 seconds with no UE support.    Baseline 12 seconds with no UE support    Status Achieved      PT LONG TERM GOAL #3   Title Pt will improve FOTO score to >/= 71%    Status On-going      PT LONG TERM GOAL #4   Title Pt will perform right knee active ROM  0 to 120 degrees to improve functional mobility and gait.    Baseline 4-115 on 04/14/2021    Status On-going      PT LONG TERM GOAL #5   Title Pt will be able to amb community surfaces without device with normalize gait pattern.    Baseline Still reporting increased pain with community walking up to 1/2 mile with pain 6/10.    Status On-going  PT LONG TERM GOAL #6   Title Pt will be able to navigate 1 flight of stairs with single hand rail with step over step.    Status Achieved                   Plan - 04/14/21 1148     Clinical Impression Statement Pt arriving today reporting 3/10 pain in her right knee. Pt stating she has increased her walking program and distance by 1 block and is begining to practice  walking up and down her stairs with hand rails. Today our treatment focused on quad strengthening and dynamic balance. Pt able to safely navigate > 2 flights of stairs with single hand rail, a ramp and curb without difficulty. Still progressing toward amb up and down stairs with no hand rail. Pt's AROM arc 4-115 degrees. Conitnue skilled PT to maximize pt's function.    Personal Factors and Comorbidities Comorbidity 3+    Comorbidities anemia, arthritis, anxiety, CA, DM, HA, pneumonia, abdominal hysterectomy back surgery , cesarean section, meniscus repair bilateral, rt mastectomy 2021    Examination-Participation Restrictions Cleaning;Meal Prep;Yard Work;Community Activity;Driving;Shop;Laundry    Stability/Clinical Decision Making Stable/Uncomplicated    Rehab Potential Good    PT Frequency 2x / week    PT Duration 12 weeks    PT Treatment/Interventions ADLs/Self Care Home Management;Cryotherapy;Gait training;Stair training;Functional mobility training;Therapeutic activities;Therapeutic exercise;Balance training;Neuromuscular re-education;Patient/family education;Passive range of motion;Scar mobilization;Manual techniques;Dry needling;Taping;Vasopneumatic Device;Moist Heat    PT Next Visit Plan progressing mobility and functional quad strength, stairs with no rail, dynamic balance, functional squats    PT Home Exercise Plan Access Code: [email or web address]    Consulted and Agree with Plan of Care Patient             Patient will benefit from skilled therapeutic intervention in order to improve the following deficits and impairments:  Pain, Postural dysfunction, Decreased range of motion, Decreased activity tolerance, Difficulty walking, Decreased mobility, Decreased strength, Increased edema, Decreased balance  Visit Diagnosis: Acute pain of right knee  Stiffness of right knee, not elsewhere classified  Difficulty in walking, not elsewhere classified  Localized edema     Problem List Patient  Active Problem List   Diagnosis Date Noted   Status post total right knee replacement 02/22/2021   History of right breast cancer 04/13/2020   Genetic testing 03/10/2020   Family history of breast cancer 02/28/2020   Malignant neoplasm of upper-outer quadrant of right breast in female, estrogen receptor positive ([place]) 02/26/2020   Primary osteoarthritis of right knee 03/20/2019   Primary osteoarthritis of left knee 03/20/2019   Primary osteoarthritis of both knees 04/24/2017   Spondylolisthesis at L3-L4 level 06/01/2015    Oretha Caprice, PT, MPT 04/14/2021, 11:54 AM  [facility] [address] [place], [place], [address] Phone: [phone]   Fax:  [phone]  Name: Judith Chavez MRN: [id number] Date of Birth: 01-Jan-1946

## 2021-04-21 ENCOUNTER — Telehealth: Payer: Self-pay | Admitting: Hematology

## 2021-04-21 NOTE — Telephone Encounter (Signed)
Rescheduled per MD sch change, pt has been called and confirmed appt 

## 2021-04-27 ENCOUNTER — Other Ambulatory Visit: Payer: Self-pay

## 2021-04-27 ENCOUNTER — Encounter: Payer: Self-pay | Admitting: Physical Therapy

## 2021-04-27 ENCOUNTER — Ambulatory Visit (INDEPENDENT_AMBULATORY_CARE_PROVIDER_SITE_OTHER): Payer: Medicare Other | Admitting: Physical Therapy

## 2021-04-27 DIAGNOSIS — R262 Difficulty in walking, not elsewhere classified: Secondary | ICD-10-CM | POA: Diagnosis not present

## 2021-04-27 DIAGNOSIS — R600 Localized edema: Secondary | ICD-10-CM

## 2021-04-27 DIAGNOSIS — M25561 Pain in right knee: Secondary | ICD-10-CM | POA: Diagnosis not present

## 2021-04-27 DIAGNOSIS — M25661 Stiffness of right knee, not elsewhere classified: Secondary | ICD-10-CM

## 2021-04-27 NOTE — Therapy (Addendum)
[facility] [address] [place], [place], [address] Phone: [phone]   Fax:  [phone]  Physical Therapy Treatment  Patient Details  Name: Judith Chavez MRN: [id number] Date of Birth: 1946-06-30 Referring Provider (PT): Frankey Shown MD   Encounter Date: 04/27/2021   PT End of Session - 04/27/21 1304     Visit Number 11    Number of Visits 24    Authorization Type PN sent on 04/05/2021 at 7th visit    Progress Note Due on Visit 17    PT Start Time 1300    PT Stop Time 1340    PT Time Calculation (min) 40 min    Activity Tolerance Patient tolerated treatment well    Behavior During Therapy [facility] for tasks assessed/performed             Past Medical History:  Diagnosis Date   Anemia    as a teenager   Anxiety    Arthritis    knees   Cancer ([place]) 03/2020   right breast DCIS, LCIS   Depression    Diabetes mellitus without complication ([place])    Type 2   Family history of breast cancer 02/28/2020   Headache    migraines   History of pneumonia    Numbness    "left foot"    Pneumonia    Poor circulation     Past Surgical History:  Procedure Laterality Date   ABCESS DRAINAGE     "after cesarean- in hospital for 3 months"   [place]  07/11/1968   CHOLECYSTECTOMY     COLONOSCOPY     DILATION AND CURETTAGE OF UTERUS     EYE SURGERY Right    MENISCUS REPAIR Bilateral    SIMPLE MASTECTOMY WITH AXILLARY SENTINEL NODE BIOPSY Right 04/13/2020   Procedure: RIGHT MASTECTOMY WITH RIGHT AXILLARY SENTINEL NODE BIOPSY;  Surgeon: Coralie Keens, MD;  Location: [place];  Service: General;  Laterality: Right;   [place] Right 02/22/2021   Procedure: RIGHT TOTAL KNEE ARTHROPLASTY;  Surgeon: Leandrew Koyanagi, MD;  Location: [place];  Service: Orthopedics;  Laterality: Right;    There were no vitals filed for this visit.   Subjective  Assessment - 04/27/21 1355     Subjective Pt arriving today reporting 3-4/10 pain in right knee. Pt with pain more on lateral joint line of right knee.    Pertinent History Rt breast cancer DCIS, LCIS, ER/PR positive s/p mastectomy with 0/3 nodes removed on 04/13/20 Dr. Ninfa Linden and radiation status unknown.  . Other history includes anxiety, OA, hysterectomy, DM    Diagnostic tests X-ray    Patient Stated Goals Walk without walker, function without pain    Currently in Pain? Yes    Pain Score 4     Pain Location Knee    Pain Orientation Right    Pain Descriptors / Indicators Aching;Sore;Tightness    Pain Type Surgical pain    Pain Onset More than a month ago                [facility] PT Assessment - 04/27/21 0001       Assessment   Medical Diagnosis M17.11 primary OA of right knee, R TKA    Referring Provider (PT) Frankey Shown MD      AROM   Right Knee Extension -4    Right Knee Flexion  115      PROM   Right/Left Knee Right    Right Knee Extension -2    Right Knee Flexion 118                           [facility] Adult PT Treatment/Exercise - 04/27/21 0001       Therapeutic Activites    Other Therapeutic Activities sit to stand from lowest mat position x 15 with No UE support,      Neuro Re-ed    Neuro Re-ed Details  vectors, side steppig, braiding front and back 20 feet x 4., cone tapping x 4, atleranting feet, tandum walking x 10 feet x 4      Knee/Hip Exercises: Aerobic   Recumbent Bike bike: L4 x 6 minutes      Knee/Hip Exercises: Machines for Strengthening   Cybex Leg Press Rt LE only x 20, 50#, x 10 75#, bilateral LE's 100# x20      Knee/Hip Exercises: Standing   Lateral Step Up Right;10 reps;Hand Hold: 1;Step Height: 6"    Step Down 10 reps;Hand Hold: 1;Step Height: 6"    Step Down Limitations pain on lateral joint line    Rocker Board 2 minutes    Rocker Board Limitations side to side    Other Standing Knee Exercises --      Knee/Hip  Exercises: Supine   Quad Sets AROM;5 reps    Heel Slides AAROM;Right;5 reps;Limitations    Heel Slides Limitations --                       PT Short Term Goals - 04/12/21 1128       PT SHORT TERM GOAL #1   Title Pt will be independent in her initial HEP.    Status Achieved      PT SHORT TERM GOAL #2   Title Pt will be able to perform sit to stand with no UE support from standard chair independently.    Status Achieved               PT Long Term Goals - 04/27/21 1358       PT LONG TERM GOAL #1   Title Pt will be independent in her advanced HEP.    Status On-going      PT LONG TERM GOAL #2   Title Pt will be able to perform 5 time sit to stand in </= 12 seconds with no UE support.    Status Achieved      PT LONG TERM GOAL #3   Title Pt will improve FOTO score to >/= 71%    Status On-going      PT LONG TERM GOAL #4   Title Pt will perform right knee active ROM  0 to 120 degrees to improve functional mobility and gait.    Baseline 2-118 passively on 04/27/2021    Status On-going      PT LONG TERM GOAL #5   Title Pt will be able to amb community surfaces without device with normalize gait pattern.    Status On-going      PT LONG TERM GOAL #6   Title Pt will be able to navigate 1 flight of stairs with single hand rail with step over step.    Status Achieved               Plan: Pt arriving reporting more lateral knee pain. Pt  tolerating exercises well. Pt has currently met 50% of her LTG's. Continue to focus on strengthening for improved functional mobility as well as dynamic balance.     Patient will benefit from skilled therapeutic intervention in order to improve the following deficits and impairments:     Visit Diagnosis: Acute pain of right knee  Stiffness of right knee, not elsewhere classified  Difficulty in walking, not elsewhere classified  Localized edema     Problem List Patient Active Problem List   Diagnosis Date  Noted   Status post total right knee replacement 02/22/2021   History of right breast cancer 04/13/2020   Genetic testing 03/10/2020   Family history of breast cancer 02/28/2020   Malignant neoplasm of upper-outer quadrant of right breast in female, estrogen receptor positive ([place]) 02/26/2020   Primary osteoarthritis of right knee 03/20/2019   Primary osteoarthritis of left knee 03/20/2019   Primary osteoarthritis of both knees 04/24/2017   Spondylolisthesis at L3-L4 level 06/01/2015    Oretha Caprice, PT MPT 04/27/2021, 2:00 PM  [facility] [address] [place], [place], [address] Phone: [phone]   Fax:  [phone]  Name: Xin Klawitter MRN: [id number] Date of Birth: [birth year]

## 2021-04-29 ENCOUNTER — Other Ambulatory Visit: Payer: Self-pay

## 2021-04-29 ENCOUNTER — Encounter: Payer: Self-pay | Admitting: Physical Therapy

## 2021-04-29 ENCOUNTER — Ambulatory Visit (INDEPENDENT_AMBULATORY_CARE_PROVIDER_SITE_OTHER): Payer: Medicare Other | Admitting: Physical Therapy

## 2021-04-29 DIAGNOSIS — R262 Difficulty in walking, not elsewhere classified: Secondary | ICD-10-CM | POA: Diagnosis not present

## 2021-04-29 DIAGNOSIS — M25661 Stiffness of right knee, not elsewhere classified: Secondary | ICD-10-CM | POA: Diagnosis not present

## 2021-04-29 DIAGNOSIS — R600 Localized edema: Secondary | ICD-10-CM | POA: Diagnosis not present

## 2021-04-29 DIAGNOSIS — M25561 Pain in right knee: Secondary | ICD-10-CM

## 2021-04-29 NOTE — Therapy (Signed)
[facility] [address] [place], [place], [address] Phone: [phone]   Fax:  [phone]  Physical Therapy Treatment  Patient Details  Name: Judith Chavez MRN: [id number] Date of Birth: 01/06/1946 Referring Provider (PT): Frankey Shown MD   Encounter Date: 04/29/2021   PT End of Session - 04/29/21 1551     Visit Number 12    Number of Visits 24    Date for PT Re-Evaluation 06/04/21    Authorization Type PN sent on 04/05/2021 at 7th visit    Progress Note Due on Visit 17    PT Start Time 1513    PT Stop Time 1557    PT Time Calculation (min) 44 min    Activity Tolerance Patient tolerated treatment well    Behavior During Therapy [facility] for tasks assessed/performed             Past Medical History:  Diagnosis Date   Anemia    as a teenager   Anxiety    Arthritis    knees   Cancer ([place]) 03/2020   right breast DCIS, LCIS   Depression    Diabetes mellitus without complication ([place])    Type 2   Family history of breast cancer 02/28/2020   Headache    migraines   History of pneumonia    Numbness    "left foot"    Pneumonia    Poor circulation     Past Surgical History:  Procedure Laterality Date   ABCESS DRAINAGE     "after cesarean- in hospital for 3 months"   [place]  07/11/1968   CHOLECYSTECTOMY     COLONOSCOPY     DILATION AND CURETTAGE OF UTERUS     EYE SURGERY Right    MENISCUS REPAIR Bilateral    SIMPLE MASTECTOMY WITH AXILLARY SENTINEL NODE BIOPSY Right 04/13/2020   Procedure: RIGHT MASTECTOMY WITH RIGHT AXILLARY SENTINEL NODE BIOPSY;  Surgeon: Coralie Keens, MD;  Location: [place];  Service: General;  Laterality: Right;   [place] Right 02/22/2021   Procedure: RIGHT TOTAL KNEE ARTHROPLASTY;  Surgeon: Leandrew Koyanagi, MD;  Location: [place];  Service: Orthopedics;  Laterality: Right;    There were no vitals  filed for this visit.   Subjective Assessment - 04/29/21 1516     Subjective knee is slowly progressing; pain is about a 2/10 today.    Pertinent History Rt breast cancer DCIS, LCIS, ER/PR positive s/p mastectomy with 0/3 nodes removed on 04/13/20 Dr. Ninfa Linden and radiation status unknown.  . Other history includes anxiety, OA, hysterectomy, DM    Diagnostic tests X-ray    Patient Stated Goals Walk without walker, function without pain    Currently in Pain? Yes    Pain Score 2     Pain Location Knee    Pain Orientation Right    Pain Descriptors / Indicators Aching;Sore;Tightness    Pain Type Surgical pain    Pain Onset More than a month ago    Pain Frequency Constant    Aggravating Factors  general constant pain    Pain Relieving Factors ice, meds                [facility] PT Assessment - 04/29/21 1529       Assessment   Medical Diagnosis M17.11 primary OA of right knee, R TKA    Referring Provider (PT)  Frankey Shown MD      Observation/Other Assessments   Focus on Therapeutic Outcomes (FOTO)  58                           [place] Adult PT Treatment/Exercise - 04/29/21 1517       Knee/Hip Exercises: Aerobic   Recumbent Bike L3 x 8 min      Knee/Hip Exercises: Machines for Strengthening   Cybex Leg Press Rt LE only 3x 10, 50#, bilateral LE's 100# x20      Knee/Hip Exercises: Seated   Other Seated Knee/Hip Exercises seated SLR 2 x 10; Rt      Vasopneumatic   Number Minutes Vasopneumatic  10 minutes    Vasopnuematic Location  Knee    Vasopneumatic Pressure Medium    Vasopneumatic Temperature  34                 Balance Exercises - 04/29/21 1538       Balance Exercises: Standing   Standing Eyes Opened Narrow base of support (BOS);Head turns;Foam/compliant surface   intermittent UE support   Balance Beam sidestepping, forward tandem walking x 5 laps each in // bars with intermittent UE support                PT Education - 04/29/21 1551      Education Details corner balance HEP    Person(s) Educated Patient    Methods Explanation;Demonstration;Handout    Comprehension Verbalized understanding;Returned demonstration;Need further instruction              PT Short Term Goals - 04/12/21 1128       PT SHORT TERM GOAL #1   Title Pt will be independent in her initial HEP.    Status Achieved      PT SHORT TERM GOAL #2   Title Pt will be able to perform sit to stand with no UE support from standard chair independently.    Status Achieved               PT Long Term Goals - 04/27/21 1358       PT LONG TERM GOAL #1   Title Pt will be independent in her advanced HEP.    Status On-going      PT LONG TERM GOAL #2   Title Pt will be able to perform 5 time sit to stand in </= 12 seconds with no UE support.    Status Achieved      PT LONG TERM GOAL #3   Title Pt will improve FOTO score to >/= 71%    Status On-going      PT LONG TERM GOAL #4   Title Pt will perform right knee active ROM  0 to 120 degrees to improve functional mobility and gait.    Baseline 2-118 passively on 04/27/2021    Status On-going      PT LONG TERM GOAL #5   Title Pt will be able to amb community surfaces without device with normalize gait pattern.    Status On-going      PT LONG TERM GOAL #6   Title Pt will be able to navigate 1 flight of stairs with single hand rail with step over step.    Status Achieved                   Plan - 04/29/21 1551     Clinical Impression Statement Pt tolerated  session well today and needs intermittent UE support for dynamic balance activities and compliant surface exercises.  FOTO score improved 3% to date and is progressing towards LTGs.  Will continue to benefit from PT to maximize function.    Personal Factors and Comorbidities Comorbidity 3+    Comorbidities anemia, arthritis, anxiety, CA, DM, HA, pneumonia, abdominal hysterectomy back surgery , cesarean section, meniscus repair  bilateral, rt mastectomy 2021    Examination-Participation Restrictions Cleaning;Meal Prep;Yard Work;Community Activity;Driving;Shop;Laundry    Stability/Clinical Decision Making Stable/Uncomplicated    Rehab Potential Good    PT Frequency 2x / week    PT Duration 12 weeks    PT Treatment/Interventions ADLs/Self Care Home Management;Cryotherapy;Gait training;Stair training;Functional mobility training;Therapeutic activities;Therapeutic exercise;Balance training;Neuromuscular re-education;Patient/family education;Passive range of motion;Scar mobilization;Manual techniques;Dry needling;Taping;Vasopneumatic Device;Moist Heat    PT Next Visit Plan progressing mobility and functional quad strength, stairs with no rail, dynamic balance, functional squats, review balance HEP and continue compliant surface exercises    PT Home Exercise Plan Access Code: [email or web address]    Consulted and Agree with Plan of Care Patient             Patient will benefit from skilled therapeutic intervention in order to improve the following deficits and impairments:  Pain, Postural dysfunction, Decreased range of motion, Decreased activity tolerance, Difficulty walking, Decreased mobility, Decreased strength, Increased edema, Decreased balance  Visit Diagnosis: Acute pain of right knee  Stiffness of right knee, not elsewhere classified  Difficulty in walking, not elsewhere classified  Localized edema     Problem List Patient Active Problem List   Diagnosis Date Noted   Status post total right knee replacement 02/22/2021   History of right breast cancer 04/13/2020   Genetic testing 03/10/2020   Family history of breast cancer 02/28/2020   Malignant neoplasm of upper-outer quadrant of right breast in female, estrogen receptor positive ([place]) 02/26/2020   Primary osteoarthritis of right knee 03/20/2019   Primary osteoarthritis of left knee 03/20/2019   Primary osteoarthritis of both knees 04/24/2017    Spondylolisthesis at L3-L4 level 06/01/2015     Laureen Abrahams, PT, DPT 04/29/21 3:54 PM   [facility] [address] [place], [place], [address] Phone: [phone]   Fax:  [phone]  Name: Judith Chavez MRN: [id number] Date of Birth: 01-07-1946

## 2021-04-29 NOTE — Patient Instructions (Signed)
Access Code: [email or web address] URL: [email or web address] Date: 04/29/2021 Prepared by: Faustino Congress  Program Notes Sit on the edge of a chair or bed with legs hanging down.  Swing legs back and forth for 2-3 minutes.    Exercises Long Sitting Quad Set with Towel Roll Under Heel - 3 x daily - 7 x weekly - 2 sets - 10 reps - 5 seconds hold Supine Active Straight Leg Raise - 3 x daily - 7 x weekly - 2 sets - 10 reps Sit to Stand with Counter Support - 3 x daily - 7 x weekly - 2 sets - 10 reps Seated Heel Slide - 3 x daily - 7 x weekly - 2 sets - 10 reps - 5 seconds hold Seated Long Arc Quad - 3 x daily - 7 x weekly - 2 sets - 10 reps - 5 seconds hold Supine Heel Slide with Strap - 2-3 x daily - 7 x weekly - 2 sets - 10 reps - 5 seconds hold Seated Knee Flexion AAROM - 2-3 x daily - 7 x weekly - 1-2 sets - 10 reps - 10 sec hold Recumbent Bike Step Up - 1-2 x daily - 7 x weekly - 10 reps Standing Knee Flexion Stretch on Step - 2 x daily - 7 x weekly - 2 sets - 10 reps Gastroc Stretch on Book - 2 x daily - 7 x weekly - 3 reps - 30 seconds hold Standing Tandem Balance with Counter Support - 1 x daily - 3 reps - up to 30 second holds hold Romberg Stance on Foam Pad with Head Rotation - 2 x daily - 7 x weekly - 1 sets - 1 reps - 10 rotations each way Romberg Stance with Head Nods on Foam Pad - 2 x daily - 7 x weekly - 1 sets - 1 reps - 10 rotations each way

## 2021-05-03 ENCOUNTER — Other Ambulatory Visit (HOSPITAL_BASED_OUTPATIENT_CLINIC_OR_DEPARTMENT_OTHER): Payer: Self-pay

## 2021-05-03 ENCOUNTER — Ambulatory Visit: Payer: Medicare Other | Attending: Internal Medicine

## 2021-05-03 DIAGNOSIS — Z23 Encounter for immunization: Secondary | ICD-10-CM

## 2021-05-03 MED ORDER — INFLUENZA VAC A&B SA ADJ QUAD 0.5 ML IM PRSY
PREFILLED_SYRINGE | INTRAMUSCULAR | 0 refills | Status: AC
  Filled 2021-05-03 (×3): qty 0.5, 1d supply, fill #0

## 2021-05-03 NOTE — Progress Notes (Signed)
   Covid-19 Vaccination Clinic  Name:  Judith Chavez    MRN: [id number] DOB: 1945-08-26  05/03/2021  [name] was observed post Covid-19 immunization for 15 minutes without incident. She was provided with Vaccine Information Sheet and instruction to access the V-Safe system.   [name] was instructed to call 911 with any severe reactions post vaccine: Difficulty breathing  Swelling of face and throat  A fast heartbeat  A bad rash all over body  Dizziness and weakness   Immunizations Administered     Name Date Dose VIS Date Route   Pfizer Covid-19 Vaccine Bivalent Booster 05/03/2021  9:36 AM 0.3 mL 03/10/2021 Intramuscular   Manufacturer: [place]   Lot: [id number]   [place]: [id number]

## 2021-05-06 ENCOUNTER — Ambulatory Visit (INDEPENDENT_AMBULATORY_CARE_PROVIDER_SITE_OTHER): Payer: Medicare Other | Admitting: Rehabilitative and Restorative Service Providers"

## 2021-05-06 ENCOUNTER — Encounter: Payer: Self-pay | Admitting: Rehabilitative and Restorative Service Providers"

## 2021-05-06 ENCOUNTER — Other Ambulatory Visit: Payer: Self-pay

## 2021-05-06 DIAGNOSIS — M25661 Stiffness of right knee, not elsewhere classified: Secondary | ICD-10-CM

## 2021-05-06 DIAGNOSIS — R262 Difficulty in walking, not elsewhere classified: Secondary | ICD-10-CM | POA: Diagnosis not present

## 2021-05-06 DIAGNOSIS — M25561 Pain in right knee: Secondary | ICD-10-CM | POA: Diagnosis not present

## 2021-05-06 DIAGNOSIS — R600 Localized edema: Secondary | ICD-10-CM | POA: Diagnosis not present

## 2021-05-06 NOTE — Therapy (Signed)
[facility] [address] [place], [place], [address] Phone: [phone]   Fax:  [phone]  Physical Therapy Treatment  Patient Details  Name: Judith Chavez MRN: [id number] Date of Birth: [birth year] Referring Provider (PT): Frankey Shown MD   Encounter Date: 05/06/2021   PT End of Session - 05/06/21 1559     Visit Number 13    Number of Visits 24    Date for PT Re-Evaluation 06/04/21    Authorization Type PN sent on 04/05/2021 at 7th visit    Progress Note Due on Visit 17    PT Start Time 1300    PT Stop Time 1345    PT Time Calculation (min) 45 min    Activity Tolerance Patient tolerated treatment well;No increased pain    Behavior During Therapy [facility] for tasks assessed/performed             Past Medical History:  Diagnosis Date   Anemia    as a teenager   Anxiety    Arthritis    knees   Cancer ([place]) 03/2020   right breast DCIS, LCIS   Depression    Diabetes mellitus without complication ([place])    Type 2   Family history of breast cancer 02/28/2020   Headache    migraines   History of pneumonia    Numbness    "left foot"    Pneumonia    Poor circulation     Past Surgical History:  Procedure Laterality Date   ABCESS DRAINAGE     "after cesarean- in hospital for 3 months"   ABDOMINAL HYSTERECTOMY     BACK SURGERY     CESAREAN SECTION  07/11/1968   CHOLECYSTECTOMY     COLONOSCOPY     DILATION AND CURETTAGE OF UTERUS     EYE SURGERY Right    MENISCUS REPAIR Bilateral    SIMPLE MASTECTOMY WITH AXILLARY SENTINEL NODE BIOPSY Right 04/13/2020   Procedure: RIGHT MASTECTOMY WITH RIGHT AXILLARY SENTINEL NODE BIOPSY;  Surgeon: Coralie Keens, MD;  Location: [place];  Service: General;  Laterality: Right;   [place] Right 02/22/2021   Procedure: RIGHT TOTAL KNEE ARTHROPLASTY;  Surgeon: Leandrew Koyanagi, MD;  Location: [place];  Service: Orthopedics;  Laterality: Right;     There were no vitals filed for this visit.   Subjective Assessment - 05/06/21 1308     Subjective Judith Chavez is feeling beat up from a Covid and flu shot on the same day.  Sleeping is difficulty and swelling is still an issue.    Pertinent History Rt breast cancer DCIS, LCIS, ER/PR positive s/p mastectomy with 0/3 nodes removed on 04/13/20 Dr. Ninfa Linden and radiation status unknown.  . Other history includes anxiety, OA, hysterectomy, DM    How long can you sit comfortably? 30 minutes and start-up stiffness    How long can you stand comfortably? 30-45 minutes    How long can you walk comfortably? 45 minutes    Diagnostic tests X-ray    Patient Stated Goals Walk without walker, function without pain    Currently in Pain? Yes    Pain Score 4     Pain Location Knee    Pain Orientation Right    Pain Descriptors / Indicators Aching;Sore;Tightness    Pain Type Surgical pain    Pain Radiating Towards NA    Pain Onset More than a month ago    Pain Frequency Constant  Aggravating Factors  Sleeping and prolonged standing    Pain Relieving Factors Tramedol and ice    Effect of Pain on Daily Activities Affects sleeping and WB endurance    Multiple Pain Sites No                               [facility] Adult PT Treatment/Exercise - 05/06/21 0001       Therapeutic Activites    Therapeutic Activities ADL's    Other Therapeutic Activities Up and over an 8 inch step with slow eccentrics landing on heel (hands PRN and emphasis on maximizing single leg stance); sit to stand slow eccentrics      Neuro Re-ed    Neuro Re-ed Details  Single-leg stance 2 sets of 5X 10 seconds each (with breaks to catch balance and re-set)      Exercises   Exercises Knee/Hip      Knee/Hip Exercises: Aerobic   Recumbent Bike Seat 4 for 8 minutes      Knee/Hip Exercises: Machines for Strengthening   Cybex Knee Extension 15# Up with both, slow eccentrics R only 12X    Cybex Leg Press B 20X 100# and  R only 15X 50# slow eccentrics      Knee/Hip Exercises: Standing   Other Standing Knee Exercises Hip hike 2 sets of 10 for 3 seconds      Knee/Hip Exercises: Seated   Sit to Sand 10 reps;without UE support   slow eccentrics                    PT Education - 05/06/21 1558     Education Details Emphasis on balance and strengthening (quadriceps and hip abductors) with HEP.  Functional emphasis with stairs, slow eccentric sit to stand and balance.    Person(s) Educated Patient    Methods Explanation;Handout;Demonstration;Verbal cues    Comprehension Need further instruction;Returned demonstration;Verbalized understanding;Verbal cues required              PT Short Term Goals - 04/12/21 1128       PT SHORT TERM GOAL #1   Title Pt will be independent in her initial HEP.    Status Achieved      PT SHORT TERM GOAL #2   Title Pt will be able to perform sit to stand with no UE support from standard chair independently.    Status Achieved               PT Long Term Goals - 04/27/21 1358       PT LONG TERM GOAL #1   Title Pt will be independent in her advanced HEP.    Status On-going      PT LONG TERM GOAL #2   Title Pt will be able to perform 5 time sit to stand in </= 12 seconds with no UE support.    Status Achieved      PT LONG TERM GOAL #3   Title Pt will improve FOTO score to >/= 71%    Status On-going      PT LONG TERM GOAL #4   Title Pt will perform right knee active ROM  0 to 120 degrees to improve functional mobility and gait.    Baseline 2-118 passively on 04/27/2021    Status On-going      PT LONG TERM GOAL #5   Title Pt will be able to amb community surfaces without device with  normalize gait pattern.    Status On-going      PT LONG TERM GOAL #6   Title Pt will be able to navigate 1 flight of stairs with single hand rail with step over step.    Status Achieved                   Plan - 05/06/21 1600     Clinical Impression  Statement Judith Chavez appears to have good AROM.  Functional complaints are consistent with quadriceps and hip abductors weakness noted with gait and functional activities.  Emphasis on strength, function and balance in the office will complement her AROM and strength work at home.    Personal Factors and Comorbidities Comorbidity 3+    Comorbidities anemia, arthritis, anxiety, CA, DM, HA, pneumonia, abdominal hysterectomy back surgery , cesarean section, meniscus repair bilateral, rt mastectomy 2021    Examination-Participation Restrictions Cleaning;Meal Prep;Yard Work;Community Activity;Driving;Shop;Laundry    Stability/Clinical Decision Making Stable/Uncomplicated    Rehab Potential Good    PT Frequency 2x / week    PT Duration 12 weeks    PT Treatment/Interventions ADLs/Self Care Home Management;Cryotherapy;Gait training;Stair training;Functional mobility training;Therapeutic activities;Therapeutic exercise;Balance training;Neuromuscular re-education;Patient/family education;Passive range of motion;Scar mobilization;Manual techniques;Dry needling;Taping;Vasopneumatic Device;Moist Heat    PT Next Visit Plan Stairs (slow eccentrics), balance, quadriceps and hip abductors strength    PT Home Exercise Plan Access Code: [email or web address]    Consulted and Agree with Plan of Care Patient             Patient will benefit from skilled therapeutic intervention in order to improve the following deficits and impairments:  Pain, Postural dysfunction, Decreased range of motion, Decreased activity tolerance, Difficulty walking, Decreased mobility, Decreased strength, Increased edema, Decreased balance  Visit Diagnosis: Difficulty in walking, not elsewhere classified  Localized edema  Stiffness of right knee, not elsewhere classified  Acute pain of right knee     Problem List Patient Active Problem List   Diagnosis Date Noted   Status post total right knee replacement 02/22/2021   History of right breast  cancer 04/13/2020   Genetic testing 03/10/2020   Family history of breast cancer 02/28/2020   Malignant neoplasm of upper-outer quadrant of right breast in female, estrogen receptor positive ([place]) 02/26/2020   Primary osteoarthritis of right knee 03/20/2019   Primary osteoarthritis of left knee 03/20/2019   Primary osteoarthritis of both knees 04/24/2017   Spondylolisthesis at L3-L4 level 06/01/2015    Farley Ly, PT, MPT 05/06/2021, 4:03 PM  [facility] [address] [place], [place], [address] Phone: [phone]   Fax:  [phone]  Name: Judith Chavez MRN: [id number] Date of Birth: 03/26/1946

## 2021-05-06 NOTE — Patient Instructions (Signed)
Access Code: [email or web address] URL: [email or web address] Date: 05/06/2021 Prepared by: Vista Mink  Program Notes Sit on the edge of a chair or bed with legs hanging down.  Swing legs back and forth for 2-3 minutes.    Exercises Long Sitting Quad Set with Towel Roll Under Heel - 3 x daily - 7 x weekly - 2 sets - 10 reps - 5 seconds hold Supine Active Straight Leg Raise - 3 x daily - 7 x weekly - 2 sets - 10 reps Sit to Stand with Counter Support - 3 x daily - 7 x weekly - 2 sets - 10 reps Seated Heel Slide - 3 x daily - 7 x weekly - 2 sets - 10 reps - 5 seconds hold Seated Long Arc Quad - 3 x daily - 7 x weekly - 2 sets - 10 reps - 5 seconds hold Supine Heel Slide with Strap - 2-3 x daily - 7 x weekly - 2 sets - 10 reps - 5 seconds hold Seated Knee Flexion AAROM - 2-3 x daily - 7 x weekly - 1-2 sets - 10 reps - 10 sec hold Recumbent Bike Step Up - 1-2 x daily - 7 x weekly - 10 reps Standing Knee Flexion Stretch on Step - 2 x daily - 7 x weekly - 2 sets - 10 reps Gastroc Stretch on Book - 2 x daily - 7 x weekly - 3 reps - 30 seconds hold Standing Tandem Balance with Counter Support - 1 x daily - 3 reps - up to 30 second holds hold Romberg Stance on Foam Pad with Head Rotation - 2 x daily - 7 x weekly - 1 sets - 1 reps - 10 rotations each way Romberg Stance with Head Nods on Foam Pad - 2 x daily - 7 x weekly - 1 sets - 1 reps - 10 rotations each way Single Leg Stance with Arms Out - 2 x daily - 7 x weekly - 1 sets - 5-10 reps - 10 seconds hold Standing Hip Hiking - 2 x daily - 7 x weekly - 2 sets - 10 reps - 3 seconds hold

## 2021-05-11 ENCOUNTER — Other Ambulatory Visit: Payer: Self-pay

## 2021-05-11 ENCOUNTER — Ambulatory Visit (INDEPENDENT_AMBULATORY_CARE_PROVIDER_SITE_OTHER): Payer: Medicare Other | Admitting: Physical Therapy

## 2021-05-11 ENCOUNTER — Encounter: Payer: Self-pay | Admitting: Physical Therapy

## 2021-05-11 DIAGNOSIS — R600 Localized edema: Secondary | ICD-10-CM

## 2021-05-11 DIAGNOSIS — M25661 Stiffness of right knee, not elsewhere classified: Secondary | ICD-10-CM

## 2021-05-11 DIAGNOSIS — M25561 Pain in right knee: Secondary | ICD-10-CM | POA: Diagnosis not present

## 2021-05-11 DIAGNOSIS — R262 Difficulty in walking, not elsewhere classified: Secondary | ICD-10-CM | POA: Diagnosis not present

## 2021-05-11 NOTE — Therapy (Signed)
[facility] [address] [place], [place], [address] Phone: [phone]   Fax:  [phone]  Physical Therapy Treatment Progress Note  Patient Details  Name: Judith Chavez MRN: [id number] Date of Birth: [birth year] Referring Provider (PT): Frankey Shown MD   Progress Note Reporting Period 04/05/2021 to 05/11/2021   See note below for Objective Data and Assessment of Progress/Goals.     Encounter Date: 05/11/2021   PT End of Session - 05/11/21 1307     Visit Number 14    Number of Visits 24    Date for PT Re-Evaluation 06/04/21    Authorization Type PN sent on 04/05/2021 at 7th visit, PN sent on 05/11/2021 at 14th visit    Progress Note Due on Visit 24    PT Start Time 1300    PT Stop Time 1345    PT Time Calculation (min) 45 min    Activity Tolerance Patient tolerated treatment well;No increased pain    Behavior During Therapy [facility] for tasks assessed/performed             Past Medical History:  Diagnosis Date   Anemia    as a teenager   Anxiety    Arthritis    knees   Cancer ([place]) 03/2020   right breast DCIS, LCIS   Depression    Diabetes mellitus without complication ([place])    Type 2   Family history of breast cancer 02/28/2020   Headache    migraines   History of pneumonia    Numbness    "left foot"    Pneumonia    Poor circulation     Past Surgical History:  Procedure Laterality Date   ABCESS DRAINAGE     "after cesarean- in hospital for 3 months"   ABDOMINAL HYSTERECTOMY     BACK SURGERY     CESAREAN SECTION  07/11/1968   CHOLECYSTECTOMY     COLONOSCOPY     DILATION AND CURETTAGE OF UTERUS     EYE SURGERY Right    MENISCUS REPAIR Bilateral    SIMPLE MASTECTOMY WITH AXILLARY SENTINEL NODE BIOPSY Right 04/13/2020   Procedure: RIGHT MASTECTOMY WITH RIGHT AXILLARY SENTINEL NODE BIOPSY;  Surgeon: Coralie Keens, MD;  Location: [place];  Service: General;  Laterality: Right;   [place] Right 02/22/2021   Procedure: RIGHT TOTAL KNEE ARTHROPLASTY;  Surgeon: Leandrew Koyanagi, MD;  Location: [place];  Service: Orthopedics;  Laterality: Right;    There were no vitals filed for this visit.   Subjective Assessment - 05/11/21 1305     Subjective Pt arriving today reporting right medial knee pain. Pt stating, "I just don't have any energy".    Pertinent History Rt breast cancer DCIS, LCIS, ER/PR positive s/p mastectomy with 0/3 nodes removed on 04/13/20 Dr. Ninfa Linden and radiation status unknown.  . Other history includes anxiety, OA, hysterectomy, DM    Currently in Pain? Yes    Pain Score 4     Pain Location Knee    Pain Orientation Right    Pain Descriptors / Indicators Sharp;Sore    Pain Type Surgical pain    Pain Onset More than a month ago                [facility] PT Assessment - 05/11/21 0001       Assessment   Medical Diagnosis M17.11 primary OA of right knee, R TKA    Referring Provider (PT) Erlinda Hong,  [place] MD      AROM   Right Knee Extension -2    Right Knee Flexion 116                           [facility] Adult PT Treatment/Exercise - 05/11/21 0001       Neuro Re-ed    Neuro Re-ed Details  vectors using sliding disc with UE support with Rt LE in wt bearing x 10, SLS on Airex x 1 minute with intermittent UE support, rocker board x 1 minutes front/back and side to side      Exercises   Exercises Knee/Hip      Knee/Hip Exercises: Stretches   Gastroc Stretch 2 reps;20 seconds      Knee/Hip Exercises: Aerobic   Recumbent Bike Seat 4 for 8 minutes      Knee/Hip Exercises: Machines for Strengthening   Cybex Knee Extension 15# lowering with Rt LE only   2x10   Cybex Leg Press 100# bilateral LE's 2x15, Rt LE only: 62# 2x15      Knee/Hip Exercises: Standing   Lateral Step Up Limitations straddle step on 6 inch step x 10 leading with right LE, pt reporting incresaed pain when attempting to lead with left LE      Vasopneumatic    Number Minutes Vasopneumatic  10 minutes    Vasopnuematic Location  Knee    Vasopneumatic Pressure Medium    Vasopneumatic Temperature  34                       PT Short Term Goals - 04/12/21 1128       PT SHORT TERM GOAL #1   Title Pt will be independent in her initial HEP.    Status Achieved      PT SHORT TERM GOAL #2   Title Pt will be able to perform sit to stand with no UE support from standard chair independently.    Status Achieved               PT Long Term Goals - 05/11/21 1307       PT LONG TERM GOAL #1   Title Pt will be independent in her advanced HEP.    Status On-going      PT LONG TERM GOAL #2   Title Pt will be able to perform 5 time sit to stand in </= 12 seconds with no UE support.    Baseline 12 seconds with no UE support    Status Achieved      PT LONG TERM GOAL #3   Title Pt will improve FOTO score to >/= 71%    Status On-going      PT LONG TERM GOAL #4   Title Pt will perform right knee active ROM  0 to 120 degrees to improve functional mobility and gait.    Status On-going      PT LONG TERM GOAL #5   Title Pt will be able to amb community surfaces without device with normalize gait pattern.    Status On-going      PT LONG TERM GOAL #6   Title Pt will be able to navigate 1 flight of stairs with single hand rail with step over step.    Status Achieved                   Plan - 05/11/21 1329     Clinical Impression  Statement Pt arriving today reporting increased pain on medial right knee joint of 4/10.  Pt also with warmth noted around right knee. Pt was unable to recall any incident. Pt with 3 centimeters of swelling noted compared to her left knee.  Pt tolerating exercises well progressing with overall dynamic balance and strength. AROM arc is -2 to 116 degrees in right knee. Continue with skilled PT to maximize function and work on LTG's set.    Personal Factors and Comorbidities Comorbidity 3+    Comorbidities  anemia, arthritis, anxiety, CA, DM, HA, pneumonia, abdominal hysterectomy back surgery , cesarean section, meniscus repair bilateral, rt mastectomy 2021    Examination-Activity Limitations Reach Overhead;Sleep    Examination-Participation Restrictions Cleaning;Meal Prep;Yard Work;Community Activity;Driving;Shop;Laundry    Stability/Clinical Decision Making Stable/Uncomplicated    Rehab Potential Good    PT Frequency 2x / week    PT Duration 12 weeks    PT Treatment/Interventions ADLs/Self Care Home Management;Cryotherapy;Gait training;Stair training;Functional mobility training;Therapeutic activities;Therapeutic exercise;Balance training;Neuromuscular re-education;Patient/family education;Passive range of motion;Scar mobilization;Manual techniques;Dry needling;Taping;Vasopneumatic Device;Moist Heat    PT Next Visit Plan Stairs (slow eccentrics), balance, quadriceps and hip abductors strength    PT Home Exercise Plan Access Code: [email or web address]    Consulted and Agree with Plan of Care Patient             Patient will benefit from skilled therapeutic intervention in order to improve the following deficits and impairments:  Pain, Postural dysfunction, Decreased range of motion, Decreased activity tolerance, Difficulty walking, Decreased mobility, Decreased strength, Increased edema, Decreased balance  Visit Diagnosis: Difficulty in walking, not elsewhere classified  Localized edema  Stiffness of right knee, not elsewhere classified  Acute pain of right knee     Problem List Patient Active Problem List   Diagnosis Date Noted   Status post total right knee replacement 02/22/2021   History of right breast cancer 04/13/2020   Genetic testing 03/10/2020   Family history of breast cancer 02/28/2020   Malignant neoplasm of upper-outer quadrant of right breast in female, estrogen receptor positive ([place]) 02/26/2020   Primary osteoarthritis of right knee 03/20/2019   Primary osteoarthritis of  left knee 03/20/2019   Primary osteoarthritis of both knees 04/24/2017   Spondylolisthesis at L3-L4 level 06/01/2015    Oretha Caprice, PT, MPT 05/11/2021, 1:41 PM  [facility] Therapy [address] [place], [place], [address] Phone: [phone]   Fax:  [phone]  Name: Glorian Mcdonell MRN: [id number] Date of Birth: 07-27-1945

## 2021-05-12 ENCOUNTER — Encounter: Payer: Self-pay | Admitting: Orthopaedic Surgery

## 2021-05-12 ENCOUNTER — Ambulatory Visit (INDEPENDENT_AMBULATORY_CARE_PROVIDER_SITE_OTHER): Payer: Medicare Other | Admitting: Orthopaedic Surgery

## 2021-05-12 DIAGNOSIS — Z96651 Presence of right artificial knee joint: Secondary | ICD-10-CM

## 2021-05-12 MED ORDER — CELECOXIB 200 MG PO CAPS: 200.0000 mg | ORAL_CAPSULE | Freq: Two times a day (BID) | ORAL | 3 refills | Status: DC

## 2021-05-12 MED ORDER — TRAMADOL HCL 50 MG PO TABS: 50.0000 mg | ORAL_TABLET | Freq: Two times a day (BID) | ORAL | 0 refills | Status: DC | PRN

## 2021-05-12 NOTE — Progress Notes (Signed)
Post-Op Visit Note   Patient: Judith Chavez           Date of Birth: Oct 16, 1945           MRN: [id number] Visit Date: 05/12/2021 PCP: Judith Neer, MD   Assessment & Plan:  Chief Complaint:  Chief Complaint  Patient presents with   Right Knee - Follow-up   Visit Diagnoses:  1. Status post total right knee replacement     Plan: [name] is 11 weeks status post right total knee replacement.  Overall doing much better.  She is in much better spirits today.  She is progressing at physical therapy.  She is realized that the Celebrex really helps with keeping the inflammation at [name].  Right knee shows a fully healed surgical scar.  Range of motion is 2 to 116degrees.  Stable to varus valgus.  Overall [name] has demonstrated significant improvement since we last saw her.  I refilled the tramadol and the Celebrex today.  Dental prophylaxis reinforced.  Recheck in 3 months with two-view x-rays of the right knee.  Follow-Up Instructions: Return in about 3 months (around 08/12/2021).   Orders:  No orders of the defined types were placed in this encounter.  Meds ordered this encounter  Medications   celecoxib (CELEBREX) 200 MG capsule    Sig: Take 1 capsule (200 mg total) by mouth 2 (two) times daily.    Dispense:  30 capsule    Refill:  3   traMADol (ULTRAM) 50 MG tablet    Sig: Take 1-2 tablets (50-100 mg total) by mouth every 12 (twelve) hours as needed.    Dispense:  40 tablet    Refill:  0    Imaging: No results found.  PMFS History: Patient Active Problem List   Diagnosis Date Noted   Status post total right knee replacement 02/22/2021   History of right breast cancer 04/13/2020   Genetic testing 03/10/2020   Family history of breast cancer 02/28/2020   Malignant neoplasm of upper-outer quadrant of right breast in female, estrogen receptor positive ([place]) 02/26/2020   Primary osteoarthritis of right knee 03/20/2019   Primary osteoarthritis of left knee 03/20/2019    Primary osteoarthritis of both knees 04/24/2017   Spondylolisthesis at L3-L4 level 06/01/2015   Past Medical History:  Diagnosis Date   Anemia    as a teenager   Anxiety    Arthritis    knees   Cancer ([place]) 03/2020   right breast DCIS, LCIS   Depression    Diabetes mellitus without complication ([place])    Type 2   Family history of breast cancer 02/28/2020   Headache    migraines   History of pneumonia    Numbness    "left foot"    Pneumonia    Poor circulation     Family History  Problem Relation Age of Onset   Breast cancer Cousin        maternal; dx and d. <50   Breast cancer Cousin        maternal; dx and d. <50   Breast cancer Cousin        maternal; dx and d. <50   Breast cancer Cousin        maternal; dx and d. <50   Cancer Paternal Aunt        unknown type cancer dx 63s    Past Surgical History:  Procedure Laterality Date   ABCESS DRAINAGE     "after cesarean- in hospital  for 3 months"   ABDOMINAL HYSTERECTOMY     BACK SURGERY     CESAREAN SECTION  07/11/1968   CHOLECYSTECTOMY     COLONOSCOPY     DILATION AND CURETTAGE OF UTERUS     EYE SURGERY Right    MENISCUS REPAIR Bilateral    SIMPLE MASTECTOMY WITH AXILLARY SENTINEL NODE BIOPSY Right 04/13/2020   Procedure: RIGHT MASTECTOMY WITH RIGHT AXILLARY SENTINEL NODE BIOPSY;  Surgeon: Coralie Keens, MD;  Location: [place];  Service: General;  Laterality: Right;   SPINE SURGERY     TOTAL KNEE ARTHROPLASTY Right 02/22/2021   Procedure: RIGHT TOTAL KNEE ARTHROPLASTY;  Surgeon: Leandrew Koyanagi, MD;  Location: [place];  Service: Orthopedics;  Laterality: Right;   Social History   Occupational History   Occupation: retired   Tobacco Use   Smoking status: Former    Packs/day: 0.50    Years: 10.00    Pack years: 5.00    Types: Cigarettes    Quit date: 07/11/1986    Years since quitting: 34.8   Smokeless tobacco: Never  Vaping Use   Vaping Use: Never used  Substance and Sexual Activity    Alcohol use: Yes    Comment: occassionally   Drug use: No   Sexual activity: Not on file

## 2021-05-13 ENCOUNTER — Ambulatory Visit (INDEPENDENT_AMBULATORY_CARE_PROVIDER_SITE_OTHER): Payer: Medicare Other | Admitting: Rehabilitative and Restorative Service Providers"

## 2021-05-13 ENCOUNTER — Other Ambulatory Visit: Payer: Self-pay

## 2021-05-13 ENCOUNTER — Encounter: Payer: Self-pay | Admitting: Rehabilitative and Restorative Service Providers"

## 2021-05-13 DIAGNOSIS — R262 Difficulty in walking, not elsewhere classified: Secondary | ICD-10-CM

## 2021-05-13 DIAGNOSIS — M25561 Pain in right knee: Secondary | ICD-10-CM | POA: Diagnosis not present

## 2021-05-13 DIAGNOSIS — M25661 Stiffness of right knee, not elsewhere classified: Secondary | ICD-10-CM | POA: Diagnosis not present

## 2021-05-13 DIAGNOSIS — R600 Localized edema: Secondary | ICD-10-CM | POA: Diagnosis not present

## 2021-05-13 NOTE — Patient Instructions (Signed)
Continue current program with emphasis on quadriceps and hip abductors strength along with single-leg balance (steps and curbs).

## 2021-05-13 NOTE — Therapy (Signed)
[facility] [address] [place], [place], [address] Phone: [phone]   Fax:  [phone]  Physical Therapy Treatment  Patient Details  Name: Judith Chavez MRN: [id number] Date of Birth: June 22, 1946 Referring Provider (PT): Frankey Shown MD   Encounter Date: 05/13/2021   PT End of Session - 05/13/21 1344     Visit Number 15    Number of Visits 24    Date for PT Re-Evaluation 06/04/21    Authorization Type PN sent on 04/05/2021 at 7th visit, PN sent on 05/11/2021 at 14th visit    Progress Note Due on Visit 24    PT Start Time 1301    PT Stop Time 1341    PT Time Calculation (min) 40 min    Activity Tolerance Patient tolerated treatment well;No increased pain    Behavior During Therapy [facility] for tasks assessed/performed             Past Medical History:  Diagnosis Date   Anemia    as a teenager   Anxiety    Arthritis    knees   Cancer ([place]) 03/2020   right breast DCIS, LCIS   Depression    Diabetes mellitus without complication ([place])    Type 2   Family history of breast cancer 02/28/2020   Headache    migraines   History of pneumonia    Numbness    "left foot"    Pneumonia    Poor circulation     Past Surgical History:  Procedure Laterality Date   ABCESS DRAINAGE     "after cesarean- in hospital for 3 months"   ABDOMINAL HYSTERECTOMY     BACK SURGERY     CESAREAN SECTION  07/11/1968   CHOLECYSTECTOMY     COLONOSCOPY     DILATION AND CURETTAGE OF UTERUS     EYE SURGERY Right    MENISCUS REPAIR Bilateral    SIMPLE MASTECTOMY WITH AXILLARY SENTINEL NODE BIOPSY Right 04/13/2020   Procedure: RIGHT MASTECTOMY WITH RIGHT AXILLARY SENTINEL NODE BIOPSY;  Surgeon: Coralie Keens, MD;  Location: [place];  Service: General;  Laterality: Right;   [place] Right 02/22/2021   Procedure: RIGHT TOTAL KNEE ARTHROPLASTY;  Surgeon: Leandrew Koyanagi, MD;  Location: [place];  Service:  Orthopedics;  Laterality: Right;    There were no vitals filed for this visit.   Subjective Assessment - 05/13/21 1306     Subjective Judith Chavez reports "OK" sleep over the last week.  She is doing her exercises 2X/day.    Pertinent History Rt breast cancer DCIS, LCIS, ER/PR positive s/p mastectomy with 0/3 nodes removed on 04/13/20 Dr. Ninfa Linden and radiation status unknown.  . Other history includes anxiety, OA, hysterectomy, DM    How long can you sit comfortably? OK    How long can you stand comfortably? 20-30 minutes    How long can you walk comfortably? 30 minutes    Patient Stated Goals No AD and full extension AROM    Currently in Pain? No/denies    Pain Score 0-No pain    Pain Location Knee    Pain Orientation Right    Pain Descriptors / Indicators Sore    Pain Type Surgical pain    Pain Radiating Towards NA    Pain Onset More than a month ago    Pain Frequency Intermittent    Aggravating Factors  Prolonged weight-bearing    Pain Relieving Factors Tramedol and  ice    Effect of Pain on Daily Activities WB endurance is poor    Multiple Pain Sites No                               [facility] Adult PT Treatment/Exercise - 05/13/21 0001       Therapeutic Activites    Therapeutic Activities ADL's    Other Therapeutic Activities Up and over 4, 6 and 8 inch step with slow eccentrics landing on heel (no hands and emphasis on maximizing single leg stance); sit to stand slow eccentrics; step-down off 4 inch step without hands      Neuro Re-ed    Neuro Re-ed Details  Single-leg stance 2 sets of 10X 5 seconds each (with breaks to catch balance and re-set, less rest needed today)      Exercises   Exercises Knee/Hip      Knee/Hip Exercises: Aerobic   Recumbent Bike Seat 4 for 8 minutes      Knee/Hip Exercises: Machines for Strengthening   Cybex Knee Extension 15# Up with both, slow eccentrics R only 12X, 2 sets    Cybex Leg Press B 20X 100# and R only 15X 50# slow  eccentrics      Knee/Hip Exercises: Standing   Other Standing Knee Exercises TRX squats 10X slow eccentrics    Other Standing Knee Exercises Hip hike 2 sets of 10 for 3 seconds      Knee/Hip Exercises: Seated   Sit to Sand 10 reps;without UE support   slow eccentrics                    PT Education - 05/13/21 1344     Education Details Reviewed HEP with progression of strength, balance and functional activities.    Person(s) Educated Patient    Methods Explanation;Demonstration;Tactile cues;Verbal cues    Comprehension Verbalized understanding;Tactile cues required;Returned demonstration;Need further instruction;Verbal cues required              PT Short Term Goals - 04/12/21 1128       PT SHORT TERM GOAL #1   Title Pt will be independent in her initial HEP.    Status Achieved      PT SHORT TERM GOAL #2   Title Pt will be able to perform sit to stand with no UE support from standard chair independently.    Status Achieved               PT Long Term Goals - 05/11/21 1307       PT LONG TERM GOAL #1   Title Pt will be independent in her advanced HEP.    Status On-going      PT LONG TERM GOAL #2   Title Pt will be able to perform 5 time sit to stand in </= 12 seconds with no UE support.    Baseline 12 seconds with no UE support    Status Achieved      PT LONG TERM GOAL #3   Title Pt will improve FOTO score to >/= 71%    Status On-going      PT LONG TERM GOAL #4   Title Pt will perform right knee active ROM  0 to 120 degrees to improve functional mobility and gait.    Status On-going      PT LONG TERM GOAL #5   Title Pt will be able to amb community surfaces  without device with normalize gait pattern.    Status On-going      PT LONG TERM GOAL #6   Title Pt will be able to navigate 1 flight of stairs with single hand rail with step over step.    Status Achieved                   Plan - 05/13/21 1345     Clinical Impression  Statement Judith Chavez did a better job with her single leg balance and with functional challenges designed to make stairs and curbs easier.  Judith Chavez reports some apprehension with stairs and curbs and reported feeling more comfortable after working on them today.  Quadriceps and hip abductors strength, balance (single-leg) and functional activities remain a high priority with Judith Chavez rehabilitation.    Personal Factors and Comorbidities Comorbidity 3+    Comorbidities anemia, arthritis, anxiety, CA, DM, HA, pneumonia, abdominal hysterectomy back surgery , cesarean section, meniscus repair bilateral, rt mastectomy 2021    Examination-Activity Limitations Reach Overhead;Sleep    Examination-Participation Restrictions Cleaning;Meal Prep;Yard Work;Community Activity;Driving;Shop;Laundry    Stability/Clinical Decision Making Stable/Uncomplicated    Rehab Potential Good    PT Frequency 2x / week    PT Duration 12 weeks    PT Treatment/Interventions ADLs/Self Care Home Management;Cryotherapy;Gait training;Stair training;Functional mobility training;Therapeutic activities;Therapeutic exercise;Balance training;Neuromuscular re-education;Patient/family education;Passive range of motion;Scar mobilization;Manual techniques;Dry needling;Taping;Vasopneumatic Device;Moist Heat    PT Next Visit Plan Stairs (slow eccentrics), balance, quadriceps and hip abductors strength    PT Home Exercise Plan Access Code: [email or web address]    Consulted and Agree with Plan of Care Patient             Patient will benefit from skilled therapeutic intervention in order to improve the following deficits and impairments:  Pain, Postural dysfunction, Decreased range of motion, Decreased activity tolerance, Difficulty walking, Decreased mobility, Decreased strength, Increased edema, Decreased balance  Visit Diagnosis: Difficulty in walking, not elsewhere classified  Localized edema  Stiffness of right knee, not elsewhere  classified  Acute pain of right knee     Problem List Patient Active Problem List   Diagnosis Date Noted   Status post total right knee replacement 02/22/2021   History of right breast cancer 04/13/2020   Genetic testing 03/10/2020   Family history of breast cancer 02/28/2020   Malignant neoplasm of upper-outer quadrant of right breast in female, estrogen receptor positive ([place]) 02/26/2020   Primary osteoarthritis of right knee 03/20/2019   Primary osteoarthritis of left knee 03/20/2019   Primary osteoarthritis of both knees 04/24/2017   Spondylolisthesis at L3-L4 level 06/01/2015    Farley Ly, PT, MPT 05/13/2021, 1:48 PM  [facility] Physical Therapy [address] [place], [place], [address] Phone: [phone]   Fax:  [phone]  Name: Pairlee Sawtell MRN: [id number] Date of Birth: [birth year]

## 2021-05-18 ENCOUNTER — Encounter: Payer: Medicare Other | Admitting: Physical Therapy

## 2021-05-20 ENCOUNTER — Encounter: Payer: Medicare Other | Admitting: Rehabilitative and Restorative Service Providers"

## 2021-05-25 ENCOUNTER — Encounter: Payer: Medicare Other | Admitting: Physical Therapy

## 2021-05-27 ENCOUNTER — Ambulatory Visit (INDEPENDENT_AMBULATORY_CARE_PROVIDER_SITE_OTHER): Payer: Medicare Other | Admitting: Rehabilitative and Restorative Service Providers"

## 2021-05-27 ENCOUNTER — Encounter: Payer: Self-pay | Admitting: Rehabilitative and Restorative Service Providers"

## 2021-05-27 ENCOUNTER — Other Ambulatory Visit: Payer: Self-pay

## 2021-05-27 DIAGNOSIS — M25661 Stiffness of right knee, not elsewhere classified: Secondary | ICD-10-CM | POA: Diagnosis not present

## 2021-05-27 DIAGNOSIS — R600 Localized edema: Secondary | ICD-10-CM | POA: Diagnosis not present

## 2021-05-27 DIAGNOSIS — R262 Difficulty in walking, not elsewhere classified: Secondary | ICD-10-CM | POA: Diagnosis not present

## 2021-05-27 NOTE — Patient Instructions (Signed)
Discussed shifting workouts to 3-4X/week to allow time for rest and recover because AROM is excellent and strength (quadriceps and hip abductors) is the focus.

## 2021-05-27 NOTE — Therapy (Signed)
[facility] Physical Therapy [address] [place], [place], [address] Phone: [phone]   Fax:  [phone]  Physical Therapy Treatment  Patient Details  Name: Judith Chavez MRN: [id number] Date of Birth: 1945/10/01 Referring Provider (PT): Frankey Shown MD   Encounter Date: 05/27/2021   PT End of Session - 05/27/21 1356     Visit Number 16    Number of Visits 24    Date for PT Re-Evaluation 06/04/21    Authorization Type PN sent on 04/05/2021 at 7th visit, PN sent on 05/11/2021 at 14th visit    Progress Note Due on Visit 24    PT Start Time 1302    PT Stop Time 1346    PT Time Calculation (min) 44 min    Activity Tolerance Patient tolerated treatment well;No increased pain    Behavior During Therapy [facility] for tasks assessed/performed             Past Medical History:  Diagnosis Date   Anemia    as a teenager   Anxiety    Arthritis    knees   Cancer ([place]) 03/2020   right breast DCIS, LCIS   Depression    Diabetes mellitus without complication ([place])    Type 2   Family history of breast cancer 02/28/2020   Headache    migraines   History of pneumonia    Numbness    "left foot"    Pneumonia    Poor circulation     Past Surgical History:  Procedure Laterality Date   ABCESS DRAINAGE     "after cesarean- in hospital for 3 months"   ABDOMINAL HYSTERECTOMY     BACK SURGERY     CESAREAN SECTION  07/11/1968   CHOLECYSTECTOMY     COLONOSCOPY     DILATION AND CURETTAGE OF UTERUS     EYE SURGERY Right    MENISCUS REPAIR Bilateral    SIMPLE MASTECTOMY WITH AXILLARY SENTINEL NODE BIOPSY Right 04/13/2020   Procedure: RIGHT MASTECTOMY WITH RIGHT AXILLARY SENTINEL NODE BIOPSY;  Surgeon: Coralie Keens, MD;  Location: [place];  Service: General;  Laterality: Right;   [place] Right 02/22/2021   Procedure: RIGHT TOTAL KNEE ARTHROPLASTY;  Surgeon: Leandrew Koyanagi, MD;  Location: [place];  Service:  Orthopedics;  Laterality: Right;    There were no vitals filed for this visit.   Subjective Assessment - 05/27/21 1306     Subjective [name] reports she did better on her cruise than she expected.  Sleeping is interrupted.  Soreness limited function.    Pertinent History Rt breast cancer DCIS, LCIS, ER/PR positive s/p mastectomy with 0/3 nodes removed on 04/13/20 Dr. Ninfa Linden and radiation status unknown.  . Other history includes anxiety, OA, hysterectomy, DM    How long can you sit comfortably? OK    How long can you stand comfortably? 20-30 minutes    How long can you walk comfortably? 45 minutes (was 30)    Patient Stated Goals No AD and full extension AROM    Currently in Pain? Yes    Pain Score 2     Pain Location Knee    Pain Orientation Right    Pain Descriptors / Indicators Sore    Pain Type Surgical pain    Pain Radiating Towards NA    Pain Onset More than a month ago    Pain Frequency Occasional    Aggravating Factors  Prolonged weight-bearing  Pain Relieving Factors Tylenol and ice    Effect of Pain on Daily Activities Sleep is interrupted as is standing endurance.  Stairs are limiting.    Multiple Pain Sites No                [facility] PT Assessment - 05/27/21 0001       AROM   Right Knee Extension -3    Right Knee Flexion 117                           [facility] Adult PT Treatment/Exercise - 05/27/21 0001       Therapeutic Activites    Therapeutic Activities ADL's    Other Therapeutic Activities Step-up and over with 2 hands, 1 hand and no hands.  Slow eccentrics 6 and 8 inch step.      Neuro Re-ed    Neuro Re-ed Details  Single-leg stance 10X 10 seconds each      Exercises   Exercises Knee/Hip      Knee/Hip Exercises: Machines for Strengthening   Cybex Knee Extension 15# Up with both, slow eccentrics R only 20X    Cybex Leg Press B 20X 100# and R only 15X 50# slow eccentrics                     PT Education - 05/27/21  1353     Education Details Reviewed HEP with emphasis on strength.  Fine tuned stairs without hand assist and slow eccentric control.  Discussed decreasing strengthening to 3-4X/week to allow time for muscle recovery and strength gains.    Person(s) Educated Patient    Methods Explanation;Demonstration;Tactile cues;Verbal cues    Comprehension Verbalized understanding;Tactile cues required;Need further instruction;Returned demonstration;Verbal cues required              PT Short Term Goals - 04/12/21 1128       PT SHORT TERM GOAL #1   Title Pt will be independent in her initial HEP.    Status Achieved      PT SHORT TERM GOAL #2   Title Pt will be able to perform sit to stand with no UE support from standard chair independently.    Status Achieved               PT Long Term Goals - 05/27/21 1344       PT LONG TERM GOAL #1   Title Pt will be independent in her advanced HEP.    Status Achieved      PT LONG TERM GOAL #2   Title Pt will be able to perform 5 time sit to stand in </= 12 seconds with no UE support.    Baseline 12 seconds with no UE support    Status Achieved      PT LONG TERM GOAL #3   Title Pt will improve FOTO score to >/= 71%    Baseline 63 on 05/27/2021    Time 12    Period Weeks    Status On-going    Target Date 06/04/21      PT LONG TERM GOAL #4   Title Pt will perform right knee active ROM  0 to 120 degrees to improve functional mobility and gait.    Baseline -3 to 117 degrees (edema limited)    Time 12    Period Weeks    Status On-going    Target Date 06/04/21      PT  LONG TERM GOAL #5   Title Pt will be able to amb community surfaces without device with normalize gait pattern.    Baseline Did great on her cruise.    Time 12    Period Weeks    Status Achieved      PT LONG TERM GOAL #6   Title Pt will be able to navigate 1 flight of stairs with single hand rail with step over step.    Time 12    Period Weeks    Status Achieved                    Plan - 05/27/21 1357     Clinical Impression Statement Janeann was happy with the way she functioned while on her cruise.  She did a lot of walking and didn't have much difficulty due to her R knee.  Edema is noted today but she still has -3 to 117 degrees of active R knee ROM.  Hip abductors strength is better as lateral lean is less noticeable with gait and single-leg stance was much better vs her last visit.  I think Sherill is functioning better than her FOTO reflects and she will be ready for independent PT soon.    Personal Factors and Comorbidities Comorbidity 3+    Comorbidities anemia, arthritis, anxiety, CA, DM, HA, pneumonia, abdominal hysterectomy back surgery , cesarean section, meniscus repair bilateral, rt mastectomy 2021    Examination-Activity Limitations Reach Overhead;Sleep    Examination-Participation Restrictions Cleaning;Meal Prep;Yard Work;Community Activity;Driving;Shop;Laundry    Stability/Clinical Decision Making Stable/Uncomplicated    Rehab Potential Good    PT Frequency 2x / week    PT Duration 4 weeks    PT Treatment/Interventions ADLs/Self Care Home Management;Cryotherapy;Gait training;Stair training;Functional mobility training;Therapeutic activities;Therapeutic exercise;Balance training;Neuromuscular re-education;Patient/family education;Passive range of motion;Scar mobilization;Manual techniques;Dry needling;Taping;Vasopneumatic Device;Moist Heat    PT Next Visit Plan Stairs (slow eccentrics), balance, quadriceps and hip abductors strength    PT Home Exercise Plan Access Code: [email or web address]    Consulted and Agree with Plan of Care Patient             Patient will benefit from skilled therapeutic intervention in order to improve the following deficits and impairments:  Pain, Postural dysfunction, Decreased range of motion, Decreased activity tolerance, Difficulty walking, Decreased mobility, Decreased strength, Increased edema, Decreased  balance  Visit Diagnosis: Difficulty in walking, not elsewhere classified  Localized edema  Stiffness of right knee, not elsewhere classified     Problem List Patient Active Problem List   Diagnosis Date Noted   Status post total right knee replacement 02/22/2021   History of right breast cancer 04/13/2020   Genetic testing 03/10/2020   Family history of breast cancer 02/28/2020   Malignant neoplasm of upper-outer quadrant of right breast in female, estrogen receptor positive ([place]) 02/26/2020   Primary osteoarthritis of right knee 03/20/2019   Primary osteoarthritis of left knee 03/20/2019   Primary osteoarthritis of both knees 04/24/2017   Spondylolisthesis at L3-L4 level 06/01/2015    Farley Ly, PT, MPT 05/27/2021, 2:01 PM  [facility] Physical Therapy [address] [place], [place], [address] Phone: [phone]   Fax:  [phone]  Name: Kaylani Fromme MRN: [id number] Date of Birth: 1945-07-17

## 2021-05-28 ENCOUNTER — Other Ambulatory Visit (HOSPITAL_BASED_OUTPATIENT_CLINIC_OR_DEPARTMENT_OTHER): Payer: Self-pay

## 2021-05-28 DIAGNOSIS — Z23 Encounter for immunization: Secondary | ICD-10-CM | POA: Diagnosis not present

## 2021-05-28 MED ORDER — PFIZER COVID-19 VAC BIVALENT 30 MCG/0.3ML IM SUSP
INTRAMUSCULAR | 0 refills | Status: DC
  Filled 2021-05-28: qty 0.3, 1d supply, fill #0

## 2021-05-31 ENCOUNTER — Telehealth: Payer: Self-pay | Admitting: *Deleted

## 2021-05-31 NOTE — Telephone Encounter (Signed)
Ortho bundle 90 day call for Right total knee arthroplasty completed.

## 2021-06-01 ENCOUNTER — Ambulatory Visit (INDEPENDENT_AMBULATORY_CARE_PROVIDER_SITE_OTHER): Payer: Medicare Other | Admitting: Physical Therapy

## 2021-06-01 ENCOUNTER — Encounter: Payer: Self-pay | Admitting: Physical Therapy

## 2021-06-01 ENCOUNTER — Other Ambulatory Visit: Payer: Self-pay

## 2021-06-01 DIAGNOSIS — R262 Difficulty in walking, not elsewhere classified: Secondary | ICD-10-CM | POA: Diagnosis not present

## 2021-06-01 DIAGNOSIS — M25561 Pain in right knee: Secondary | ICD-10-CM | POA: Diagnosis not present

## 2021-06-01 DIAGNOSIS — M25661 Stiffness of right knee, not elsewhere classified: Secondary | ICD-10-CM

## 2021-06-01 DIAGNOSIS — R600 Localized edema: Secondary | ICD-10-CM | POA: Diagnosis not present

## 2021-06-01 NOTE — Therapy (Addendum)
[facility] Physical Therapy [address] [place], [place], [address] Phone: [phone]   Fax:  [phone]  Physical Therapy Treatment Recertification/ Progress note - Discharge Note  Patient Details  Name: Judith Chavez MRN: [id number] Date of Birth: 1945-12-15 Referring Provider (PT): Frankey Shown MD Progress Note Reporting Period 05/11/2021 to 06/01/2021   See note below for Objective Data and Assessment of Progress/Goals.      Encounter Date: 06/01/2021   PT End of Session - 06/01/21 1436     Visit Number 17    Number of Visits 24    Date for PT Re-Evaluation 06/04/21    Authorization Type PN sent on 04/05/2021 at 7th visit, PN sent on 05/11/2021 at 14th visit,    Authorization Time Period Recert/PN sent on 91/91/6606.    Progress Note Due on Visit 24    PT Start Time 1345    PT Stop Time 1430    PT Time Calculation (min) 45 min    Activity Tolerance Patient tolerated treatment well;No increased pain    Behavior During Therapy [facility] for tasks assessed/performed             Past Medical History:  Diagnosis Date   Anemia    as a teenager   Anxiety    Arthritis    knees   Cancer ([place]) 03/2020   right breast DCIS, LCIS   Depression    Diabetes mellitus without complication ([place])    Type 2   Family history of breast cancer 02/28/2020   Headache    migraines   History of pneumonia    Numbness    "left foot"    Pneumonia    Poor circulation     Past Surgical History:  Procedure Laterality Date   ABCESS DRAINAGE     "after cesarean- in hospital for 3 months"   ABDOMINAL HYSTERECTOMY     BACK SURGERY     CESAREAN SECTION  07/11/1968   CHOLECYSTECTOMY     COLONOSCOPY     DILATION AND CURETTAGE OF UTERUS     EYE SURGERY Right    MENISCUS REPAIR Bilateral    SIMPLE MASTECTOMY WITH AXILLARY SENTINEL NODE BIOPSY Right 04/13/2020   Procedure: RIGHT MASTECTOMY WITH RIGHT AXILLARY SENTINEL NODE BIOPSY;  Surgeon: Coralie Keens, MD;   Location: [place];  Service: General;  Laterality: Right;   [place] Right 02/22/2021   Procedure: RIGHT TOTAL KNEE ARTHROPLASTY;  Surgeon: Leandrew Koyanagi, MD;  Location: [place];  Service: Orthopedics;  Laterality: Right;    There were no vitals filed for this visit.   Subjective Assessment - 06/01/21 1434     Subjective Pt arriving today reporting pain last night along right anterior knee. Pt still concerned with swelling in her right knee compared to her left.    Pertinent History Rt breast cancer DCIS, LCIS, ER/PR positive s/p mastectomy with 0/3 nodes removed on 04/13/20 Dr. Ninfa Linden and radiation status unknown.  . Other history includes anxiety, OA, hysterectomy, DM    Patient Stated Goals No AD and full extension AROM    Currently in Pain? Yes    Pain Score 4     Pain Location Knee    Pain Orientation Right    Pain Descriptors / Indicators Sore    Pain Type Surgical pain    Pain Onset More than a month ago  [facility] PT Assessment - 06/01/21 0001       Assessment   Medical Diagnosis M17.11 primary OA of right knee    Referring Provider (PT) Frankey Shown MD      AROM   Right Knee Extension -2    Right Knee Flexion 118      PROM   Right Knee Extension 0    Right Knee Flexion 120      Strength   Right Knee Flexion 5/5    Right Knee Extension 5/5    Left Knee Flexion 5/5    Left Knee Extension 5/5                                    PT Education - 06/01/21 1435     Education Details We discussed PT POC and date extension for visits and expectations for conintued swelling and pain periodically following TKA.    Person(s) Educated Patient    Methods Explanation    Comprehension Verbalized understanding              PT Short Term Goals - 04/12/21 1128       PT SHORT TERM GOAL #1   Title Pt will be independent in her initial HEP.    Status Achieved      PT SHORT TERM  GOAL #2   Title Pt will be able to perform sit to stand with no UE support from standard chair independently.    Status Achieved               PT Long Term Goals - 06/01/21 1444       PT LONG TERM GOAL #1   Title Pt will be independent in her advanced HEP.    Status Achieved      PT LONG TERM GOAL #2   Title Pt will be able to perform 5 time sit to stand in </= 12 seconds with no UE support.    Status Achieved      PT LONG TERM GOAL #3   Title Pt will improve FOTO score to >/= 71%    Baseline 63 on 05/27/2021    Status On-going      PT LONG TERM GOAL #4   Title Pt will perform right knee active ROM  0 to 120 degrees to improve functional mobility and gait.    Baseline PROM 0-120 AROM 2-118    Status On-going      PT LONG TERM GOAL #5   Title Pt will be able to amb community surfaces without device with normalize gait pattern.    Baseline Did great on her cruise.    Status Achieved      PT LONG TERM GOAL #6   Title Pt will be able to navigate 1 flight of stairs with single hand rail with step over step.    Status Achieved                   Plan - 06/01/21 1438     Clinical Impression Statement Pt arriving today with concerns about her progress. Pt was edu on expectations with swelling and continued ROM improvements. Pt's current AROM is 2-118 degrees in her right knee. Pt's PROM arc is 0-120 degrees. Pt with grossly 5/5 strength in bilateral knees although pt still reporting pain with end range of motion and with some funtional activities. Pt also waking up periodically at  night with pain in her knee. I am recommending a date extension for cotinued visits to progress pt toward improved FOTO score, AROM to 120 degrees and gym program.    Personal Factors and Comorbidities Comorbidity 3+    Comorbidities anemia, arthritis, anxiety, CA, DM, HA, pneumonia, abdominal hysterectomy back surgery , cesarean section, meniscus repair bilateral, rt mastectomy 2021     Examination-Activity Limitations Reach Overhead;Sleep    Examination-Participation Restrictions Cleaning;Meal Prep;Yard Work;Community Activity;Driving;Shop;Laundry    Stability/Clinical Decision Making Stable/Uncomplicated    Rehab Potential Good    PT Frequency 2x / week    PT Duration 4 weeks    PT Treatment/Interventions ADLs/Self Care Home Management;Cryotherapy;Gait training;Stair training;Functional mobility training;Therapeutic activities;Therapeutic exercise;Balance training;Neuromuscular re-education;Patient/family education;Passive range of motion;Scar mobilization;Manual techniques;Dry needling;Taping;Vasopneumatic Device;Moist Heat    PT Next Visit Plan Stairs (slow eccentrics), balance, quadriceps and hip abductors strength    PT Home Exercise Plan Access Code: [email or web address]    Consulted and Agree with Plan of Care Patient             Patient will benefit from skilled therapeutic intervention in order to improve the following deficits and impairments:  Pain, Postural dysfunction, Decreased range of motion, Decreased activity tolerance, Difficulty walking, Decreased mobility, Decreased strength, Increased edema, Decreased balance  Visit Diagnosis: Difficulty in walking, not elsewhere classified  Localized edema  Stiffness of right knee, not elsewhere classified  Acute pain of right knee     Problem List Patient Active Problem List   Diagnosis Date Noted   Status post total right knee replacement 02/22/2021   History of right breast cancer 04/13/2020   Genetic testing 03/10/2020   Family history of breast cancer 02/28/2020   Malignant neoplasm of upper-outer quadrant of right breast in female, estrogen receptor positive ([place]) 02/26/2020   Primary osteoarthritis of right knee 03/20/2019   Primary osteoarthritis of left knee 03/20/2019   Primary osteoarthritis of both knees 04/24/2017   Spondylolisthesis at L3-L4 level 06/01/2015    Oretha Caprice, PT,  MPT 06/01/2021, 2:51 PM   PHYSICAL THERAPY DISCHARGE SUMMARY  Visits from Start of Care: 17  Current functional level related to goals / functional outcomes: See note   Remaining deficits: See note   Education / Equipment: HEP   Patient agrees to discharge. Patient goals were partially met. Patient is being discharged due to not returning since the last visit.  Scot Jun, PT, DPT, OCS, ATC 08/20/21  8:11 AM    [facility] Physical Therapy [address] [place], [place], [address] Phone: [phone]   Fax:  [phone]  Name: Judith Chavez MRN: [id number] Date of Birth: September 26, 1945

## 2021-06-04 ENCOUNTER — Ambulatory Visit: Payer: Medicare Other | Admitting: Hematology

## 2021-06-04 ENCOUNTER — Other Ambulatory Visit: Payer: Medicare Other

## 2021-06-08 ENCOUNTER — Encounter: Payer: Medicare Other | Admitting: Physical Therapy

## 2021-06-09 ENCOUNTER — Other Ambulatory Visit: Payer: Self-pay

## 2021-06-09 ENCOUNTER — Inpatient Hospital Stay: Payer: Medicare Other | Attending: Hematology

## 2021-06-09 ENCOUNTER — Inpatient Hospital Stay (HOSPITAL_BASED_OUTPATIENT_CLINIC_OR_DEPARTMENT_OTHER): Payer: Medicare Other | Admitting: Hematology

## 2021-06-09 VITALS — BP 141/78 | HR 79 | Temp 98.2°F | Resp 19 | Ht 61.0 in | Wt 162.5 lb

## 2021-06-09 DIAGNOSIS — F419 Anxiety disorder, unspecified: Secondary | ICD-10-CM | POA: Diagnosis not present

## 2021-06-09 DIAGNOSIS — M170 Bilateral primary osteoarthritis of knee: Secondary | ICD-10-CM | POA: Insufficient documentation

## 2021-06-09 DIAGNOSIS — D0511 Intraductal carcinoma in situ of right breast: Secondary | ICD-10-CM | POA: Diagnosis not present

## 2021-06-09 DIAGNOSIS — Z170 Estrogen receptor positive status [ER+]: Secondary | ICD-10-CM | POA: Insufficient documentation

## 2021-06-09 DIAGNOSIS — E119 Type 2 diabetes mellitus without complications: Secondary | ICD-10-CM | POA: Insufficient documentation

## 2021-06-09 DIAGNOSIS — F32A Depression, unspecified: Secondary | ICD-10-CM | POA: Insufficient documentation

## 2021-06-09 DIAGNOSIS — M19012 Primary osteoarthritis, left shoulder: Secondary | ICD-10-CM | POA: Diagnosis not present

## 2021-06-09 DIAGNOSIS — C50411 Malignant neoplasm of upper-outer quadrant of right female breast: Secondary | ICD-10-CM

## 2021-06-09 DIAGNOSIS — D0501 Lobular carcinoma in situ of right breast: Secondary | ICD-10-CM | POA: Diagnosis not present

## 2021-06-09 DIAGNOSIS — Z803 Family history of malignant neoplasm of breast: Secondary | ICD-10-CM | POA: Insufficient documentation

## 2021-06-09 DIAGNOSIS — Z9011 Acquired absence of right breast and nipple: Secondary | ICD-10-CM | POA: Diagnosis not present

## 2021-06-09 LAB — CBC WITH DIFFERENTIAL (CANCER CENTER ONLY)
Abs Immature Granulocytes: 0.03 10*3/uL (ref 0.00–0.07)
Basophils Absolute: 0.1 10*3/uL (ref 0.0–0.1)
Basophils Relative: 1 %
Eosinophils Absolute: 0.2 10*3/uL (ref 0.0–0.5)
Eosinophils Relative: 3 %
HCT: 40.4 % (ref 36.0–46.0)
Hemoglobin: 13.1 g/dL (ref 12.0–15.0)
Immature Granulocytes: 1 %
Lymphocytes Relative: 36 %
Lymphs Abs: 2.3 10*3/uL (ref 0.7–4.0)
MCH: 26.6 pg (ref 26.0–34.0)
MCHC: 32.4 g/dL (ref 30.0–36.0)
MCV: 81.9 fL (ref 80.0–100.0)
Monocytes Absolute: 0.6 10*3/uL (ref 0.1–1.0)
Monocytes Relative: 9 %
Neutro Abs: 3.3 10*3/uL (ref 1.7–7.7)
Neutrophils Relative %: 50 %
Platelet Count: 231 10*3/uL (ref 150–400)
RBC: 4.93 MIL/uL (ref 3.87–5.11)
RDW: 13.8 % (ref 11.5–15.5)
WBC Count: 6.5 10*3/uL (ref 4.0–10.5)
nRBC: 0 % (ref 0.0–0.2)

## 2021-06-09 LAB — CMP (CANCER CENTER ONLY)
ALT: 19 U/L (ref 0–44)
AST: 18 U/L (ref 15–41)
Albumin: 3.7 g/dL (ref 3.5–5.0)
Alkaline Phosphatase: 88 U/L (ref 38–126)
Anion gap: 10 (ref 5–15)
BUN: 19 mg/dL (ref 8–23)
CO2: 24 mmol/L (ref 22–32)
Calcium: 9.2 mg/dL (ref 8.9–10.3)
Chloride: 108 mmol/L (ref 98–111)
Creatinine: 0.7 mg/dL (ref 0.44–1.00)
GFR, Estimated: >60 mL/min (ref >60)
Glucose, Bld: 131 mg/dL — ABNORMAL HIGH (ref 70–99)
Potassium: 4.4 mmol/L (ref 3.5–5.1)
Sodium: 142 mmol/L (ref 135–145)
Total Bilirubin: 0.2 mg/dL — ABNORMAL LOW (ref 0.3–1.2)
Total Protein: 7.4 g/dL (ref 6.5–8.1)

## 2021-06-09 MED ORDER — RALOXIFENE HCL 60 MG PO TABS: 60.0000 mg | ORAL_TABLET | Freq: Every day | ORAL | 1 refills | Status: DC

## 2021-06-09 NOTE — Progress Notes (Signed)
Judith Chavez   [phone] [phone]) [phone]   Clinic Follow up Note   Patient Care Team: Mayra Neer, MD as PCP - General (Family Medicine) Coralie Keens, MD as Consulting Physician (General Surgery) Truitt Merle, MD as Consulting Physician (Hematology) Gery Pray, MD as Consulting Physician (Radiation Oncology) Mauro Kaufmann, RN as Oncology Nurse Navigator Rockwell Germany, RN as Oncology Nurse Navigator Alla Feeling, NP as Nurse Practitioner (Nurse Practitioner)  Date of Service:  06/09/2021  CHIEF COMPLAINT: f/u of right breast DCIS/LCIS  CURRENT THERAPY:  To start Raloxifen 06/10/21  ASSESSMENT & PLAN:  Metha Kolasa is a 75 y.o. female with   1. Right breast DCIS, High Grade, ER /PR+, (+) LCIS -She was diagnosed in 02/2020 with 2.8cm mass in the right breast with DCIS and components of necrosis and LCIS. There was no invasive cancer on either biopsy.  -Because MRI showed a larger area of disease, she underwent right mastectomy by Dr Ninfa Linden on 04/13/20. Path showed right breast 4.2cm DCIS and components of LCIS was completley removed, negative margins and LN negative.  -Her DCIS and LCIS was cured by complete surgical resection. Any form of adjuvant therapy is preventive given her high risk of future breast cancer.  -I started her on Antiestrogen therapy with Tamoxifen $RemoveBefore'20mg'RBYepjGZzDRwM$  once daily in 05/2020.  She is tolerating moderately well, with hot flashes and clear vaginal discharge. This was held during knee replacement 12/03/20. -We discussed switching tamoxifen to Raloxifen, benefit and side effects discussed with her, she is interested. I prescribed today. She will contact us with any side effects. -lab and f/u in 6 months   2. Genetic Testing was negative for pathogenetic mutations except SDHA and HOXB13 c.251G>A variant only   3. Anxiety/Depression  -She has experienced depression and anxiety. She is on Zoloft.  -Given Tamoxifen's drug  interaction with Zoloft, she was switched to Effexor (05/04/20).  -Her anxiety has impacted her sleep. I recommend OTC melatonin or Benadryl to help   4. Comorbidities: DM, Arthritis  -She has arthritis in her knees, shoulders and back. She has had knee and back surgery. She is able to remain active with routine activities but will have some pain.   -Continue to F/u of PCP      PLAN:  -I called in raloxifen -lab and f/u in 6 months   No problem-specific Assessment & Plan notes found for this encounter.   SUMMARY OF ONCOLOGIC HISTORY: Oncology History Overview Note  Cancer Staging Malignant neoplasm of upper-outer quadrant of right breast in female, estrogen receptor positive ([place]) Staging form: Breast, AJCC 8th Edition - Clinical stage from 02/17/2020: Stage 0 (cTis (DCIS), cN0, cM0, ER+, PR+) - Signed by Gardenia Phlegm, NP on 02/26/2020 - Pathologic stage from 04/13/2020: Stage 0 (pTis (DCIS), pN0, cM0, G3, ER+, PR+) - Signed by Alla Feeling, NP on 07/30/2020    Malignant neoplasm of upper-outer quadrant of right breast in female, estrogen receptor positive ([place])  02/13/2020 Mammogram   IMPRESSION: Highly suspicious calcifications within the upper-outer quadrant of the RIGHT breast, measuring 2.8 cm extent, for which stereotactic biopsy is recommended.   02/17/2020 Cancer Staging   Staging form: Breast, AJCC 8th Edition - Clinical stage from 02/17/2020: Stage 0 (cTis (DCIS), cN0, cM0, ER+, PR+) - Signed by Gardenia Phlegm, NP on 02/26/2020    02/17/2020 Initial Biopsy   Diagnosis Breast, right, needle core biopsy, lateral - DUCTAL CARCINOMA IN SITU, HIGH-GRADE WITH NECROSIS AND CALCIFICATIONS. SEE NOTE -  LOBULAR CARCINOMA IN SITU Diagnosis Note DCIS measures 0.5 cm in greatest linear dimension. Dr. Saralyn Pilar reviewed the case and concurs with the diagnosis. A breast prognostic profile (ER, PR) is pending and will be reported in an addendum. [facility] was notified on 02/18/2020.   02/17/2020 Receptors her2   PROGNOSTIC INDICATORS Results: IMMUNOHISTOCHEMICAL AND MORPHOMETRIC ANALYSIS PERFORMED MANUALLY Estrogen Receptor: 95%, POSITIVE, MODERATE STAINING INTENSITY Progesterone Receptor: 70%, POSITIVE, MODERATE STAINING INTENSITY   02/26/2020 Initial Diagnosis   Malignant neoplasm of upper-outer quadrant of right breast in female, estrogen receptor positive ([place])   03/08/2020 Genetic Testing   No pathogenic variants detected in [facility] Common Hereditary Cancers Panel. The Common Hereditary Cancers Panel offered by [facility] includes sequencing and/or deletion duplication testing of the following 48 genes: APC, ATM, AXIN2, BARD1, BMPR1A, BRCA1, BRCA2, BRIP1, CDH1, CDK4, CDKN2A (p14ARF), CDKN2A (p16INK4a), CHEK2, CTNNA1, DICER1, EPCAM (Deletion/duplication testing only), GREM1 (promoter region deletion/duplication testing only), KIT, MEN1, MLH1, MSH2, MSH3, MSH6, MUTYH, NBN, NF1, NHTL1, PALB2, PDGFRA, PMS2, POLD1, POLE, PTEN, RAD50, RAD51C, RAD51D, RNF43, SDHB, SDHC, SDHD, SMAD4, SMARCA4. STK11, TP53, TSC1, TSC2, and VHL.  The following genes were evaluated for sequence changes only: SDHA and HOXB13 c.251G>A variant only. The report date is March 08, 2020.   03/08/2020 Breast MRI   IMPRESSION: 1. Suspicious, linear enhancement involving the upper-outer quadrant of the right breast from mid to posterior depth. Susceptibility artifact from post-biopsy clip is seen along the posterior, inferior margin. The overall suspicious finding span approximately 4.2 x 2.8 cm in the AP by transverse dimensions. Recommendation is for MRI guided biopsy along the anterior margin of this enhancement, particularly at the irregular focus identified on series 10, image 81/144. 2. No suspicious MRI findings in the remainder of the right breast. 3. No MRI evidence of malignancy on the left. 4. No suspicious lymphadenopathy.   03/20/2020 Pathology  Results   Diagnosis Breast, right, needle core biopsy, outer - LOBULAR CARCINOMA IN SITU. Microscopic Comment Immunohistochemistry for E-cadherin is negative. P63, Calponin and SMM-1 demonstrate the presence of myoepithelium. CKAE1AE3 is negative for an occult process. Results reported to [facility] on 03/23/2020. Dr. Melina Copa reviewed.   04/13/2020 Surgery   RIGHT MASTECTOMY WITH RIGHT AXILLARY SENTINEL NODE BIOPSY by Dr Ninfa Linden   04/13/2020 Pathology Results   FINAL MICROSCOPIC DIAGNOSIS:   A. BREAST, RIGHT, MASTECTOMY:  - Ductal carcinoma in situ (DCIS), high-grade with focal necrosis and  calcifications, spanning an area of 4.2 cm  - Lobular carcinoma in situ  - Resection margins are negative for DCIS  - Biopsy site changes  - See oncology table   B. LYMPH NODE, RIGHT AXILLARY, SENTINEL, EXCISION:  - Lymph node, negative for carcinoma (0/1)   C. LYMPH NODE, RIGHT AXILLARY, SENTINEL, EXCISION:  - Lymph node, negative for carcinoma (0/1)   D. LYMPH NODE, RIGHT AXILLARY, SENTINEL, EXCISION:  - Lymph node, negative for carcinoma (0/1)    04/13/2020 Cancer Staging   Staging form: Breast, AJCC 8th Edition - Pathologic stage from 04/13/2020: Stage 0 (pTis (DCIS), pN0, cM0, G3, ER+, PR+) - Signed by Alla Feeling, NP on 07/30/2020    05/29/2020 -  Anti-estrogen oral therapy   Tamoxifen $RemoveBe'20mg'HiAYvyjLC$  once daily    07/30/2020 Survivorship   SCP delivered by Cira Rue, NP       INTERVAL HISTORY:  Barrett Holthaus is here for a follow up of DCIS. She was last seen by me on 12/03/20. She presents to the  clinic alone. We reviewed her diagnosis today in detail. I mentioned that DCIS is technically a type of pre-cancer, and she was surprised. She states she did not know it wasn't actually cancer. I explained that it is cancer, but it is technically a pre-cancer. She states she wouldn't have "gone through all this" if she had known.  She reports continued painful scar tissue  on the right chest wall.   All other systems were reviewed with the patient and are negative.  MEDICAL HISTORY:  Past Medical History:  Diagnosis Date   Anemia    as a teenager   Anxiety    Arthritis    knees   Cancer ([place]) 03/2020   right breast DCIS, LCIS   Depression    Diabetes mellitus without complication ([place])    Type 2   Family history of breast cancer 02/28/2020   Headache    migraines   History of pneumonia    Numbness    "left foot"    Pneumonia    Poor circulation     SURGICAL HISTORY: Past Surgical History:  Procedure Laterality Date   ABCESS DRAINAGE     "after cesarean- in hospital for 3 months"   ABDOMINAL HYSTERECTOMY     BACK SURGERY     CESAREAN SECTION  07/11/1968   CHOLECYSTECTOMY     COLONOSCOPY     DILATION AND CURETTAGE OF UTERUS     EYE SURGERY Right    MENISCUS REPAIR Bilateral    SIMPLE MASTECTOMY WITH AXILLARY SENTINEL NODE BIOPSY Right 04/13/2020   Procedure: RIGHT MASTECTOMY WITH RIGHT AXILLARY SENTINEL NODE BIOPSY;  Surgeon: Coralie Keens, MD;  Location: [facility];  Service: General;  Laterality: Right;   [place] Right 02/22/2021   Procedure: RIGHT TOTAL KNEE ARTHROPLASTY;  Surgeon: Leandrew Koyanagi, MD;  Location: [facility];  Service: Orthopedics;  Laterality: Right;    I have reviewed the social history and family history with the patient and they are unchanged from previous note.  ALLERGIES:  has No Known Allergies.  MEDICATIONS:  Current Outpatient Medications  Medication Sig Dispense Refill   aspirin EC 81 MG tablet Take 1 tablet (81 mg total) by mouth 2 (two) times daily. To be taken after surgery as needed 84 tablet 0   docusate sodium (COLACE) 100 MG capsule Take 1 capsule (100 mg total) by mouth daily as needed. 30 capsule 2   methocarbamol (ROBAXIN) 500 MG tablet Take 1 tablet (500 mg total) by mouth 2 (two) times daily as needed. 20 tablet 0   ondansetron (ZOFRAN) 4 MG  tablet Take 1 tablet (4 mg total) by mouth every 8 (eight) hours as needed for nausea or vomiting. 40 tablet 0   raloxifene (EVISTA) 60 MG tablet Take 1 tablet (60 mg total) by mouth daily. 30 tablet 1   acetaminophen (TYLENOL 8 HOUR) 650 MG CR tablet Take 1 tablet (650 mg total) by mouth every 8 (eight) hours as needed for pain. 60 tablet 0   acyclovir (ZOVIRAX) 800 MG tablet Take 800 mg by mouth 2 (two) times daily as needed (fever blisters). Take for 5 days     ALPRAZolam (XANAX) 0.5 MG tablet Take 0.5 mg by mouth 2 (two) times daily as needed for anxiety.     celecoxib (CELEBREX) 100 MG capsule Take 1 capsule (100 mg total) by mouth 2 (two) times daily. 60 capsule 0   celecoxib (CELEBREX) 200 MG capsule  Take 1 capsule (200 mg total) by mouth 2 (two) times daily. 30 capsule 3   Cholecalciferol (DIALYVITE VITAMIN D 5000) 125 MCG (5000 UT) capsule Take 5,000 Units by mouth daily.     COVID-19 mRNA bivalent vaccine, Pfizer, (PFIZER COVID-19 VAC BIVALENT) injection Inject into the muscle. 0.3 mL 0   famotidine (PEPCID) 20 MG tablet Take 1 tablet (20 mg total) by mouth 2 (two) times daily. 60 tablet 0   gabapentin (NEURONTIN) 300 MG capsule Take 1 capsule (300 mg total) by mouth 3 (three) times daily. 30 capsule 3   ibuprofen (ADVIL) 200 MG tablet Take 200 mg by mouth every 6 (six) hours as needed for headache or cramping.     influenza vaccine adjuvanted (FLUAD) 0.5 ML injection Inject into the muscle. 0.5 mL 0   NOVOFINE 32G X 6 MM MISC AS DIRECTED ONCE A DAY WITH TRESIBA FLEXTOUCH     ONETOUCH DELICA LANCETS 96G MISC 2 (two) times daily. for testing  2   ONETOUCH VERIO test strip USE AS DIRECTED TO TEST BLOOD SUGAR TWICE A DAY  3   polyethylene glycol (MIRALAX / GLYCOLAX) 17 g packet Take 17 g by mouth daily as needed for moderate constipation.     sitaGLIPtin (JANUVIA) 100 MG tablet Take 100 mg by mouth daily.     traMADol (ULTRAM) 50 MG tablet Take 1-2 tablets (50-100 mg total) by mouth every 12  (twelve) hours as needed. 40 tablet 0   TRESIBA FLEXTOUCH 100 UNIT/ML SOPN FlexTouch Pen Inject 30 Units into the skin daily.     venlafaxine XR (EFFEXOR-XR) 37.5 MG 24 hr capsule Take 1 capsule (37.5 mg total) by mouth daily with breakfast. Take with 75 mg tab 90 capsule 1   venlafaxine XR (EFFEXOR-XR) 75 MG 24 hr capsule TAKE 1 CAPSULE (75 MG TOTAL) BY MOUTH DAILY WITH BREAKFAST. TAKE WITH 37.5 MG TAB 90 capsule 1   zolpidem (AMBIEN CR) 6.25 MG CR tablet Take 1 tablet (6.25 mg total) by mouth at bedtime as needed for sleep. Dont take with Xanax or opioid medication 10 tablet 0   No current facility-administered medications for this visit.    PHYSICAL EXAMINATION: ECOG PERFORMANCE STATUS: 0 - Asymptomatic  Vitals:   06/09/21 1416  BP: (!) 141/78  Pulse: 79  Resp: 19  Temp: 98.2 F (36.8 C)  SpO2: 95%   Wt Readings from Last 3 Encounters:  06/09/21 162 lb 8 oz (73.7 kg)  03/22/21 156 lb (70.8 kg)  02/22/21 160 lb (72.6 kg)     GENERAL:alert, no distress and comfortable SKIN: skin color, texture, turgor are normal, no rashes or significant lesions EYES: normal, Conjunctiva are pink and non-injected, sclera clear  NECK: supple, thyroid normal size, non-tender, without nodularity LYMPH:  no palpable lymphadenopathy in the cervical, axillary  LUNGS: clear to auscultation and percussion with normal breathing effort HEART: regular rate & rhythm and no murmurs and no lower extremity edema ABDOMEN:abdomen soft, non-tender and normal bowel sounds, (+) scar tissue from prior surgeries (C-section, hysterectomy) Musculoskeletal:no cyanosis of digits and no clubbing  NEURO: alert & oriented x 3 with fluent speech, no focal motor/sensory deficits BREAST: (+) scar tissue to right chest wall. No palpable mass, nodules or adenopathy bilaterally. Breast exam benign.   LABORATORY DATA:  I have reviewed the data as listed CBC Latest Ref Rng & Units 06/09/2021 02/23/2021 02/15/2021  WBC 4.0 - 10.5  K/uL 6.5 11.0(H) 6.2  Hemoglobin 12.0 - 15.0 g/dL 13.1 12.3 13.2  Hematocrit 36.0 - 46.0 % 40.4 38.3 41.0  Platelets 150 - 400 K/uL 231 213 241     CMP Latest Ref Rng & Units 06/09/2021 02/23/2021 02/22/2021  Glucose 70 - 99 mg/dL 131(H) 340(H) 288(H)  BUN 8 - 23 mg/dL $Remove'19 11 11  'RafojZP$ Creatinine 0.44 - 1.00 mg/dL 0.70 0.72 0.67  Sodium 135 - 145 mmol/L 142 133(L) 133(L)  Potassium 3.5 - 5.1 mmol/L 4.4 4.2 4.0  Chloride 98 - 111 mmol/L 108 102 101  CO2 22 - 32 mmol/L 24 22 21(L)  Calcium 8.9 - 10.3 mg/dL 9.2 8.9 8.5(L)  Total Protein 6.5 - 8.1 g/dL 7.4 - 6.6  Total Bilirubin 0.3 - 1.2 mg/dL 0.2(L) - 0.3  Alkaline Phos 38 - 126 U/L 88 - 54  AST 15 - 41 U/L 18 - 22  ALT 0 - 44 U/L 19 - 19      RADIOGRAPHIC STUDIES: I have personally reviewed the radiological images as listed and agreed with the findings in the report. No results found.    No orders of the defined types were placed in this encounter.  All questions were answered. The patient knows to call the clinic with any problems, questions or concerns. No barriers to learning was detected. The total time spent in the appointment was 30 minutes.     Truitt Merle, MD 06/09/2021   I, Wilburn Mylar, am acting as scribe for Truitt Merle, MD.   I have reviewed the above documentation for accuracy and completeness, and I agree with the above.

## 2021-06-10 ENCOUNTER — Telehealth: Payer: Self-pay | Admitting: Hematology

## 2021-06-10 ENCOUNTER — Encounter: Payer: Medicare Other | Admitting: Rehabilitative and Restorative Service Providers"

## 2021-06-10 ENCOUNTER — Telehealth: Payer: Self-pay | Admitting: Rehabilitative and Restorative Service Providers"

## 2021-06-10 NOTE — Telephone Encounter (Signed)
Scheduled follow-up appointment per 11/30 los. Patient is aware.

## 2021-06-10 NOTE — Telephone Encounter (Signed)
No show.  Unable to connect (no answer).  No further appointments scheduled.

## 2021-06-14 ENCOUNTER — Other Ambulatory Visit: Payer: Self-pay

## 2021-06-14 MED ORDER — RALOXIFENE HCL 60 MG PO TABS: 60.0000 mg | ORAL_TABLET | Freq: Every day | ORAL | 1 refills | Status: DC

## 2021-06-14 NOTE — Progress Notes (Signed)
Pt called stating that [facility] did not get Dr. Ernestina Penna electronic prescription sent on 06/09/2021 for Raloxifene.  Resent prescription to [facility].

## 2021-06-17 DIAGNOSIS — G4700 Insomnia, unspecified: Secondary | ICD-10-CM | POA: Diagnosis not present

## 2021-06-17 DIAGNOSIS — M8580 Other specified disorders of bone density and structure, unspecified site: Secondary | ICD-10-CM | POA: Diagnosis not present

## 2021-06-17 DIAGNOSIS — M1990 Unspecified osteoarthritis, unspecified site: Secondary | ICD-10-CM | POA: Diagnosis not present

## 2021-06-17 DIAGNOSIS — A6000 Herpesviral infection of urogenital system, unspecified: Secondary | ICD-10-CM | POA: Diagnosis not present

## 2021-06-17 DIAGNOSIS — M549 Dorsalgia, unspecified: Secondary | ICD-10-CM | POA: Diagnosis not present

## 2021-06-17 DIAGNOSIS — C50919 Malignant neoplasm of unspecified site of unspecified female breast: Secondary | ICD-10-CM | POA: Diagnosis not present

## 2021-06-17 DIAGNOSIS — E1169 Type 2 diabetes mellitus with other specified complication: Secondary | ICD-10-CM | POA: Diagnosis not present

## 2021-06-17 DIAGNOSIS — I10 Essential (primary) hypertension: Secondary | ICD-10-CM | POA: Diagnosis not present

## 2021-06-17 DIAGNOSIS — F3342 Major depressive disorder, recurrent, in full remission: Secondary | ICD-10-CM | POA: Diagnosis not present

## 2021-06-17 DIAGNOSIS — Z0000 Encounter for general adult medical examination without abnormal findings: Secondary | ICD-10-CM | POA: Diagnosis not present

## 2021-06-21 ENCOUNTER — Ambulatory Visit: Payer: Medicare Other

## 2021-07-27 ENCOUNTER — Telehealth: Payer: Self-pay | Admitting: Orthopaedic Surgery

## 2021-07-27 NOTE — Telephone Encounter (Signed)
Pt called stating she has a dentist appt on 07/29/21 and had surgery in 02/2021 with Dr.Xu so she needs to have an antibiotic called in. Pt would like a CB as soon as this has been sent in please.   [phone]

## 2021-07-28 ENCOUNTER — Other Ambulatory Visit: Payer: Self-pay | Admitting: Physician Assistant

## 2021-07-28 MED ORDER — AMOXICILLIN 500 MG PO CAPS: ORAL_CAPSULE | ORAL | 2 refills | Status: DC

## 2021-07-28 NOTE — Telephone Encounter (Signed)
Ok, we recommend for two years post-op.  I sent in amoxicillin

## 2021-07-28 NOTE — Telephone Encounter (Signed)
I left voicemail for patient advising. 

## 2021-08-02 DIAGNOSIS — D0511 Intraductal carcinoma in situ of right breast: Secondary | ICD-10-CM | POA: Diagnosis not present

## 2021-08-04 ENCOUNTER — Other Ambulatory Visit: Payer: Self-pay | Admitting: Surgical

## 2021-08-06 ENCOUNTER — Other Ambulatory Visit: Payer: Self-pay | Admitting: Orthopaedic Surgery

## 2021-08-12 ENCOUNTER — Other Ambulatory Visit: Payer: Self-pay

## 2021-08-12 ENCOUNTER — Ambulatory Visit (INDEPENDENT_AMBULATORY_CARE_PROVIDER_SITE_OTHER): Payer: Medicare Other

## 2021-08-12 ENCOUNTER — Ambulatory Visit (INDEPENDENT_AMBULATORY_CARE_PROVIDER_SITE_OTHER): Payer: Medicare Other | Admitting: Orthopaedic Surgery

## 2021-08-12 ENCOUNTER — Encounter: Payer: Self-pay | Admitting: Orthopaedic Surgery

## 2021-08-12 DIAGNOSIS — Z96651 Presence of right artificial knee joint: Secondary | ICD-10-CM

## 2021-08-12 NOTE — Progress Notes (Signed)
Post-Op Visit Note   Patient: Judith Chavez           Date of Birth: 1946/01/30           MRN: [id number] Visit Date: 08/12/2021 PCP: Mayra Neer, MD   Assessment & Plan:  Chief Complaint:  Chief Complaint  Patient presents with   Right Knee - Pain   Visit Diagnoses:  1. Status post total right knee replacement     Plan: Patient is a pleasant 76 year old female who comes in today 6 months status post right total knee replacement, date of surgery 02/22/2021.  She has been doing relatively well until the past 2 weeks when she noticed increased pain.  She denies any injury or change in activity.  Pain she has is the medial and lateral aspect of the knee in addition to the popliteal fossa.  Pain is worse with extremes of flexion.  She has had to restart her Celebrex and tramadol.  She denies any fevers.  Right knee exam reveals a fully healed surgical scar without complication.  Range of motion 0 to 120 degrees.  She is stable to valgus varus stress.  She does have medial and lateral joint line tenderness.  She is neurovascular intact distally.  At this point, not concerned for loosening of the prosthesis or joint infection.  I would like for her to lay off activity for a little while and continue her Celebrex and Voltaren as needed.  She will follow-up with [place] in 3 months time for repeat evaluation.  Dental prophylaxis reinforced.  Follow-Up Instructions: Return in about 6 months (around 02/09/2022).   Orders:  Orders Placed This Encounter  Procedures   XR Knee 1-2 Views Right   No orders of the defined types were placed in this encounter.   Imaging: XR Knee 1-2 Views Right  Result Date: 08/12/2021 Stable total knee replacement in good alignment    PMFS History: Patient Active Problem List   Diagnosis Date Noted   Status post total right knee replacement 02/22/2021   History of right breast cancer 04/13/2020   Genetic testing 03/10/2020   Family history of breast cancer  02/28/2020   Malignant neoplasm of upper-outer quadrant of right breast in female, estrogen receptor positive ([place]) 02/26/2020   Primary osteoarthritis of right knee 03/20/2019   Primary osteoarthritis of left knee 03/20/2019   Primary osteoarthritis of both knees 04/24/2017   Spondylolisthesis at L3-L4 level 06/01/2015   Past Medical History:  Diagnosis Date   Anemia    as a teenager   Anxiety    Arthritis    knees   Cancer ([place] [facility]) 03/2020   right breast DCIS, LCIS   Depression    Diabetes mellitus without complication ([place])    Type 2   Family history of breast cancer 02/28/2020   Headache    migraines   History of pneumonia    Numbness    "left foot"    Pneumonia    Poor circulation     Family History  Problem Relation Age of Onset   Breast cancer Cousin        maternal; dx and d. <50   Breast cancer Cousin        maternal; dx and d. <50   Breast cancer Cousin        maternal; dx and d. <50   Breast cancer Cousin        maternal; dx and d. <50   Cancer Paternal Aunt  unknown type cancer dx 67s    Past Surgical History:  Procedure Laterality Date   ABCESS DRAINAGE     "after cesarean- in hospital for 3 months"   ABDOMINAL HYSTERECTOMY     BACK SURGERY     CESAREAN SECTION  07/11/1968   CHOLECYSTECTOMY     COLONOSCOPY     DILATION AND CURETTAGE OF UTERUS     EYE SURGERY Right    MENISCUS REPAIR Bilateral    SIMPLE MASTECTOMY WITH AXILLARY SENTINEL NODE BIOPSY Right 04/13/2020   Procedure: RIGHT MASTECTOMY WITH RIGHT AXILLARY SENTINEL NODE BIOPSY;  Surgeon: Coralie Keens, MD;  Location: [facility];  Service: General;  Laterality: Right;   SPINE SURGERY     TOTAL KNEE ARTHROPLASTY Right 02/22/2021   Procedure: RIGHT TOTAL KNEE ARTHROPLASTY;  Surgeon: Leandrew Koyanagi, MD;  Location: [facility];  Service: Orthopedics;  Laterality: Right;   Social History   Occupational History   Occupation: retired   Tobacco Use   Smoking status: Former     Packs/day: 0.50    Years: 10.00    Pack years: 5.00    Types: Cigarettes    Quit date: 07/11/1986    Years since quitting: 35.1   Smokeless tobacco: Never  Vaping Use   Vaping Use: Never used  Substance and Sexual Activity   Alcohol use: Yes    Comment: occassionally   Drug use: No   Sexual activity: Not on file

## 2021-09-02 ENCOUNTER — Other Ambulatory Visit: Payer: Self-pay | Admitting: Surgical

## 2021-09-02 NOTE — Telephone Encounter (Signed)
Xu patient

## 2021-09-22 DIAGNOSIS — H2513 Age-related nuclear cataract, bilateral: Secondary | ICD-10-CM | POA: Diagnosis not present

## 2021-09-22 DIAGNOSIS — H35342 Macular cyst, hole, or pseudohole, left eye: Secondary | ICD-10-CM | POA: Diagnosis not present

## 2021-09-25 IMAGING — MR MR CERVICAL SPINE W/O CM
4 of 5 series · 26 of 48 positions shown · non-contrast
Comparison: Cervical spine MRI 05/15/2016.

CLINICAL DATA: 73-year-old female with left arm tingling since last
month.

EXAM:
MRI CERVICAL SPINE WITHOUT CONTRAST
TECHNIQUE: Multiplanar, multisequence MR imaging of the cervical spine was
performed. No intravenous contrast was administered.

[Series 5: T1 · sagittal · 3.0mm · 0.41mm/px · 7 of 13 slices shown]
[im 1/13]
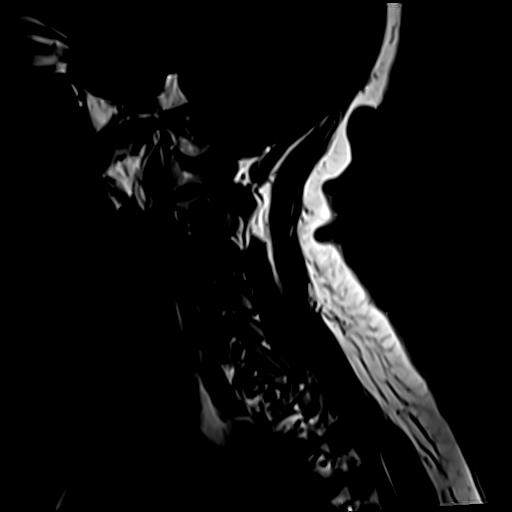
[im 2/13]
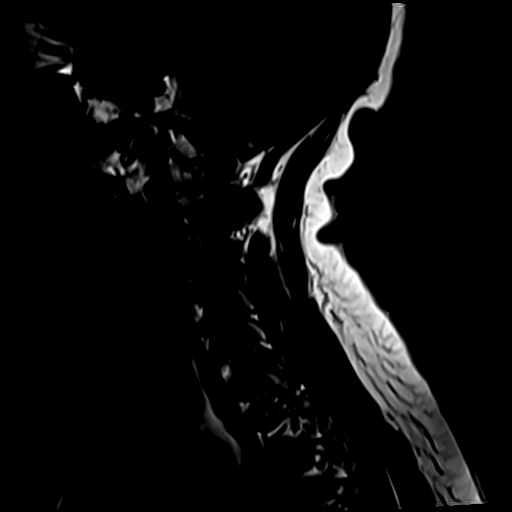
[im 4/13]
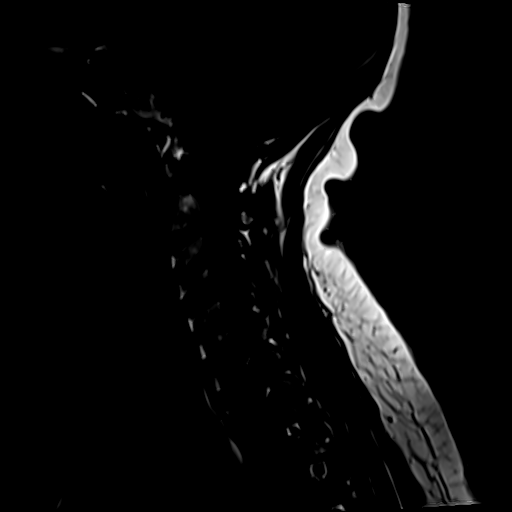
[im 6/13]
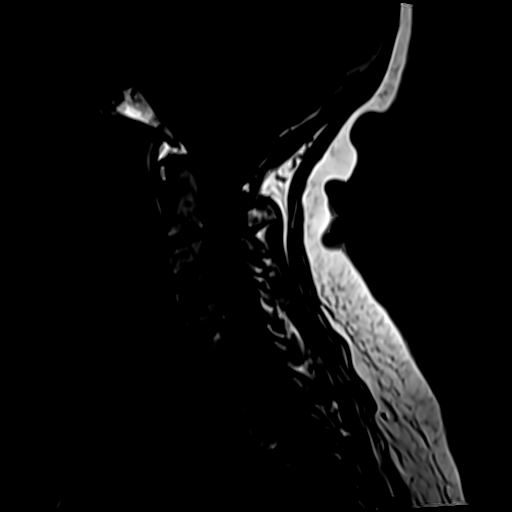
[im 7/13]
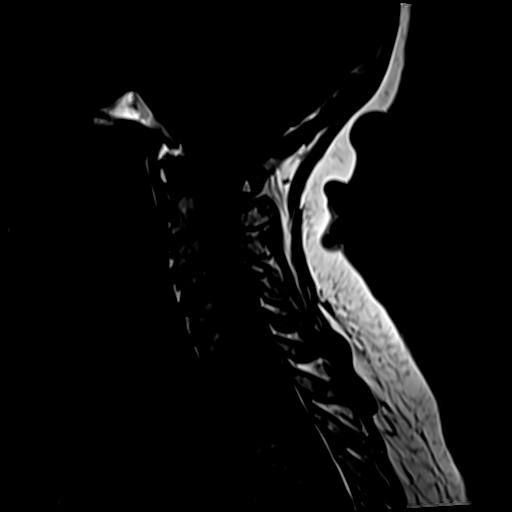
[im 9/13]
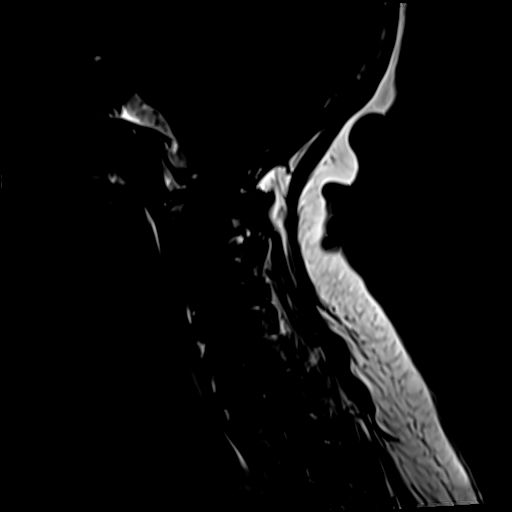
[im 11/13]
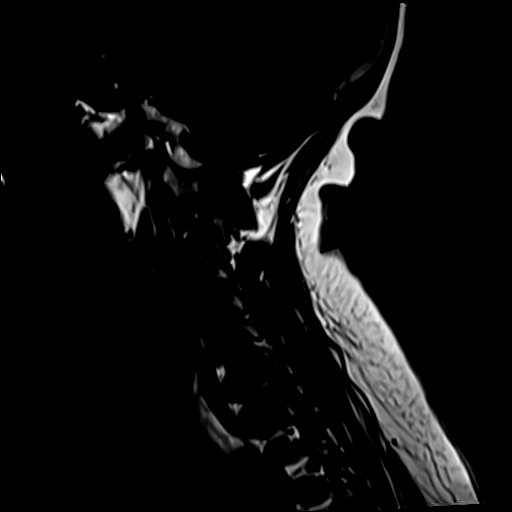

[Series 6: T2 · sagittal · 3.0mm · 0.55mm/px · 7 of 13 slices shown (1 of 2)]
[im 1/13]
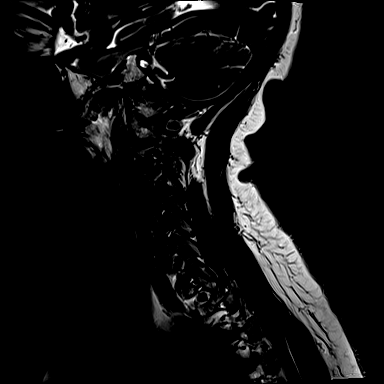
[im 3/13]
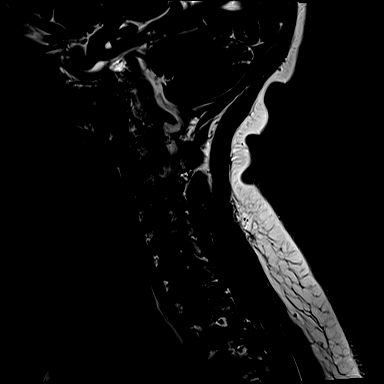
[im 5/13]
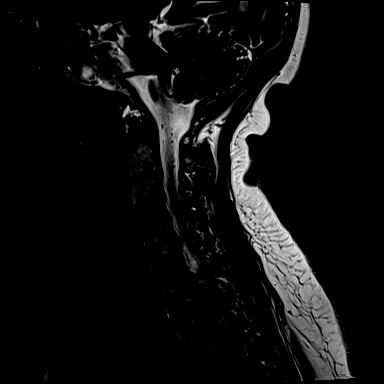
[im 7/13]
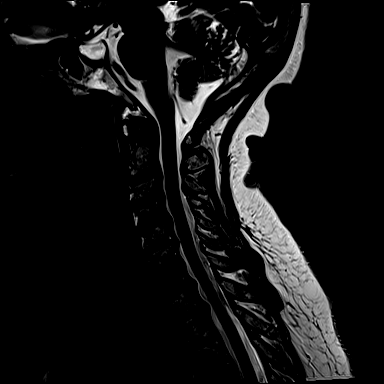
[im 9/13]
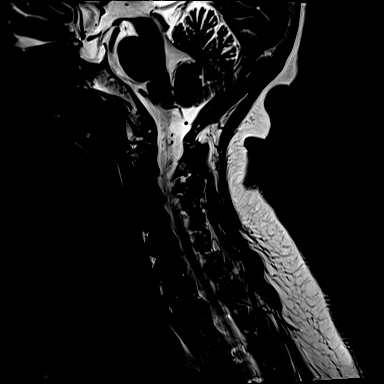
[im 11/13]
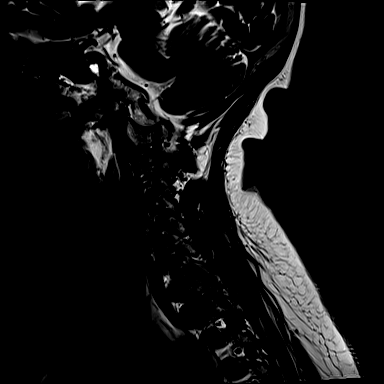
[im 13/13]
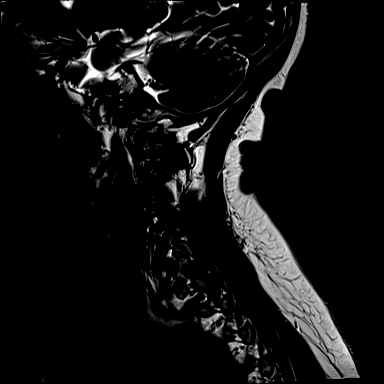

[Series 7: STIR · sagittal · 3.0mm · 0.41mm/px · 3 of 13 slices shown]
[im 3/13]
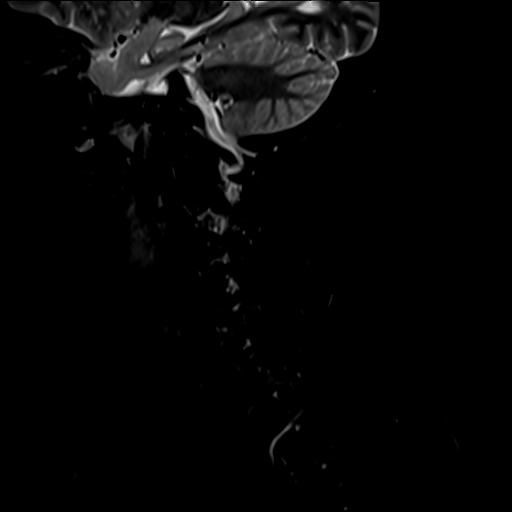
[im 7/13]
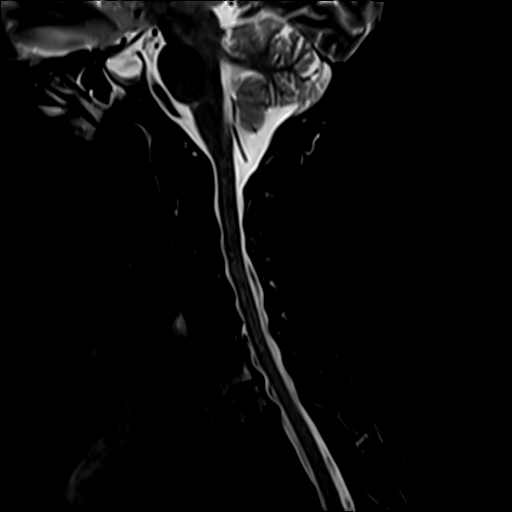
[im 11/13]
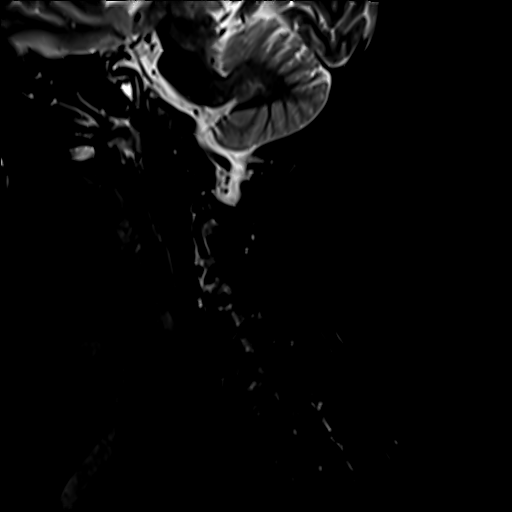

[Series 8: T2 · axial · 3.0mm · 0.50mm/px · z∈[-59,+23]mm · 9 of 24 slices shown (2 of 2)]
[im 1/24]
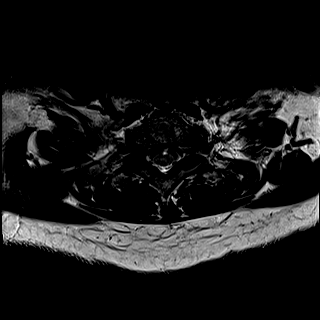
[im 4/24]
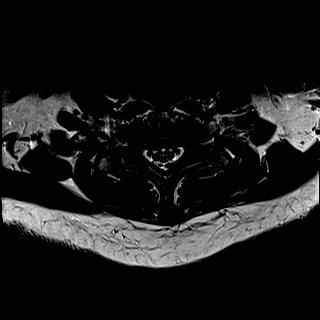
[im 8/24]
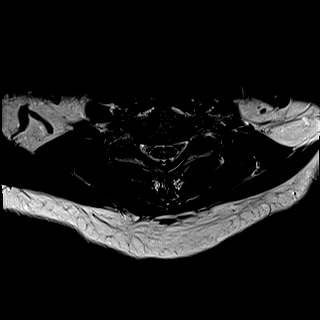
[im 10/24]
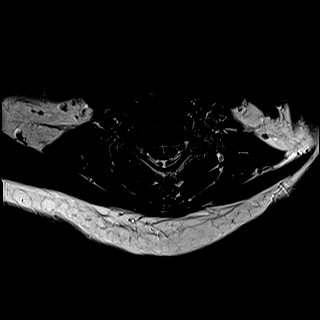
[im 12/24]
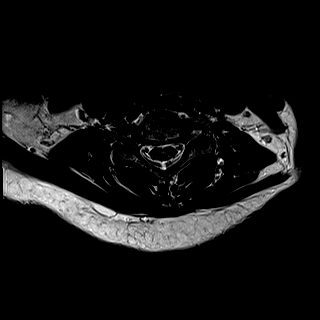
[im 14/24]
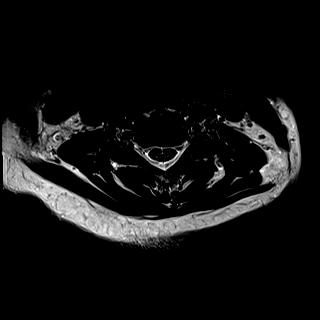
[im 16/24]
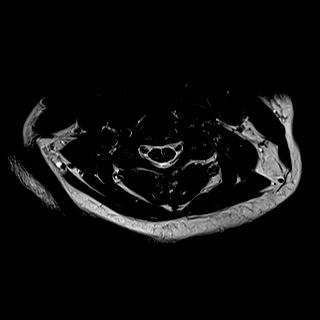
[im 20/24]
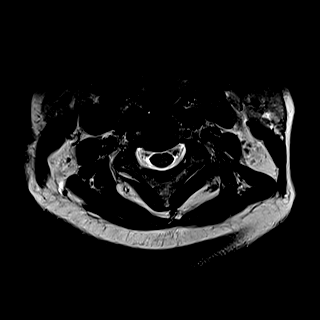
[im 24/24]
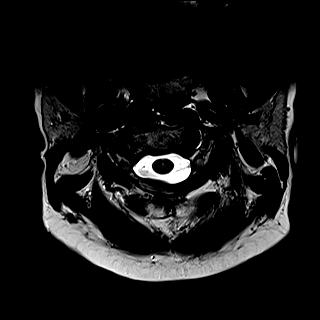

[26 of 48 positions shown; findings below may reference images not displayed]

FINDINGS: Alignment: Improved cervical lordosis since 9968. No
spondylolisthesis.

Vertebrae: Degenerative appearing endplate marrow edema eccentric to
the left at C7-T1 (series 7, image 9). Chronic degenerative endplate
marrow signal changes elsewhere. No other acute osseous abnormality
identified.

Cord: Spinal cord signal is within normal limits at all visualized
levels. Fairly capacious spinal canal.

Posterior Fossa, vertebral arteries, paraspinal tissues:
Cervicomedullary junction is within normal limits. Negative visible
posterior fossa. Grossly negative visible other brain parenchyma.

Preserved major vascular flow voids in the neck. Tortuous vertebral
arteries. Partially retropharyngeal course of the right ICA.
Negative visible neck soft tissues and lung apices.

Disc levels:

C2-C3: Circumferential disc bulge and endplate spurring is stable
since 9968. No stenosis.

C3-C4: Circumferential disc bulge with mild endplate spurring and
mild posterior element hypertrophy. Mild left and mild-to-moderate
right C4 foraminal stenosis plus tortuous right vertebral artery in
the right neural foramen. This level is mildly progressed.

C4-C5: Mild circumferential disc bulge, endplate spurring and
posterior element hypertrophy. Mild left C5 foraminal stenosis
appears stable.

C5-C6: Disc space loss with circumferential but mostly far lateral
disc osteophyte complex. Mild posterior element hypertrophy. No
spinal stenosis. Mild to moderate left and severe right C6 foraminal
stenosis. The right foramen appears progressed, but the left is
stable.

C6-C7: Circumferential disc bulge and endplate spurring with mild
posterior element hypertrophy. Moderate bilateral C7 foraminal
stenosis appears greater on the left but with mild if any
progression since 9968.

C7-T1: Progressed disc space loss. Chronic circumferential disc
bulge which is eccentric to the right. Broad-based posterior
component of disc is mildly increased. Superimposed mild to moderate
posterior element hypertrophy. Still, no spinal stenosis at this
level. Moderate left and severe right C8 foraminal stenosis appears
mildly increased.

T1-T2: Progressed disc space loss and circumferential disc bulge.
Mild posterior element hypertrophy. No significant spinal stenosis.
Moderate to severe bilateral T1 foraminal stenosis.

No other stenosis in the visible upper thoracic.
IMPRESSION: 1. Degenerative endplate marrow edema at C7-T1 is eccentric to the
left and associated with progressed disc degeneration there since
the 9968 MRI. Moderate to severe C8 foraminal stenosis does appear
mildly increased. Query left C8 radiculitis.
2. Frequent neural foraminal stenosis but no cervical spinal
stenosis despite widespread disc and endplate degeneration. Mild
progression of left side neural foraminal stenosis also at the left
C4 (mild) and the left C7 (moderate) nerve levels.
3. Progressed upper thoracic spine degeneration at T1-T2 with up to
severe neural foraminal stenosis. Query also left T1 radiculitis.

## 2021-10-06 ENCOUNTER — Other Ambulatory Visit: Payer: Self-pay | Admitting: Physician Assistant

## 2021-10-08 ENCOUNTER — Telehealth: Payer: Self-pay | Admitting: Orthopaedic Surgery

## 2021-10-08 NOTE — Telephone Encounter (Signed)
Pt would like refill on tramadol ? ?CB [phone]  ?

## 2021-10-11 ENCOUNTER — Other Ambulatory Visit: Payer: Self-pay | Admitting: Physician Assistant

## 2021-10-11 ENCOUNTER — Telehealth: Payer: Self-pay | Admitting: Orthopaedic Surgery

## 2021-10-11 MED ORDER — TRAMADOL HCL 50 MG PO TABS: 50.0000 mg | ORAL_TABLET | Freq: Two times a day (BID) | ORAL | 0 refills | Status: DC | PRN

## 2021-10-11 NOTE — Telephone Encounter (Signed)
Worked her in sooner ? ?

## 2021-10-11 NOTE — Telephone Encounter (Signed)
Pt is in a lot of pain , and would like a early appt all that is open is April 10. She states she cant wait that long, and no one has called her appt pain medication  ?

## 2021-10-11 NOTE — Telephone Encounter (Signed)
Patient aware.

## 2021-10-11 NOTE — Telephone Encounter (Signed)
Sent in

## 2021-10-13 ENCOUNTER — Ambulatory Visit (INDEPENDENT_AMBULATORY_CARE_PROVIDER_SITE_OTHER): Payer: Medicare Other

## 2021-10-13 ENCOUNTER — Encounter: Payer: Self-pay | Admitting: Orthopaedic Surgery

## 2021-10-13 ENCOUNTER — Ambulatory Visit (INDEPENDENT_AMBULATORY_CARE_PROVIDER_SITE_OTHER): Payer: Medicare Other | Admitting: Orthopaedic Surgery

## 2021-10-13 DIAGNOSIS — M1712 Unilateral primary osteoarthritis, left knee: Secondary | ICD-10-CM | POA: Diagnosis not present

## 2021-10-13 DIAGNOSIS — Z96651 Presence of right artificial knee joint: Secondary | ICD-10-CM

## 2021-10-13 MED ORDER — METHYLPREDNISOLONE ACETATE 40 MG/ML IJ SUSP
40.0000 mg | INTRAMUSCULAR | Status: AC | PRN
  Administered 2021-10-13: 40 mg via INTRA_ARTICULAR

## 2021-10-13 MED ORDER — LIDOCAINE HCL 1 % IJ SOLN
2.0000 mL | INTRAMUSCULAR | Status: AC | PRN
  Administered 2021-10-13: 2 mL

## 2021-10-13 MED ORDER — PREDNISONE 10 MG (21) PO TBPK: ORAL_TABLET | ORAL | 3 refills | Status: DC

## 2021-10-13 MED ORDER — BUPIVACAINE HCL 0.5 % IJ SOLN
2.0000 mL | INTRAMUSCULAR | Status: AC | PRN
  Administered 2021-10-13: 2 mL via INTRA_ARTICULAR

## 2021-10-13 NOTE — Progress Notes (Signed)
? ?Office Visit Note ?  ?Patient: Judith Chavez           ?Date of Birth: Mar 15, 1946           ?MRN: [id number] ?Visit Date: 10/13/2021 ?             ?Requested by: Mayra Neer, MD ?[address] ?[address] ?[place],  [place] [address] ?PCP: Mayra Neer, MD ? ? ?Assessment & Plan: ?Visit Diagnoses:  ?1. Status post total right knee replacement   ?2. Primary osteoarthritis of left knee   ? ? ?Plan: For the left knee we performed a cortisone injection today.  For the right lower extremity pain etiology is unclear but I am favoring that its coming from her back.  We will going to try prednisone Dosepak to see how she responds to this.  She will follow-up if her symptoms persist. ? ?Follow-Up Instructions: No follow-ups on file.  ? ?Orders:  ?Orders Placed This Encounter  ?Procedures  ? XR Pelvis 1-2 Views  ? XR KNEE 3 VIEW RIGHT  ? ?Meds ordered this encounter  ?Medications  ? predniSONE (STERAPRED UNI-PAK 21 TAB) 10 MG (21) TBPK tablet  ?  Sig: Take as directed  ?  Dispense:  21 tablet  ?  Refill:  3  ? ? ? ? Procedures: ?Large Joint Inj: L knee on 10/13/2021 9:49 AM ?Details: 22 G needle ?Medications: 2 mL bupivacaine 0.5 %; 2 mL lidocaine 1 %; 40 mg methylPREDNISolone acetate 40 MG/ML ?Outcome: tolerated well, no immediate complications ?Patient was prepped and draped in the usual sterile fashion.  ? ? ? ? ?Clinical Data: ?No additional findings. ? ? ?Subjective: ?Chief Complaint  ?Patient presents with  ? Right Knee - Pain  ? Left Knee - Pain  ? ? ?HPI ? ?[name] comes in today for follow-up left knee DJD requesting cortisone injection.  Prior injection was given in June 2022 which has helped quite a bit.  In regards to the right knee she is having pain that she says starts in the thigh that radiates down the lateral aspect of the knee and into the lateral aspect of the calf and ankle.  Denies any back pain.  She has had prior back surgery and fusion.  Denies any groin pain. ? ?Review of Systems   ?Constitutional: Negative.   ?HENT: Negative.    ?Eyes: Negative.   ?Respiratory: Negative.    ?Cardiovascular: Negative.   ?Endocrine: Negative.   ?Musculoskeletal: Negative.   ?Neurological: Negative.   ?Hematological: Negative.   ?Psychiatric/Behavioral: Negative.    ?All other systems reviewed and are negative. ? ? ?Objective: ?Vital Signs: There were no vitals taken for this visit. ? ?Physical Exam ?Vitals and nursing note reviewed.  ?Constitutional:   ?   Appearance: She is well-developed.  ?Pulmonary:  ?   Effort: Pulmonary effort is normal.  ?Skin: ?   General: Skin is warm.  ?   Capillary Refill: Capillary refill takes less than 2 seconds.  ?Neurological:  ?   Mental Status: She is alert and oriented to person, place, and time.  ?Psychiatric:     ?   Behavior: Behavior normal.     ?   Thought Content: Thought content normal.     ?   Judgment: Judgment normal.  ? ? ?Ortho Exam ? ?Examination of right knee shows mild diffuse swelling.  Trace effusion.  Normal range of motion.  Stable to varus valgus stress.  She is slightly tender to the pes  bursa.  Hip exam is unremarkable.  Lumbar spine exam is nonfocal. ? ?Examination of the left knee is unchanged. ? ?Specialty Comments:  ?No specialty comments available. ? ?Imaging: ?No results found. ? ? ?PMFS History: ?Patient Active Problem List  ? Diagnosis Date Noted  ? Status post total right knee replacement 02/22/2021  ? History of right breast cancer 04/13/2020  ? Genetic testing 03/10/2020  ? Family history of breast cancer 02/28/2020  ? Malignant neoplasm of upper-outer quadrant of right breast in female, estrogen receptor positive ([place]) 02/26/2020  ? Primary osteoarthritis of right knee 03/20/2019  ? Primary osteoarthritis of left knee 03/20/2019  ? Primary osteoarthritis of both knees 04/24/2017  ? Spondylolisthesis at L3-L4 level 06/01/2015  ? ?Past Medical History:  ?Diagnosis Date  ? Anemia   ? as a teenager  ? Anxiety   ? Arthritis   ? knees  ? Cancer  ([facility]) 03/2020  ? right breast DCIS, LCIS  ? Depression   ? Diabetes mellitus without complication ([place])   ? Type 2  ? Family history of breast cancer 02/28/2020  ? Headache   ? migraines  ? History of pneumonia   ? Numbness   ? "left foot"   ? Pneumonia   ? Poor circulation   ?  ?Family History  ?Problem Relation Age of Onset  ? Breast cancer Cousin   ?     maternal; dx and d. <50  ? Breast cancer Cousin   ?     maternal; dx and d. <50  ? Breast cancer Cousin   ?     maternal; dx and d. <50  ? Breast cancer Cousin   ?     maternal; dx and d. <50  ? Cancer Paternal Aunt   ?     unknown type cancer dx 27s  ?  ?Past Surgical History:  ?Procedure Laterality Date  ? ABCESS DRAINAGE    ? "after cesarean- in hospital for 3 months"  ? ABDOMINAL HYSTERECTOMY    ? BACK SURGERY    ? CESAREAN SECTION  07/11/1968  ? CHOLECYSTECTOMY    ? COLONOSCOPY    ? DILATION AND CURETTAGE OF UTERUS    ? EYE SURGERY Right   ? MENISCUS REPAIR Bilateral   ? SIMPLE MASTECTOMY WITH AXILLARY SENTINEL NODE BIOPSY Right 04/13/2020  ? Procedure: RIGHT MASTECTOMY WITH RIGHT AXILLARY SENTINEL NODE BIOPSY;  Surgeon: Coralie Keens, MD;  Location: [facility];  Service: General;  Laterality: Right;  ? SPINE SURGERY    ? TOTAL KNEE ARTHROPLASTY Right 02/22/2021  ? Procedure: RIGHT TOTAL KNEE ARTHROPLASTY;  Surgeon: Leandrew Koyanagi, MD;  Location: [facility];  Service: Orthopedics;  Laterality: Right;  ? ?Social History  ? ?Occupational History  ? Occupation: retired   ?Tobacco Use  ? Smoking status: Former  ?  Packs/day: 0.50  ?  Years: 10.00  ?  Pack years: 5.00  ?  Types: Cigarettes  ?  Quit date: 07/11/1986  ?  Years since quitting: 35.2  ? Smokeless tobacco: Never  ?Vaping Use  ? Vaping Use: Never used  ?Substance and Sexual Activity  ? Alcohol use: Yes  ?  Comment: occassionally  ? Drug use: No  ? Sexual activity: Not on file  ? ? ? ? ? ? ?

## 2021-10-14 DIAGNOSIS — H2513 Age-related nuclear cataract, bilateral: Secondary | ICD-10-CM | POA: Diagnosis not present

## 2021-10-14 DIAGNOSIS — H35342 Macular cyst, hole, or pseudohole, left eye: Secondary | ICD-10-CM | POA: Diagnosis not present

## 2021-10-18 ENCOUNTER — Ambulatory Visit: Payer: Medicare Other | Admitting: Physician Assistant

## 2021-11-08 DIAGNOSIS — M545 Low back pain, unspecified: Secondary | ICD-10-CM | POA: Diagnosis not present

## 2021-11-08 DIAGNOSIS — R350 Frequency of micturition: Secondary | ICD-10-CM | POA: Diagnosis not present

## 2021-12-08 ENCOUNTER — Ambulatory Visit: Payer: Medicare Other | Admitting: Hematology

## 2021-12-08 ENCOUNTER — Other Ambulatory Visit: Payer: Medicare Other

## 2021-12-17 DIAGNOSIS — L539 Erythematous condition, unspecified: Secondary | ICD-10-CM | POA: Diagnosis not present

## 2021-12-17 DIAGNOSIS — E1169 Type 2 diabetes mellitus with other specified complication: Secondary | ICD-10-CM | POA: Diagnosis not present

## 2021-12-17 DIAGNOSIS — E113293 Type 2 diabetes mellitus with mild nonproliferative diabetic retinopathy without macular edema, bilateral: Secondary | ICD-10-CM | POA: Diagnosis not present

## 2021-12-17 DIAGNOSIS — L853 Xerosis cutis: Secondary | ICD-10-CM | POA: Diagnosis not present

## 2021-12-17 DIAGNOSIS — S80262A Insect bite (nonvenomous), left knee, initial encounter: Secondary | ICD-10-CM | POA: Diagnosis not present

## 2021-12-17 DIAGNOSIS — W57XXXA Bitten or stung by nonvenomous insect and other nonvenomous arthropods, initial encounter: Secondary | ICD-10-CM | POA: Diagnosis not present

## 2021-12-28 ENCOUNTER — Encounter: Payer: Self-pay | Admitting: Nurse Practitioner

## 2021-12-28 ENCOUNTER — Other Ambulatory Visit: Payer: Self-pay | Admitting: Nurse Practitioner

## 2021-12-28 ENCOUNTER — Inpatient Hospital Stay (HOSPITAL_BASED_OUTPATIENT_CLINIC_OR_DEPARTMENT_OTHER): Payer: Medicare Other | Admitting: Nurse Practitioner

## 2021-12-28 ENCOUNTER — Inpatient Hospital Stay: Payer: Medicare Other | Attending: Nurse Practitioner

## 2021-12-28 ENCOUNTER — Other Ambulatory Visit: Payer: Self-pay

## 2021-12-28 VITALS — BP 137/92 | HR 67 | Temp 98.0°F | Resp 16 | Wt 161.5 lb

## 2021-12-28 DIAGNOSIS — Z7952 Long term (current) use of systemic steroids: Secondary | ICD-10-CM | POA: Diagnosis not present

## 2021-12-28 DIAGNOSIS — Z9011 Acquired absence of right breast and nipple: Secondary | ICD-10-CM | POA: Diagnosis not present

## 2021-12-28 DIAGNOSIS — Z170 Estrogen receptor positive status [ER+]: Secondary | ICD-10-CM

## 2021-12-28 DIAGNOSIS — E1140 Type 2 diabetes mellitus with diabetic neuropathy, unspecified: Secondary | ICD-10-CM | POA: Insufficient documentation

## 2021-12-28 DIAGNOSIS — Z803 Family history of malignant neoplasm of breast: Secondary | ICD-10-CM | POA: Diagnosis not present

## 2021-12-28 DIAGNOSIS — C50411 Malignant neoplasm of upper-outer quadrant of right female breast: Secondary | ICD-10-CM

## 2021-12-28 DIAGNOSIS — Z79899 Other long term (current) drug therapy: Secondary | ICD-10-CM | POA: Diagnosis not present

## 2021-12-28 DIAGNOSIS — Z1231 Encounter for screening mammogram for malignant neoplasm of breast: Secondary | ICD-10-CM | POA: Diagnosis not present

## 2021-12-28 DIAGNOSIS — D0511 Intraductal carcinoma in situ of right breast: Secondary | ICD-10-CM | POA: Diagnosis not present

## 2021-12-28 DIAGNOSIS — F419 Anxiety disorder, unspecified: Secondary | ICD-10-CM | POA: Diagnosis not present

## 2021-12-28 DIAGNOSIS — F32A Depression, unspecified: Secondary | ICD-10-CM | POA: Diagnosis not present

## 2021-12-28 DIAGNOSIS — M1990 Unspecified osteoarthritis, unspecified site: Secondary | ICD-10-CM | POA: Insufficient documentation

## 2021-12-28 DIAGNOSIS — M8580 Other specified disorders of bone density and structure, unspecified site: Secondary | ICD-10-CM | POA: Diagnosis not present

## 2021-12-28 LAB — CBC WITH DIFFERENTIAL (CANCER CENTER ONLY)
Abs Immature Granulocytes: 0.02 10*3/uL (ref 0.00–0.07)
Basophils Absolute: 0.1 10*3/uL (ref 0.0–0.1)
Basophils Relative: 1 %
Eosinophils Absolute: 0.2 10*3/uL (ref 0.0–0.5)
Eosinophils Relative: 3 %
HCT: 41.9 % (ref 36.0–46.0)
Hemoglobin: 13.5 g/dL (ref 12.0–15.0)
Immature Granulocytes: 0 %
Lymphocytes Relative: 37 %
Lymphs Abs: 2.1 10*3/uL (ref 0.7–4.0)
MCH: 27.3 pg (ref 26.0–34.0)
MCHC: 32.2 g/dL (ref 30.0–36.0)
MCV: 84.8 fL (ref 80.0–100.0)
Monocytes Absolute: 0.5 10*3/uL (ref 0.1–1.0)
Monocytes Relative: 8 %
Neutro Abs: 2.9 10*3/uL (ref 1.7–7.7)
Neutrophils Relative %: 51 %
Platelet Count: 207 10*3/uL (ref 150–400)
RBC: 4.94 MIL/uL (ref 3.87–5.11)
RDW: 13.3 % (ref 11.5–15.5)
WBC Count: 5.6 10*3/uL (ref 4.0–10.5)
nRBC: 0 % (ref 0.0–0.2)

## 2021-12-28 LAB — CMP (CANCER CENTER ONLY)
ALT: 15 U/L (ref 0–44)
AST: 17 U/L (ref 15–41)
Albumin: 4.1 g/dL (ref 3.5–5.0)
Alkaline Phosphatase: 75 U/L (ref 38–126)
Anion gap: 6 (ref 5–15)
BUN: 13 mg/dL (ref 8–23)
CO2: 26 mmol/L (ref 22–32)
Calcium: 9.4 mg/dL (ref 8.9–10.3)
Chloride: 106 mmol/L (ref 98–111)
Creatinine: 0.64 mg/dL (ref 0.44–1.00)
GFR, Estimated: >60 mL/min (ref >60)
Glucose, Bld: 119 mg/dL — ABNORMAL HIGH (ref 70–99)
Potassium: 4.1 mmol/L (ref 3.5–5.1)
Sodium: 138 mmol/L (ref 135–145)
Total Bilirubin: 0.3 mg/dL (ref 0.3–1.2)
Total Protein: 7.4 g/dL (ref 6.5–8.1)

## 2021-12-28 MED ORDER — TAMOXIFEN CITRATE 10 MG PO TABS: 5.0000 mg | ORAL_TABLET | Freq: Every day | ORAL | 0 refills | Status: DC

## 2021-12-28 MED ORDER — EXEMESTANE 25 MG PO TABS: 12.5000 mg | ORAL_TABLET | Freq: Every day | ORAL | 0 refills | Status: DC

## 2021-12-28 MED ORDER — GABAPENTIN 100 MG PO CAPS: 100.0000 mg | ORAL_CAPSULE | Freq: Every day | ORAL | 1 refills | Status: DC

## 2021-12-28 NOTE — Progress Notes (Signed)
[facility]   [phone] [phone]   Clinic Follow up Note   Patient Care Team: Mayra Neer, MD as PCP - General (Family Medicine) Coralie Keens, MD as Consulting Physician (General Surgery) Truitt Merle, MD as Consulting Physician (Hematology) Gery Pray, MD as Consulting Physician (Radiation Oncology) Mauro Kaufmann, RN as Oncology Nurse Navigator Rockwell Germany, RN as Oncology Nurse Navigator Alla Feeling, NP as Nurse Practitioner (Nurse Practitioner) 12/28/2021  CHIEF COMPLAINT: Follow-up right breast DCIS/LCIS  SUMMARY OF ONCOLOGIC HISTORY: Oncology History Overview Note  Cancer Staging Malignant neoplasm of upper-outer quadrant of right breast in female, estrogen receptor positive ([facility]) Staging form: Breast, AJCC 8th Edition - Clinical stage from 02/17/2020: Stage 0 (cTis (DCIS), cN0, cM0, ER+, PR+) - Signed by Gardenia Phlegm, NP on 02/26/2020 - Pathologic stage from 04/13/2020: Stage 0 (pTis (DCIS), pN0, cM0, G3, ER+, PR+) - Signed by Alla Feeling, NP on 07/30/2020    Malignant neoplasm of upper-outer quadrant of right breast in female, estrogen receptor positive ([facility])  02/13/2020 Mammogram   IMPRESSION: Highly suspicious calcifications within the upper-outer quadrant of the RIGHT breast, measuring 2.8 cm extent, for which stereotactic biopsy is recommended.   02/17/2020 Cancer Staging   Staging form: Breast, AJCC 8th Edition - Clinical stage from 02/17/2020: Stage 0 (cTis (DCIS), cN0, cM0, ER+, PR+) - Signed by Gardenia Phlegm, NP on 02/26/2020   02/17/2020 Initial Biopsy   Diagnosis Breast, right, needle core biopsy, lateral - DUCTAL CARCINOMA IN SITU, HIGH-GRADE WITH NECROSIS AND CALCIFICATIONS. SEE NOTE - LOBULAR CARCINOMA IN SITU Diagnosis Note DCIS measures 0.5 cm in greatest linear dimension. Dr. Saralyn Pilar reviewed the case and concurs with the diagnosis. A breast prognostic profile (ER, PR) is pending  and will be reported in an addendum. The [facility] was notified on 02/18/2020.   02/17/2020 Receptors her2   PROGNOSTIC INDICATORS Results: IMMUNOHISTOCHEMICAL AND MORPHOMETRIC ANALYSIS PERFORMED MANUALLY Estrogen Receptor: 95%, POSITIVE, MODERATE STAINING INTENSITY Progesterone Receptor: 70%, POSITIVE, MODERATE STAINING INTENSITY   02/26/2020 Initial Diagnosis   Malignant neoplasm of upper-outer quadrant of right breast in female, estrogen receptor positive ([facility])   03/08/2020 Genetic Testing   No pathogenic variants detected in [facility] Common Hereditary Cancers Panel. The Common Hereditary Cancers Panel offered by [facility] includes sequencing and/or deletion duplication testing of the following 48 genes: APC, ATM, AXIN2, BARD1, BMPR1A, BRCA1, BRCA2, BRIP1, CDH1, CDK4, CDKN2A (p14ARF), CDKN2A (p16INK4a), CHEK2, CTNNA1, DICER1, EPCAM (Deletion/duplication testing only), GREM1 (promoter region deletion/duplication testing only), KIT, MEN1, MLH1, MSH2, MSH3, MSH6, MUTYH, NBN, NF1, NHTL1, PALB2, PDGFRA, PMS2, POLD1, POLE, PTEN, RAD50, RAD51C, RAD51D, RNF43, SDHB, SDHC, SDHD, SMAD4, SMARCA4. STK11, TP53, TSC1, TSC2, and VHL.  The following genes were evaluated for sequence changes only: SDHA and HOXB13 c.251G>A variant only. The report date is March 08, 2020.   03/08/2020 Breast MRI   IMPRESSION: 1. Suspicious, linear enhancement involving the upper-outer quadrant of the right breast from mid to posterior depth. Susceptibility artifact from post-biopsy clip is seen along the posterior, inferior margin. The overall suspicious finding span approximately 4.2 x 2.8 cm in the AP by transverse dimensions. Recommendation is for MRI guided biopsy along the anterior margin of this enhancement, particularly at the irregular focus identified on series 10, image 81/144. 2. No suspicious MRI findings in the remainder of the right breast. 3. No MRI evidence of malignancy on the left. 4.  No suspicious lymphadenopathy.   03/20/2020 Pathology Results   Diagnosis Breast, right,  needle core biopsy, outer - LOBULAR CARCINOMA IN SITU. Microscopic Comment Immunohistochemistry for E-cadherin is negative. P63, Calponin and SMM-1 demonstrate the presence of myoepithelium. CKAE1AE3 is negative for an occult process. Results reported to [facility] on 03/23/2020. Dr. Melina Copa reviewed.   04/13/2020 Surgery   RIGHT MASTECTOMY WITH RIGHT AXILLARY SENTINEL NODE BIOPSY by Dr Ninfa Linden   04/13/2020 Pathology Results   FINAL MICROSCOPIC DIAGNOSIS:   A. BREAST, RIGHT, MASTECTOMY:  - Ductal carcinoma in situ (DCIS), high-grade with focal necrosis and  calcifications, spanning an area of 4.2 cm  - Lobular carcinoma in situ  - Resection margins are negative for DCIS  - Biopsy site changes  - See oncology table   B. LYMPH NODE, RIGHT AXILLARY, SENTINEL, EXCISION:  - Lymph node, negative for carcinoma (0/1)   C. LYMPH NODE, RIGHT AXILLARY, SENTINEL, EXCISION:  - Lymph node, negative for carcinoma (0/1)   D. LYMPH NODE, RIGHT AXILLARY, SENTINEL, EXCISION:  - Lymph node, negative for carcinoma (0/1)    04/13/2020 Cancer Staging   Staging form: Breast, AJCC 8th Edition - Pathologic stage from 04/13/2020: Stage 0 (pTis (DCIS), pN0, cM0, G3, ER+, PR+) - Signed by Alla Feeling, NP on 07/30/2020   05/29/2020 -  Anti-estrogen oral therapy   Tamoxifen 31m once daily    07/30/2020 Survivorship   SCP delivered by LCira Rue NP      CURRENT THERAPY: Tamoxifen starting 05/2020, switched to raloxifene 06/2021, stopped after 2 months. Pending low dose exemestane starting ~12/28/21  INTERVAL HISTORY: Ms. HThollreturns for follow-up as scheduled, last seen by Dr. FBurr Medico11/30/2022. She stopped raloxifene about a month or 2 after she started it due to vaginal discharge and increased sex drive. Side effects were "overwhelming." She also stopped all other meds except insulin and  vitamin B12. She has persistent sensitivity at the right chest wall, multiple bras have not helped.  She was prescribed gabapentin for diabetic neuropathy but does not take it.  She also thinks there are some lumps at her right chest wall which have been there a while.  She wonders if it is too late for reconstruction and would like to meet with plastic surgeon.  She has stable joint aches in her knees and shoulder, otherwise denies new or worsening joint pain.  Left mammogram due 02/2022.  All other systems were reviewed with the patient and are negative.  MEDICAL HISTORY:  Past Medical History:  Diagnosis Date   Anemia    as a teenager   Anxiety    Arthritis    knees   [place]   right breast DCIS, LCIS   Depression    Diabetes mellitus without complication ([place]    Type 2   Family history of breast cancer 02/28/2020   Headache    migraines   History of pneumonia    Numbness    "left foot"    Pneumonia    Poor circulation     SURGICAL HISTORY: Past Surgical History:  Procedure Laterality Date   ABCESS DRAINAGE     "after cesarean- in hospital for 3 months"   ABDOMINAL HYSTERECTOMY     BACK SURGERY     CESAREAN SECTION  07/11/1968   CHOLECYSTECTOMY     COLONOSCOPY     DILATION AND CURETTAGE OF UTERUS     EYE SURGERY Right    MENISCUS REPAIR Bilateral    SIMPLE MASTECTOMY WITH AXILLARY SENTINEL NODE BIOPSY Right 04/13/2020   Procedure: RIGHT MASTECTOMY  WITH RIGHT AXILLARY SENTINEL NODE BIOPSY;  Surgeon: Coralie Keens, MD;  Location: [facility];  Service: General;  Laterality: Right;   SPINE SURGERY     TOTAL KNEE ARTHROPLASTY Right 02/22/2021   Procedure: RIGHT TOTAL KNEE ARTHROPLASTY;  Surgeon: Leandrew Koyanagi, MD;  Location: [facility];  Service: Orthopedics;  Laterality: Right;    I have reviewed the social history and family history with the patient and they are unchanged from previous note.  ALLERGIES:  has No Known  Allergies.  MEDICATIONS:  Current Outpatient Medications  Medication Sig Dispense Refill   ondansetron (ZOFRAN) 4 MG tablet Take 1 tablet (4 mg total) by mouth every 8 (eight) hours as needed for nausea or vomiting. 40 tablet 0   tamoxifen (NOLVADEX) 10 MG tablet Take 0.5 tablets (5 mg total) by mouth daily. 30 tablet 0   acetaminophen (TYLENOL 8 HOUR) 650 MG CR tablet Take 1 tablet (650 mg total) by mouth every 8 (eight) hours as needed for pain. 60 tablet 0   amoxicillin (AMOXIL) 500 MG capsule Take 4 pills one hour prior to dental work 8 capsule 2   Cholecalciferol (DIALYVITE VITAMIN D 5000) 125 MCG (5000 UT) capsule Take 5,000 Units by mouth daily.     COVID-19 mRNA bivalent vaccine, Pfizer, (PFIZER COVID-19 VAC BIVALENT) injection Inject into the muscle. 0.3 mL 0   famotidine (PEPCID) 20 MG tablet Take 1 tablet (20 mg total) by mouth 2 (two) times daily. 60 tablet 0   gabapentin (NEURONTIN) 100 MG capsule Take 1 capsule (100 mg total) by mouth at bedtime. 30 capsule 1   ibuprofen (ADVIL) 200 MG tablet Take 200 mg by mouth every 6 (six) hours as needed for headache or cramping.     influenza vaccine adjuvanted (FLUAD) 0.5 ML injection Inject into the muscle. 0.5 mL 0   NOVOFINE 32G X 6 MM MISC AS DIRECTED ONCE A DAY WITH TRESIBA FLEXTOUCH     ONETOUCH DELICA LANCETS 68E MISC 2 (two) times daily. for testing  2   ONETOUCH VERIO test strip USE AS DIRECTED TO TEST BLOOD SUGAR TWICE A DAY  3   polyethylene glycol (MIRALAX / GLYCOLAX) 17 g packet Take 17 g by mouth daily as needed for moderate constipation.     predniSONE (STERAPRED UNI-PAK 21 TAB) 10 MG (21) TBPK tablet Take as directed 21 tablet 3   sitaGLIPtin (JANUVIA) 100 MG tablet Take 100 mg by mouth daily.     traMADol (ULTRAM) 50 MG tablet Take 1-2 tablets (50-100 mg total) by mouth every 12 (twelve) hours as needed. 40 tablet 0   TRESIBA FLEXTOUCH 100 UNIT/ML SOPN FlexTouch Pen Inject 30 Units into the skin daily.     No current  facility-administered medications for this visit.    PHYSICAL EXAMINATION: ECOG PERFORMANCE STATUS: 1 - Symptomatic but completely ambulatory  Vitals:   12/28/21 1210  BP: (!) 137/92  Pulse: 67  Resp: 16  Temp: 98 F (36.7 C)  SpO2: 96%   Filed Weights   12/28/21 1210  Weight: 161 lb 8 oz (73.3 kg)    GENERAL:alert, no distress and comfortable SKIN: no rash  EYES:  sclera clear NECK: Without mass LYMPH:  no palpable cervical or supraclavicular lymphadenopathy  LUNGS: clear with normal breathing effort HEART: regular rate & rhythm, no lower extremity edema ABDOMEN:abdomen soft, non-tender and normal bowel sounds NEURO: alert & oriented x 3 with fluent speech, no focal motor/sensory deficits Breast exam: S/p right mastectomy, incision completely  healed with mild scar tissue.  No palpable mass or nodularity along the right chest wall, left breast, or either axilla that I could appreciate.  No left nipple discharge or inversion.   LABORATORY DATA:  I have reviewed the data as listed    Latest Ref Rng & Units 12/28/2021   11:50 AM 06/09/2021    1:55 PM 02/23/2021    9:20 AM  CBC  WBC 4.0 - 10.5 K/uL 5.6  6.5  11.0   Hemoglobin 12.0 - 15.0 g/dL 13.5  13.1  12.3   Hematocrit 36.0 - 46.0 % 41.9  40.4  38.3   Platelets 150 - 400 K/uL 207  231  213         Latest Ref Rng & Units 12/28/2021   11:50 AM 06/09/2021    1:55 PM 02/23/2021    9:20 AM  CMP  Glucose 70 - 99 mg/dL 119  131  340   BUN 8 - 23 mg/dL _0 Creatinine 0.44 - 1.00 mg/dL 0.64  0.70  0.72   Sodium 135 - 145 mmol/L 138  142  133   Potassium 3.5 - 5.1 mmol/L 4.1  4.4  4.2   Chloride 98 - 111 mmol/L 106  108  102   CO2 22 - 32 mmol/L _1 Calcium 8.9 - 10.3 mg/dL 9.4  9.2  8.9   Total Protein 6.5 - 8.1 g/dL 7.4  7.4    Total Bilirubin 0.3 - 1.2 mg/dL 0.3  0.2    Alkaline Phos 38 - 126 U/L 75  88    AST 15 - 41 U/L 17  18    ALT 0 - 44 U/L 15  19        RADIOGRAPHIC STUDIES: I have  personally reviewed the radiological images as listed and agreed with the findings in the report. No results found.   ASSESSMENT & PLAN: Judith Chavez is a 76 y.o. female with      1. Right breast DCIS, High Grade, ER /PR+, (+) LCIS -Diagnosed 02/2020 with right breast with DCIS and components of necrosis and LCIS.  -S/p right mastectomy by Dr Ninfa Linden on 04/13/20.  -MSKCC tool showed a 10-15% risk of developing future breast cancer in the next 5-10 years.  Her risk would drop to 4-6% with RT or 5-8% with antiestrogen therapy alone.  With both adjuvant treatments her risk would decrease to 2-3%  -S/p mastectomy and no invasive breast cancer, radiation was not recommended -given her strongly positive ER/PR dcis/lcis, arthritis, osteopenia, and history of hysterectomy, she started antiestrogen therapy with Tamoxifen in 06/2020 for 5 years, main side effects were vaginal discharge and increased libido -survivorship visit 07/30/20 -Switched to raloxifene in 06/2021, stopped after 2 months due to vaginal discharge and increased libido. -Ms. Wailes is clinically doing well. She has what appears to be nerve pain at the right chest wall, will try course of low-dose gabapentin.  Physical exam is benign, labs are normal.  There is no clinical concern for recurrent or new breast cancer. -She would like to discuss right breast reconstructive surgery, I have referred her to plastics -We discussed restarting tamoxifen at low-dose for chemoprevention versus surveillance alone.  She agrees to try tamoxifen 5 mg p.o. once daily.   -Continue surveillance, left mammo 02/2022 -Phone follow-up in 1 month and routine surveillance visit in 6 months, or sooner if needed   2. Genetic Testing was negative  for pathogenetic mutations except SDHA and HOXB13 c.251G>A variant only    3. Anxiety/Depression  -She has experienced depression and anxiety in the past, on zoloft when she became our patient.  -Given Tamoxifen's drug  interaction with Zoloft, she was switched to Effexor 75 mg po once daily  -she still had anxiety/depression, and now moderate to severe hot flashes on tamoxifen,  -We previously increased her dose but she has subsequently stopped Effexor in the interim    4. Comorbidities: DM, Arthritis, osteopenia -She has arthritis in her knees, shoulders and back. She has had knee and back surgery. She is able to remain active with routine activities but will have some pain.  She plays tennis -stable on tamoxifen -Continue to F/u of PCP -She subsequently stopped most of her meds except insulin and a few others -Last DEXA 01/01/2021 showed osteopenia lowest T score -2.2, high FRAX score for hip fracture of 2.7% chance in the next 10 years.  Not ideal candidate for AI -Encouraged her to resume calcium and vitamin D and continue weightbearing exercise    Plan: -Restart tamoxifen 5 mg po once daily, phone f/up in 4 weeks to assess tolerance -Gabapentin 100 mg p.o. nightly for nerve pain at mastectomy site -Referral to plastic surgery to discuss right breast reconstruction -L screening mammo 02/2022 -Surveillance visit in 6 months, or sooner if needed   Orders Placed This Encounter  Procedures   MM 3D SCREEN BREAST UNI LEFT    Standing Status:   Future    Standing Expiration Date:   12/29/2022    Order Specific Question:   Reason for Exam (SYMPTOM  OR DIAGNOSIS REQUIRED)    Answer:   R breast DCIS/LCIS s/p mastectomy    Order Specific Question:   Preferred imaging location?    Answer:   [facility]   Ambulatory referral to Plastic Surgery    Referral Priority:   Routine    Referral Type:   Surgical    Referral Reason:   Specialty Services Required    Requested Specialty:   Plastic Surgery    Number of Visits Requested:   1   All questions were answered. The patient knows to call the clinic with any problems, questions or concerns. No barriers to learning was detected. I spent 20 minutes  counseling the patient face to face. The total time spent in the appointment was 30 minutes and more than 50% was on counseling and review of test results.     Alla Feeling, NP 12/28/21

## 2021-12-30 ENCOUNTER — Telehealth: Payer: Self-pay | Admitting: Hematology

## 2021-12-30 NOTE — Telephone Encounter (Signed)
Scheduled follow-up appointments per 6/20 los. Patient is aware.

## 2022-01-10 ENCOUNTER — Encounter: Payer: Self-pay | Admitting: Plastic Surgery

## 2022-01-10 ENCOUNTER — Ambulatory Visit (INDEPENDENT_AMBULATORY_CARE_PROVIDER_SITE_OTHER): Payer: Medicare Other | Admitting: Plastic Surgery

## 2022-01-10 VITALS — BP 148/85 | HR 74 | Ht 62.0 in | Wt 159.0 lb

## 2022-01-10 DIAGNOSIS — Z9011 Acquired absence of right breast and nipple: Secondary | ICD-10-CM | POA: Diagnosis not present

## 2022-01-10 DIAGNOSIS — Z853 Personal history of malignant neoplasm of breast: Secondary | ICD-10-CM

## 2022-01-12 NOTE — Progress Notes (Signed)
Referring Provider Mayra Neer, MD [place] [employer] [place],  [place] [address]   CC:  Right breast reconstruction   Judith Chavez is an 76 y.o. female.  HPI: Patient is a 76 year old interested in right breast reconstruction.  She has a B cup on her natural breast side.  She has completed right mastectomy with Dr. Rush Farmer. Her last mammogram was Feb 11, 2022 on the left and was normal.  She has not had radiation or chemotherapy.    No Known Allergies  Outpatient Encounter Medications as of 01/10/2022  Medication Sig Note   acetaminophen (TYLENOL 8 HOUR) 650 MG CR tablet Take 1 tablet (650 mg total) by mouth every 8 (eight) hours as needed for pain.    Cholecalciferol (DIALYVITE VITAMIN D 5000) 125 MCG (5000 UT) capsule Take 5,000 Units by mouth daily.    COVID-19 mRNA bivalent vaccine, Pfizer, (PFIZER COVID-19 VAC BIVALENT) injection Inject into the muscle.    ibuprofen (ADVIL) 200 MG tablet Take 200 mg by mouth every 6 (six) hours as needed for headache or cramping.    influenza vaccine adjuvanted (FLUAD) 0.5 ML injection Inject into the muscle.    NOVOFINE 32G X 6 MM MISC AS DIRECTED ONCE A DAY WITH TRESIBA FLEXTOUCH    ONETOUCH DELICA LANCETS 47Q MISC 2 (two) times daily. for testing    ONETOUCH VERIO test strip USE AS DIRECTED TO TEST BLOOD SUGAR TWICE A DAY    polyethylene glycol (MIRALAX / GLYCOLAX) 17 g packet Take 17 g by mouth daily as needed for moderate constipation.    sitaGLIPtin (JANUVIA) 100 MG tablet Take 100 mg by mouth daily.    traMADol (ULTRAM) 50 MG tablet Take 1-2 tablets (50-100 mg total) by mouth every 12 (twelve) hours as needed.    TRESIBA FLEXTOUCH 100 UNIT/ML SOPN FlexTouch Pen Inject 30 Units into the skin daily.    [DISCONTINUED] amoxicillin (AMOXIL) 500 MG capsule Take 4 pills one hour prior to dental work    [DISCONTINUED] famotidine (PEPCID) 20 MG tablet Take 1 tablet (20 mg total) by mouth 2 (two) times daily.    [DISCONTINUED]  gabapentin (NEURONTIN) 100 MG capsule Take 1 capsule (100 mg total) by mouth at bedtime.    [DISCONTINUED] ondansetron (ZOFRAN) 4 MG tablet Take 1 tablet (4 mg total) by mouth every 8 (eight) hours as needed for nausea or vomiting. 02/05/2021: Hasnt started   [DISCONTINUED] predniSONE (STERAPRED UNI-PAK 21 TAB) 10 MG (21) TBPK tablet Take as directed    [DISCONTINUED] tamoxifen (NOLVADEX) 10 MG tablet Take 0.5 tablets (5 mg total) by mouth daily.    No facility-administered encounter medications on file as of 01/10/2022.     Past Medical History:  Diagnosis Date   Anemia    as a teenager   Anxiety    Arthritis    knees   Cancer ([place]) 03/2020   right breast DCIS, LCIS   Depression    Diabetes mellitus without complication ([place])    Type 2   Family history of breast cancer 02/28/2020   Headache    migraines   History of pneumonia    Numbness    "left foot"    Pneumonia    Poor circulation     Past Surgical History:  Procedure Laterality Date   ABCESS DRAINAGE     "after cesarean- in hospital for 3 months"   ABDOMINAL HYSTERECTOMY     BACK SURGERY     CESAREAN SECTION  07/11/1968   CHOLECYSTECTOMY  COLONOSCOPY     DILATION AND CURETTAGE OF UTERUS     EYE SURGERY Right    MENISCUS REPAIR Bilateral    SIMPLE MASTECTOMY WITH AXILLARY SENTINEL NODE BIOPSY Right 04/13/2020   Procedure: RIGHT MASTECTOMY WITH RIGHT AXILLARY SENTINEL NODE BIOPSY;  Surgeon: Coralie Keens, MD;  Location: [facility];  Service: General;  Laterality: Right;   SPINE SURGERY     TOTAL KNEE ARTHROPLASTY Right 02/22/2021   Procedure: RIGHT TOTAL KNEE ARTHROPLASTY;  Surgeon: Leandrew Koyanagi, MD;  Location: [facility];  Service: Orthopedics;  Laterality: Right;    Family History  Problem Relation Age of Onset   Breast cancer Cousin        maternal; dx and d. <50   Breast cancer Cousin        maternal; dx and d. <50   Breast cancer Cousin        maternal; dx and d. <50   Breast cancer  Cousin        maternal; dx and d. <50   Cancer Paternal Aunt        unknown type cancer dx 80s    Social History   Social History Narrative   Not on file     Review of Systems General: Denies fevers, chills, weight loss CV: Denies chest pain, shortness of breath, palpitations   Physical Exam    01/10/2022    9:56 AM 12/28/2021   12:10 PM 06/09/2021    2:16 PM  Vitals with BMI  Height '5\' 2"'$   '5\' 1"'$   Weight 159 lbs 161 lbs 8 oz 162 lbs 8 oz  BMI 38.75  64.33  Systolic 295 188 416  Diastolic 85 92 78  Pulse 74 67 79    General:  No acute distress,  Alert and oriented, Non-Toxic, Normal speech and affect Breast:  Left breast SN to nipple 22.5, BW 16, nipple to fold 6 cm  Assessment/Plan Good candidate right breast reconstruction with tissue expander and flex HD.  Risks and benefits discussed and the use of Flex HD discussed.      Lennice Sites 01/12/2022, 8:03 PM

## 2022-01-14 DIAGNOSIS — S46811A Strain of other muscles, fascia and tendons at shoulder and upper arm level, right arm, initial encounter: Secondary | ICD-10-CM | POA: Diagnosis not present

## 2022-01-17 ENCOUNTER — Telehealth: Payer: Self-pay

## 2022-01-17 NOTE — Telephone Encounter (Signed)
Called patient requesting the phone # to [place]. She will try to upload photo via mychart or come by the office.

## 2022-01-25 ENCOUNTER — Ambulatory Visit: Payer: Medicare Other | Admitting: Nurse Practitioner

## 2022-02-02 NOTE — Progress Notes (Unsigned)
[facility]   [phone] [phone]   Clinic Follow up Note   Patient Care Team: Lupita Raider, MD as PCP - General (Family Medicine) Abigail Miyamoto, MD as Consulting Physician (General Surgery) Malachy Mood, MD as Consulting Physician (Hematology) Antony Blackbird, MD as Consulting Physician (Radiation Oncology) Pershing Proud, RN as Oncology Nurse Navigator Donnelly Angelica, RN as Oncology Nurse Navigator Pollyann Samples, NP as Nurse Practitioner (Nurse Practitioner) 02/03/2022  I connected with Judith Chavez on 02/03/22 at 10:30 AM EDT by telephone visit and verified that I am speaking with the correct person using two identifiers.   I discussed the limitations, risks, security and privacy concerns of performing an evaluation and management service by telemedicine and the availability of in-person appointments. I also discussed with the patient that there may be a patient responsible charge related to this service. The patient expressed understanding and agreed to proceed.   Other persons participating in the visit and their role in the encounter: none   Patient's location: home Provider's location: [facility] office   CHIEF COMPLAINT: Med toxicity check, history of right breast DCIS and LCIS  SUMMARY OF ONCOLOGIC HISTORY: Oncology History Overview Note  Cancer Staging Malignant neoplasm of upper-outer quadrant of right breast in female, estrogen receptor positive (HCC) Staging form: Breast, AJCC 8th Edition - Clinical stage from 02/17/2020: Stage 0 (cTis (DCIS), cN0, cM0, ER+, PR+) - Signed by Loa Socks, NP on 02/26/2020 - Pathologic stage from 04/13/2020: Stage 0 (pTis (DCIS), pN0, cM0, G3, ER+, PR+) - Signed by Pollyann Samples, NP on 07/30/2020    Malignant neoplasm of upper-outer quadrant of right breast in female, estrogen receptor positive (HCC)  02/13/2020 Mammogram   IMPRESSION: Highly suspicious calcifications within the  upper-outer quadrant of the RIGHT breast, measuring 2.8 cm extent, for which stereotactic biopsy is recommended.   02/17/2020 Cancer Staging   Staging form: Breast, AJCC 8th Edition - Clinical stage from 02/17/2020: Stage 0 (cTis (DCIS), cN0, cM0, ER+, PR+) - Signed by Loa Socks, NP on 02/26/2020   02/17/2020 Initial Biopsy   Diagnosis Breast, right, needle core biopsy, lateral - DUCTAL CARCINOMA IN SITU, HIGH-GRADE WITH NECROSIS AND CALCIFICATIONS. SEE NOTE - LOBULAR CARCINOMA IN SITU Diagnosis Note DCIS measures 0.5 cm in greatest linear dimension. Dr. Luisa Hart reviewed the case and concurs with the diagnosis. A breast prognostic profile (ER, PR) is pending and will be reported in an addendum. [facility] was notified on 02/18/2020.   02/17/2020 Receptors her2   PROGNOSTIC INDICATORS Results: IMMUNOHISTOCHEMICAL AND MORPHOMETRIC ANALYSIS PERFORMED MANUALLY Estrogen Receptor: 95%, POSITIVE, MODERATE STAINING INTENSITY Progesterone Receptor: 70%, POSITIVE, MODERATE STAINING INTENSITY   02/26/2020 Initial Diagnosis   Malignant neoplasm of upper-outer quadrant of right breast in female, estrogen receptor positive (HCC)   03/08/2020 Genetic Testing   No pathogenic variants detected in [facility] Common Hereditary Cancers Panel. The Common Hereditary Cancers Panel offered by [facility] includes sequencing and/or deletion duplication testing of the following 48 genes: APC, ATM, AXIN2, BARD1, BMPR1A, BRCA1, BRCA2, BRIP1, CDH1, CDK4, CDKN2A (p14ARF), CDKN2A (p16INK4a), CHEK2, CTNNA1, DICER1, EPCAM (Deletion/duplication testing only), GREM1 (promoter region deletion/duplication testing only), KIT, MEN1, MLH1, MSH2, MSH3, MSH6, MUTYH, NBN, NF1, NHTL1, PALB2, PDGFRA, PMS2, POLD1, POLE, PTEN, RAD50, RAD51C, RAD51D, RNF43, SDHB, SDHC, SDHD, SMAD4, SMARCA4. STK11, TP53, TSC1, TSC2, and VHL.  The following genes were evaluated for sequence changes only: SDHA and HOXB13 c.251G>A  variant only. The report date is March 08, 2020.  03/08/2020 Breast MRI   IMPRESSION: 1. Suspicious, linear enhancement involving the upper-outer quadrant of the right breast from mid to posterior depth. Susceptibility artifact from post-biopsy clip is seen along the posterior, inferior margin. The overall suspicious finding span approximately 4.2 x 2.8 cm in the AP by transverse dimensions. Recommendation is for MRI guided biopsy along the anterior margin of this enhancement, particularly at the irregular focus identified on series 10, image 81/144. 2. No suspicious MRI findings in the remainder of the right breast. 3. No MRI evidence of malignancy on the left. 4. No suspicious lymphadenopathy.   03/20/2020 Pathology Results   Diagnosis Breast, right, needle core biopsy, outer - LOBULAR CARCINOMA IN SITU. Microscopic Comment Immunohistochemistry for E-cadherin is negative. P63, Calponin and SMM-1 demonstrate the presence of myoepithelium. CKAE1AE3 is negative for an occult process. Results reported to [facility] on 03/23/2020. Dr. Melina Copa reviewed.   04/13/2020 Surgery   RIGHT MASTECTOMY WITH RIGHT AXILLARY SENTINEL NODE BIOPSY by Dr Ninfa Linden   04/13/2020 Pathology Results   FINAL MICROSCOPIC DIAGNOSIS:   A. BREAST, RIGHT, MASTECTOMY:  - Ductal carcinoma in situ (DCIS), high-grade with focal necrosis and  calcifications, spanning an area of 4.2 cm  - Lobular carcinoma in situ  - Resection margins are negative for DCIS  - Biopsy site changes  - See oncology table   B. LYMPH NODE, RIGHT AXILLARY, SENTINEL, EXCISION:  - Lymph node, negative for carcinoma (0/1)   C. LYMPH NODE, RIGHT AXILLARY, SENTINEL, EXCISION:  - Lymph node, negative for carcinoma (0/1)   D. LYMPH NODE, RIGHT AXILLARY, SENTINEL, EXCISION:  - Lymph node, negative for carcinoma (0/1)    04/13/2020 Cancer Staging   Staging form: Breast, AJCC 8th Edition - Pathologic stage from  04/13/2020: Stage 0 (pTis (DCIS), pN0, cM0, G3, ER+, PR+) - Signed by Alla Feeling, NP on 07/30/2020   05/29/2020 -  Anti-estrogen oral therapy   Tamoxifen $RemoveBe'20mg'NVRimIJpR$  once daily    07/30/2020 Survivorship   SCP delivered by Cira Rue, NP     CURRENT THERAPY: Tamoxifen starting 05/2020, switched to raloxifene 06/2021, stopped after 2 months. Tried low dose exemestane 12/28/21 for couple weeks but stopped due to SE's. Now on surveillance alone  INTERVAL HISTORY: Judith Chavez presents by phone to follow-up anti-estrogen. Last seen by me 12/28/21. She tried low dose AI but subsequently developed severe neck muscle spasm and leg aches and had difficulty moving her neck and walking.  Went to urgent care who diagnosed her with a strain of the right trapezius muscle.  A muscle relaxer helped.  She went on a cruise last week and stopped all medication except insulin and vitamins.  She prefers a more natural approach.  She feels much better off AI, more energy, no joint pain.  Is not interested in considering other medication.  She did not take gabapentin for mastectomy site pain, now uses vitamin E.  She also met with plastic surgeon and plans to have another discussion soon.   MEDICAL HISTORY:  Past Medical History:  Diagnosis Date   Anemia    as a teenager   Anxiety    Arthritis    knees   Cancer ([place]) 03/2020   right breast DCIS, LCIS   Depression    Diabetes mellitus without complication ([place])    Type 2   Family history of breast cancer 02/28/2020   Headache    migraines   History of pneumonia    Numbness    "left foot"  Pneumonia    Poor circulation     SURGICAL HISTORY: Past Surgical History:  Procedure Laterality Date   ABCESS DRAINAGE     "after cesarean- in hospital for 3 months"   ABDOMINAL HYSTERECTOMY     BACK SURGERY     CESAREAN SECTION  07/11/1968   CHOLECYSTECTOMY     COLONOSCOPY     DILATION AND CURETTAGE OF UTERUS     EYE SURGERY Right    MENISCUS REPAIR Bilateral     SIMPLE MASTECTOMY WITH AXILLARY SENTINEL NODE BIOPSY Right 04/13/2020   Procedure: RIGHT MASTECTOMY WITH RIGHT AXILLARY SENTINEL NODE BIOPSY;  Surgeon: Coralie Keens, MD;  Location: [facility];  Service: General;  Laterality: Right;   SPINE SURGERY     TOTAL KNEE ARTHROPLASTY Right 02/22/2021   Procedure: RIGHT TOTAL KNEE ARTHROPLASTY;  Surgeon: Leandrew Koyanagi, MD;  Location: [facility];  Service: Orthopedics;  Laterality: Right;    I have reviewed the social history and family history with the patient and they are unchanged from previous note.  ALLERGIES:  has No Known Allergies.  MEDICATIONS:  Current Outpatient Medications  Medication Sig Dispense Refill   acetaminophen (TYLENOL 8 HOUR) 650 MG CR tablet Take 1 tablet (650 mg total) by mouth every 8 (eight) hours as needed for pain. 60 tablet 0   Cholecalciferol (DIALYVITE VITAMIN D 5000) 125 MCG (5000 UT) capsule Take 5,000 Units by mouth daily.     COVID-19 mRNA bivalent vaccine, Pfizer, (PFIZER COVID-19 VAC BIVALENT) injection Inject into the muscle. 0.3 mL 0   ibuprofen (ADVIL) 200 MG tablet Take 200 mg by mouth every 6 (six) hours as needed for headache or cramping.     influenza vaccine adjuvanted (FLUAD) 0.5 ML injection Inject into the muscle. 0.5 mL 0   NOVOFINE 32G X 6 MM MISC AS DIRECTED ONCE A DAY WITH TRESIBA FLEXTOUCH     ONETOUCH DELICA LANCETS 53M MISC 2 (two) times daily. for testing  2   ONETOUCH VERIO test strip USE AS DIRECTED TO TEST BLOOD SUGAR TWICE A DAY  3   polyethylene glycol (MIRALAX / GLYCOLAX) 17 g packet Take 17 g by mouth daily as needed for moderate constipation.     sitaGLIPtin (JANUVIA) 100 MG tablet Take 100 mg by mouth daily.     traMADol (ULTRAM) 50 MG tablet Take 1-2 tablets (50-100 mg total) by mouth every 12 (twelve) hours as needed. 40 tablet 0   TRESIBA FLEXTOUCH 100 UNIT/ML SOPN FlexTouch Pen Inject 30 Units into the skin daily.     No current facility-administered medications  for this visit.    PHYSICAL EXAMINATION:  There were no vitals filed for this visit. There were no vitals filed for this visit.  Patient appears well over the phone.  Voice is strong, speech is clear.  No cough or conversational dyspnea.  LABORATORY DATA:  I have reviewed the data as listed    Latest Ref Rng & Units 12/28/2021   11:50 AM 06/09/2021    1:55 PM 02/23/2021    9:20 AM  CBC  WBC 4.0 - 10.5 K/uL 5.6  6.5  11.0   Hemoglobin 12.0 - 15.0 g/dL 13.5  13.1  12.3   Hematocrit 36.0 - 46.0 % 41.9  40.4  38.3   Platelets 150 - 400 K/uL 207  231  213         Latest Ref Rng & Units 12/28/2021   11:50 AM 06/09/2021    1:55 PM 02/23/2021  9:20 AM  CMP  Glucose 70 - 99 mg/dL 119  131  340   BUN 8 - 23 mg/dL $Remove'13  19  11   'yTphoys$ Creatinine 0.44 - 1.00 mg/dL 0.64  0.70  0.72   Sodium 135 - 145 mmol/L 138  142  133   Potassium 3.5 - 5.1 mmol/L 4.1  4.4  4.2   Chloride 98 - 111 mmol/L 106  108  102   CO2 22 - 32 mmol/L $RemoveB'26  24  22   'kFNOMdwN$ Calcium 8.9 - 10.3 mg/dL 9.4  9.2  8.9   Total Protein 6.5 - 8.1 g/dL 7.4  7.4    Total Bilirubin 0.3 - 1.2 mg/dL 0.3  0.2    Alkaline Phos 38 - 126 U/L 75  88    AST 15 - 41 U/L 17  18    ALT 0 - 44 U/L 15  19        RADIOGRAPHIC STUDIES: I have personally reviewed the radiological images as listed and agreed with the findings in the report. No results found.   ASSESSMENT & PLAN: Judith Chavez is a 76 y.o. female with      1. Right breast DCIS, High Grade, ER /PR+, (+) LCIS -Diagnosed 02/2020 with right breast with DCIS and components of necrosis and LCIS.  -S/p right mastectomy by Dr Ninfa Linden on 04/13/20.  -[facility] tool showed a 10-15% risk of developing future breast cancer in the next 5-10 years.  Her risk would drop to 4-6% with RT or 5-8% with antiestrogen therapy alone.  With both adjuvant treatments her risk would decrease to 2-3%  -S/p mastectomy and no invasive breast cancer, radiation was not recommended -given her strongly positive ER/PR  dcis/lcis, arthritis, osteopenia, and history of hysterectomy, she started antiestrogen therapy with Tamoxifen in 06/2020 with a goal of 5 years.  She developed vaginal discharge, headaches, and insomnia and was switched to raloxifene in 06/2021.  She stopped this after 2 months due to vaginal discharge and increased libido. -She tried low-dose exemestane but stopped after a few weeks due to severe joint and muscle pain/spasm.  She has recovered well off antiestrogen medication -She took some form of chemoprevention antiestrogen for close to 2 years, she likely achieved some benefit.  Given her poor tolerance and preference for a more natural approach, we will move to surveillance alone.  -Next mammo 02/11/2022 as scheduled -Follow-up in December as scheduled, or sooner if needed  2. Genetic Testing was negative for pathogenetic mutations except SDHA and HOXB13 c.251G>A variant only    3. Anxiety/Depression  -She has experienced depression and anxiety in the past, on zoloft when she became our patient.  -Given Tamoxifen's drug interaction with Zoloft, she was switched to Effexor 75 mg po once daily  -she still had anxiety/depression, and now moderate to severe hot flashes on tamoxifen,  -We previously increased her dose but she has subsequently stopped Effexor in the interim    4. Comorbidities: DM, Arthritis, osteopenia -She has arthritis in her knees, shoulders and back. She has had knee and back surgery. She is able to remain active with routine activities but will have some pain.  She plays tennis -Continue to F/u of PCP -She subsequently stopped most of her meds except insulin and a few others -Last DEXA 01/01/2021 showed osteopenia lowest T score -2.2, high FRAX score for hip fracture of 2.7% chance in the next 10 years.  -Encouraged her to resume calcium and vitamin D and continue weightbearing  exercise   Plan -We will stop antiestrogen due to poor tolerance and patient  preference -Proceed with surveillance alone -follow up with plastic surgery, Dr. Lennice Sites -Next mammo 02/11/2022 as scheduled -F/up 06/29/22 as scheduled for surveillance visit  I discussed the assessment and treatment plan with the patient. The patient was provided an opportunity to ask questions and all were answered. The patient agreed with the plan and demonstrated an understanding of the instructions.   The patient was advised to call back or seek an in-person evaluation if the symptoms worsen or if the condition fails to improve as anticipated. No barriers to learning were detected. I spent 7 minutes counseling the patient non-face-to-face.      Alla Feeling, NP 02/03/22

## 2022-02-03 ENCOUNTER — Inpatient Hospital Stay: Payer: Medicare Other | Attending: Nurse Practitioner | Admitting: Nurse Practitioner

## 2022-02-03 ENCOUNTER — Encounter: Payer: Self-pay | Admitting: Nurse Practitioner

## 2022-02-03 DIAGNOSIS — C50411 Malignant neoplasm of upper-outer quadrant of right female breast: Secondary | ICD-10-CM

## 2022-02-03 DIAGNOSIS — Z170 Estrogen receptor positive status [ER+]: Secondary | ICD-10-CM

## 2022-02-09 ENCOUNTER — Ambulatory Visit: Payer: Self-pay

## 2022-02-09 ENCOUNTER — Encounter: Payer: Self-pay | Admitting: Orthopaedic Surgery

## 2022-02-09 ENCOUNTER — Ambulatory Visit (INDEPENDENT_AMBULATORY_CARE_PROVIDER_SITE_OTHER): Payer: Medicare Other | Admitting: Orthopaedic Surgery

## 2022-02-09 DIAGNOSIS — M1712 Unilateral primary osteoarthritis, left knee: Secondary | ICD-10-CM | POA: Diagnosis not present

## 2022-02-09 DIAGNOSIS — Z96651 Presence of right artificial knee joint: Secondary | ICD-10-CM | POA: Diagnosis not present

## 2022-02-09 MED ORDER — LIDOCAINE HCL 1 % IJ SOLN
2.0000 mL | INTRAMUSCULAR | Status: AC | PRN
  Administered 2022-02-09: 2 mL

## 2022-02-09 MED ORDER — BUPIVACAINE HCL 0.25 % IJ SOLN
2.0000 mL | INTRAMUSCULAR | Status: AC | PRN
  Administered 2022-02-09: 2 mL via INTRA_ARTICULAR

## 2022-02-09 MED ORDER — METHYLPREDNISOLONE ACETATE 40 MG/ML IJ SUSP
40.0000 mg | INTRAMUSCULAR | Status: AC | PRN
  Administered 2022-02-09: 40 mg via INTRA_ARTICULAR

## 2022-02-09 NOTE — Progress Notes (Signed)
Office Visit Note   Patient: Judith Chavez           Date of Birth: [birth year]           MRN: [id number] Visit Date: 02/09/2022              Requested by: Mayra Neer, MD [address] [place],  [place] [address] PCP: Mayra Neer, MD   Assessment & Plan: Visit Diagnoses:  1. Status post total right knee replacement   2. Unilateral primary osteoarthritis, left knee     Plan: Impression is status post right total knee replacement and left knee osteoarthritis.  In regards to the right knee, she is doing well.  She will follow-up with [place] in 1 year.  Dental prophylaxis reinforced.  In regards to the left knee, we discussed repeat cortisone injection today for which she is agreeable to.  Follow-up as needed.  Follow-Up Instructions: Return in about 1 year (around 02/10/2023).   Orders:  Orders Placed This Encounter  Procedures   Large Joint Inj: L knee   XR Knee 1-2 Views Right   No orders of the defined types were placed in this encounter.     Procedures: Large Joint Inj: L knee on 02/09/2022 11:09 AM Indications: pain Details: 22 G needle, anterolateral approach Medications: 2 mL lidocaine 1 %; 2 mL bupivacaine 0.25 %; 40 mg methylPREDNISolone acetate 40 MG/ML      Clinical Data: No additional findings.   Subjective: Chief Complaint  Patient presents with   Right Knee - Follow-up    Right total knee arthroplasty 02/22/2021    HPI patient is a pleasant 76 year old female who comes in today 1 year status post right total knee replacement 02/22/2021.  She is doing well.  She still notes some slight discomfort around the proximal fibula which is very infrequent.  She is unsure what seems to aggravate this.  Other issue she brings up today is recurrent left knee pain.  History of osteoarthritis there.  She has had cortisone injections in the past which have helped.  She is asking for repeat cortisone injection today.  Review of Systems as detailed in HPI.   All others reviewed and are negative.   Objective: Vital Signs: There were no vitals taken for this visit.  Physical Exam well-developed well-nourished female no acute distress.  Alert and oriented x3.  Ortho Exam right knee exam reveals a fully healed surgical scar without complication.  Range of motion 0 to 115 degrees.  She is stable to valgus varus stress.  No joint line tenderness.  She does have mild tenderness to the proximal fibula.  She is neurovascular intact distally.  Left knee exam shows a small effusion.  Range of motion 0 to 120 degrees.  Medial joint line tenderness.  She is neurovascular intact distally.  Specialty Comments:  No specialty comments available.  Imaging: XR Knee 1-2 Views Right  Result Date: 02/09/2022 X-rays demonstrate a well-seated prosthesis without complication    PMFS History: Patient Active Problem List   Diagnosis Date Noted   Status post total right knee replacement 02/22/2021   History of right breast cancer 04/13/2020   Genetic testing 03/10/2020   Family history of breast cancer 02/28/2020   Malignant neoplasm of upper-outer quadrant of right breast in female, estrogen receptor positive ([place]) 02/26/2020   Primary osteoarthritis of right knee 03/20/2019   Primary osteoarthritis of left knee 03/20/2019   Primary osteoarthritis of both knees 04/24/2017  Spondylolisthesis at L3-L4 level 06/01/2015   Past Medical History:  Diagnosis Date   Anemia    as a teenager   Anxiety    Arthritis    knees   Cancer ([place]) 03/2020   right breast DCIS, LCIS   Depression    Diabetes mellitus without complication ([place])    Type 2   Family history of breast cancer 02/28/2020   Headache    migraines   History of pneumonia    Numbness    "left foot"    Pneumonia    Poor circulation     Family History  Problem Relation Age of Onset   Breast cancer Cousin        maternal; dx and d. <50   Breast cancer Cousin        maternal; dx and d. <50    Breast cancer Cousin        maternal; dx and d. <50   Breast cancer Cousin        maternal; dx and d. <50   Cancer Paternal Aunt        unknown type cancer dx 56s    Past Surgical History:  Procedure Laterality Date   ABCESS DRAINAGE     "after cesarean- in hospital for 3 months"   ABDOMINAL HYSTERECTOMY     BACK SURGERY     CESAREAN SECTION  07/11/1968   CHOLECYSTECTOMY     COLONOSCOPY     DILATION AND CURETTAGE OF UTERUS     EYE SURGERY Right    MENISCUS REPAIR Bilateral    SIMPLE MASTECTOMY WITH AXILLARY SENTINEL NODE BIOPSY Right 04/13/2020   Procedure: RIGHT MASTECTOMY WITH RIGHT AXILLARY SENTINEL NODE BIOPSY;  Surgeon: Coralie Keens, MD;  Location: [facility];  Service: General;  Laterality: Right;   [place] Right 02/22/2021   Procedure: RIGHT TOTAL KNEE ARTHROPLASTY;  Surgeon: Leandrew Koyanagi, MD;  Location: [facility];  Service: Orthopedics;  Laterality: Right;   Social History   Occupational History   Occupation: retired   Tobacco Use   Smoking status: Former    Packs/day: 0.50    Years: 10.00    Total pack years: 5.00    Types: Cigarettes    Quit date: 07/11/1986    Years since quitting: 35.6   Smokeless tobacco: Never  Vaping Use   Vaping Use: Never used  Substance and Sexual Activity   Alcohol use: Yes    Comment: occassionally   Drug use: No   Sexual activity: Not on file

## 2022-02-11 ENCOUNTER — Ambulatory Visit: Payer: Medicare Other

## 2022-02-17 ENCOUNTER — Ambulatory Visit
Admission: RE | Admit: 2022-02-17 | Discharge: 2022-02-17 | Disposition: A | Discharge status: Home / Self Care | Payer: Medicare Other | Source: Ambulatory Visit | Attending: Nurse Practitioner | Admitting: Nurse Practitioner

## 2022-02-17 ENCOUNTER — Telehealth: Payer: Self-pay | Admitting: *Deleted

## 2022-02-17 DIAGNOSIS — Z1231 Encounter for screening mammogram for malignant neoplasm of breast: Secondary | ICD-10-CM | POA: Diagnosis not present

## 2022-02-17 NOTE — Telephone Encounter (Signed)
LVM requesting patient call me to get her surgery scheduled

## 2022-03-21 DIAGNOSIS — Z23 Encounter for immunization: Secondary | ICD-10-CM | POA: Diagnosis not present

## 2022-03-21 DIAGNOSIS — R252 Cramp and spasm: Secondary | ICD-10-CM | POA: Diagnosis not present

## 2022-03-21 DIAGNOSIS — E1169 Type 2 diabetes mellitus with other specified complication: Secondary | ICD-10-CM | POA: Diagnosis not present

## 2022-03-21 DIAGNOSIS — F3341 Major depressive disorder, recurrent, in partial remission: Secondary | ICD-10-CM | POA: Diagnosis not present

## 2022-04-19 ENCOUNTER — Telehealth: Payer: Self-pay

## 2022-04-19 NOTE — Telephone Encounter (Signed)
Called patient, advised need to cancel surgery on 11/30 with Dr. Erin Hearing. Will need to schedule consultation with Dr. Marla Roe or Dr. Lovena Le at no charge. Patient indicated she is not doing well due to diabetes. Isaias Sakai, with [facility] schedulers is aware and cancelled surgery.

## 2022-05-04 ENCOUNTER — Other Ambulatory Visit (HOSPITAL_BASED_OUTPATIENT_CLINIC_OR_DEPARTMENT_OTHER): Payer: Self-pay

## 2022-05-04 MED ORDER — OZEMPIC (2 MG/DOSE) 8 MG/3ML ~~LOC~~ SOPN
2.0000 mg | PEN_INJECTOR | SUBCUTANEOUS | 1 refills | Status: DC
Start: 2022-05-04 — End: 2022-06-22
  Filled 2022-05-04: qty 3, 28d supply, fill #0
  Filled 2022-05-25: qty 3, 28d supply, fill #1

## 2022-05-25 ENCOUNTER — Other Ambulatory Visit (HOSPITAL_BASED_OUTPATIENT_CLINIC_OR_DEPARTMENT_OTHER): Payer: Self-pay

## 2022-05-31 ENCOUNTER — Encounter: Payer: Medicare Other | Admitting: Physician Assistant

## 2022-06-01 ENCOUNTER — Encounter: Payer: Medicare Other | Admitting: Physician Assistant

## 2022-06-09 ENCOUNTER — Ambulatory Visit (HOSPITAL_BASED_OUTPATIENT_CLINIC_OR_DEPARTMENT_OTHER): Admit: 2022-06-09 | Payer: Medicare Other | Admitting: Plastic Surgery

## 2022-06-09 ENCOUNTER — Encounter (HOSPITAL_BASED_OUTPATIENT_CLINIC_OR_DEPARTMENT_OTHER): Payer: Self-pay

## 2022-06-09 SURGERY — BREAST RECONSTRUCTION WITH PLACEMENT OF TISSUE EXPANDER AND FLEX HD (ACELLULAR HYDRATED DERMIS)
Anesthesia: General | Site: Breast | Laterality: Right

## 2022-06-19 DIAGNOSIS — J069 Acute upper respiratory infection, unspecified: Secondary | ICD-10-CM | POA: Diagnosis not present

## 2022-06-22 ENCOUNTER — Other Ambulatory Visit (HOSPITAL_BASED_OUTPATIENT_CLINIC_OR_DEPARTMENT_OTHER): Payer: Self-pay

## 2022-06-22 DIAGNOSIS — J019 Acute sinusitis, unspecified: Secondary | ICD-10-CM | POA: Diagnosis not present

## 2022-06-22 DIAGNOSIS — E1169 Type 2 diabetes mellitus with other specified complication: Secondary | ICD-10-CM | POA: Diagnosis not present

## 2022-06-22 DIAGNOSIS — A6000 Herpesviral infection of urogenital system, unspecified: Secondary | ICD-10-CM | POA: Diagnosis not present

## 2022-06-22 DIAGNOSIS — M549 Dorsalgia, unspecified: Secondary | ICD-10-CM | POA: Diagnosis not present

## 2022-06-22 DIAGNOSIS — M1990 Unspecified osteoarthritis, unspecified site: Secondary | ICD-10-CM | POA: Diagnosis not present

## 2022-06-22 DIAGNOSIS — Z0000 Encounter for general adult medical examination without abnormal findings: Secondary | ICD-10-CM | POA: Diagnosis not present

## 2022-06-22 DIAGNOSIS — F3342 Major depressive disorder, recurrent, in full remission: Secondary | ICD-10-CM | POA: Diagnosis not present

## 2022-06-22 DIAGNOSIS — Z853 Personal history of malignant neoplasm of breast: Secondary | ICD-10-CM | POA: Diagnosis not present

## 2022-06-22 DIAGNOSIS — G4700 Insomnia, unspecified: Secondary | ICD-10-CM | POA: Diagnosis not present

## 2022-06-22 DIAGNOSIS — M8580 Other specified disorders of bone density and structure, unspecified site: Secondary | ICD-10-CM | POA: Diagnosis not present

## 2022-06-22 MED ORDER — AZITHROMYCIN 250 MG PO TABS
ORAL_TABLET | ORAL | 0 refills | Status: DC
  Filled 2022-06-22: qty 6, 5d supply, fill #0

## 2022-06-22 MED ORDER — HYDROCODONE BIT-HOMATROP MBR 5-1.5 MG/5ML PO SOLN
5.0000 mL | Freq: Four times a day (QID) | ORAL | 0 refills | Status: AC | PRN
  Filled 2022-06-22: qty 120, 6d supply, fill #0

## 2022-06-22 MED ORDER — OZEMPIC (2 MG/DOSE) 8 MG/3ML ~~LOC~~ SOPN
2.0000 mg | PEN_INJECTOR | SUBCUTANEOUS | 1 refills | Status: DC
  Filled 2022-06-22: qty 3, 28d supply, fill #0
  Filled 2022-07-22: qty 3, 28d supply, fill #1
  Filled 2022-08-15: qty 3, 28d supply, fill #2
  Filled 2022-09-14: qty 3, 28d supply, fill #3
  Filled 2022-10-11: qty 3, 28d supply, fill #4
  Filled 2022-11-09: qty 3, 28d supply, fill #5

## 2022-06-22 MED ORDER — BENZONATATE 100 MG PO CAPS
100.0000 mg | ORAL_CAPSULE | Freq: Three times a day (TID) | ORAL | 0 refills | Status: DC | PRN
  Filled 2022-06-22: qty 100, 17d supply, fill #0

## 2022-06-28 ENCOUNTER — Other Ambulatory Visit: Payer: Self-pay

## 2022-06-28 DIAGNOSIS — Z170 Estrogen receptor positive status [ER+]: Secondary | ICD-10-CM

## 2022-06-29 ENCOUNTER — Inpatient Hospital Stay: Payer: BLUE CROSS/BLUE SHIELD | Admitting: Hematology

## 2022-06-29 ENCOUNTER — Inpatient Hospital Stay: Payer: BLUE CROSS/BLUE SHIELD

## 2022-07-17 NOTE — Assessment & Plan Note (Signed)
-  diagnosed in 02/2020 -s/p lumpectomy  --I started her on Antiestrogen therapy with Tamoxifen '20mg'$  once daily in 05/2020, but she could not tolerate well. She tried raloxifene and low dose Tamoxifen, but still did not tolerate well, and stopped in 01/2022 -continue breast cancer surveillance   -f/u with [place] as needed

## 2022-07-18 ENCOUNTER — Inpatient Hospital Stay: Payer: Medicare Other | Attending: Nurse Practitioner

## 2022-07-18 ENCOUNTER — Encounter: Payer: Self-pay | Admitting: Hematology

## 2022-07-18 ENCOUNTER — Other Ambulatory Visit: Payer: Self-pay

## 2022-07-18 ENCOUNTER — Inpatient Hospital Stay (HOSPITAL_BASED_OUTPATIENT_CLINIC_OR_DEPARTMENT_OTHER): Payer: Medicare Other | Admitting: Hematology

## 2022-07-18 VITALS — BP 148/98 | HR 67 | Temp 98.7°F | Resp 18 | Ht 62.0 in | Wt 154.6 lb

## 2022-07-18 DIAGNOSIS — D0511 Intraductal carcinoma in situ of right breast: Secondary | ICD-10-CM | POA: Diagnosis not present

## 2022-07-18 DIAGNOSIS — Z170 Estrogen receptor positive status [ER+]: Secondary | ICD-10-CM

## 2022-07-18 DIAGNOSIS — Z8701 Personal history of pneumonia (recurrent): Secondary | ICD-10-CM | POA: Diagnosis not present

## 2022-07-18 DIAGNOSIS — E2839 Other primary ovarian failure: Secondary | ICD-10-CM

## 2022-07-18 DIAGNOSIS — Z1231 Encounter for screening mammogram for malignant neoplasm of breast: Secondary | ICD-10-CM | POA: Diagnosis not present

## 2022-07-18 DIAGNOSIS — Z86000 Personal history of in-situ neoplasm of breast: Secondary | ICD-10-CM | POA: Diagnosis not present

## 2022-07-18 DIAGNOSIS — Z803 Family history of malignant neoplasm of breast: Secondary | ICD-10-CM | POA: Insufficient documentation

## 2022-07-18 LAB — CBC WITH DIFFERENTIAL (CANCER CENTER ONLY)
Abs Immature Granulocytes: 0.02 10*3/uL (ref 0.00–0.07)
Basophils Absolute: 0.1 10*3/uL (ref 0.0–0.1)
Basophils Relative: 1 %
Eosinophils Absolute: 0.2 10*3/uL (ref 0.0–0.5)
Eosinophils Relative: 4 %
HCT: 40.3 % (ref 36.0–46.0)
Hemoglobin: 13.1 g/dL (ref 12.0–15.0)
Immature Granulocytes: 0 %
Lymphocytes Relative: 39 %
Lymphs Abs: 2.2 10*3/uL (ref 0.7–4.0)
MCH: 27.5 pg (ref 26.0–34.0)
MCHC: 32.5 g/dL (ref 30.0–36.0)
MCV: 84.5 fL (ref 80.0–100.0)
Monocytes Absolute: 0.5 10*3/uL (ref 0.1–1.0)
Monocytes Relative: 8 %
Neutro Abs: 2.7 10*3/uL (ref 1.7–7.7)
Neutrophils Relative %: 48 %
Platelet Count: 182 10*3/uL (ref 150–400)
RBC: 4.77 MIL/uL (ref 3.87–5.11)
RDW: 13.4 % (ref 11.5–15.5)
WBC Count: 5.7 10*3/uL (ref 4.0–10.5)
nRBC: 0 % (ref 0.0–0.2)

## 2022-07-18 LAB — CMP (CANCER CENTER ONLY)
ALT: 17 U/L (ref 10–47)
AST: 21 U/L (ref 15–41)
Albumin: 4 g/dL (ref 3.5–5.0)
Alkaline Phosphatase: 68 U/L (ref 38–126)
Anion gap: 5 (ref 5–15)
BUN: 13 mg/dL (ref 8–23)
CO2: 26 mmol/L (ref 22–32)
Calcium: 9.3 mg/dL (ref 8.9–10.3)
Chloride: 109 mmol/L (ref 98–111)
Creatinine: 0.61 mg/dL (ref 0.60–1.20)
GFR, Estimated: >60 mL/min (ref >60)
Glucose, Bld: 102 mg/dL — ABNORMAL HIGH (ref 70–99)
Potassium: 4.2 mmol/L (ref 3.5–5.1)
Sodium: 140 mmol/L (ref 135–145)
Total Bilirubin: 0.3 mg/dL (ref 0.3–1.2)
Total Protein: 6.5 g/dL (ref 6.5–8.1)

## 2022-07-18 NOTE — Progress Notes (Signed)
[facility]   Telephone:([phone]) [phone] Fax:([phone]) [phone]   Clinic Follow up Note   Patient Care Team: Mayra Neer, MD as PCP - General (Family Medicine) Coralie Keens, MD as Consulting Physician (General Surgery) Truitt Merle, MD as Consulting Physician (Hematology) Gery Pray, MD as Consulting Physician (Radiation Oncology) Mauro Kaufmann, RN as Oncology Nurse Navigator Rockwell Germany, RN as Oncology Nurse Navigator Alla Feeling, NP as Nurse Practitioner (Nurse Practitioner)  Date of Service:  07/18/2022  CHIEF COMPLAINT: f/u of history of right breast DCIS and LCIS    CURRENT THERAPY: surveillance    ASSESSMENT:  Judith Chavez is a 77 y.o. female with   Ductal carcinoma in situ (DCIS) of right breast -diagnosed in 02/2020 -s/p lumpectomy  --I started her on Antiestrogen therapy with Tamoxifen '20mg'$  once daily in 05/2020, but she could not tolerate well. She tried raloxifene and low dose Tamoxifen, but still did not tolerate well, and stopped in 01/2022 -continue breast cancer surveillance   -f/u with [place] as needed     PLAN: -Lab reviewed - Good  - Order Bone Density to be done in 02/2023 - Order left screening mammogram to be done in 02/2023 -f/u as needed   SUMMARY OF ONCOLOGIC HISTORY: Oncology History Overview Note  Cancer Staging Malignant neoplasm of upper-outer quadrant of right breast in female, estrogen receptor positive ([place]) Staging form: Breast, AJCC 8th Edition - Clinical stage from 02/17/2020: Stage 0 (cTis (DCIS), cN0, cM0, ER+, PR+) - Signed by Gardenia Phlegm, NP on 02/26/2020 - Pathologic stage from 04/13/2020: Stage 0 (pTis (DCIS), pN0, cM0, G3, ER+, PR+) - Signed by Alla Feeling, NP on 07/30/2020    Ductal carcinoma in situ (DCIS) of right breast  02/13/2020 Mammogram   IMPRESSION: Highly suspicious calcifications within the upper-outer quadrant of the RIGHT breast, measuring 2.8 cm extent, for which  stereotactic biopsy is recommended.   02/17/2020 Cancer Staging   Staging form: Breast, AJCC 8th Edition - Clinical stage from 02/17/2020: Stage 0 (cTis (DCIS), cN0, cM0, ER+, PR+) - Signed by Gardenia Phlegm, NP on 02/26/2020   02/17/2020 Initial Biopsy   Diagnosis Breast, right, needle core biopsy, lateral - DUCTAL CARCINOMA IN SITU, HIGH-GRADE WITH NECROSIS AND CALCIFICATIONS. SEE NOTE - LOBULAR CARCINOMA IN SITU Diagnosis Note DCIS measures 0.5 cm in greatest linear dimension. Dr. Saralyn Pilar reviewed the case and concurs with the diagnosis. A breast prognostic profile (ER, PR) is pending and will be reported in an addendum. [facility] was notified on 02/18/2020.   02/17/2020 Receptors her2   PROGNOSTIC INDICATORS Results: IMMUNOHISTOCHEMICAL AND MORPHOMETRIC ANALYSIS PERFORMED MANUALLY Estrogen Receptor: 95%, POSITIVE, MODERATE STAINING INTENSITY Progesterone Receptor: 70%, POSITIVE, MODERATE STAINING INTENSITY   02/26/2020 Initial Diagnosis   Malignant neoplasm of upper-outer quadrant of right breast in female, estrogen receptor positive ([place])   03/08/2020 Genetic Testing   No pathogenic variants detected in [facility] Common Hereditary Cancers Panel. The Common Hereditary Cancers Panel offered by [facility] includes sequencing and/or deletion duplication testing of the following 48 genes: APC, ATM, AXIN2, BARD1, BMPR1A, BRCA1, BRCA2, BRIP1, CDH1, CDK4, CDKN2A (p14ARF), CDKN2A (p16INK4a), CHEK2, CTNNA1, DICER1, EPCAM (Deletion/duplication testing only), GREM1 (promoter region deletion/duplication testing only), KIT, MEN1, MLH1, MSH2, MSH3, MSH6, MUTYH, NBN, NF1, NHTL1, PALB2, PDGFRA, PMS2, POLD1, POLE, PTEN, RAD50, RAD51C, RAD51D, RNF43, SDHB, SDHC, SDHD, SMAD4, SMARCA4. STK11, TP53, TSC1, TSC2, and VHL.  The following genes were evaluated for sequence changes only: SDHA and HOXB13 c.251G>A variant only. The  report date is March 08, 2020.   03/08/2020 Breast MRI    IMPRESSION: 1. Suspicious, linear enhancement involving the upper-outer quadrant of the right breast from mid to posterior depth. Susceptibility artifact from post-biopsy clip is seen along the posterior, inferior margin. The overall suspicious finding span approximately 4.2 x 2.8 cm in the AP by transverse dimensions. Recommendation is for MRI guided biopsy along the anterior margin of this enhancement, particularly at the irregular focus identified on series 10, image 81/144. 2. No suspicious MRI findings in the remainder of the right breast. 3. No MRI evidence of malignancy on the left. 4. No suspicious lymphadenopathy.   03/20/2020 Pathology Results   Diagnosis Breast, right, needle core biopsy, outer - LOBULAR CARCINOMA IN SITU. Microscopic Comment Immunohistochemistry for E-cadherin is negative. P63, Calponin and SMM-1 demonstrate the presence of myoepithelium. CKAE1AE3 is negative for an occult process. Results reported to [facility] on 03/23/2020. Dr. Melina Copa reviewed.   04/13/2020 Surgery   RIGHT MASTECTOMY WITH RIGHT AXILLARY SENTINEL NODE BIOPSY by Dr Ninfa Linden   04/13/2020 Pathology Results   FINAL MICROSCOPIC DIAGNOSIS:   A. BREAST, RIGHT, MASTECTOMY:  - Ductal carcinoma in situ (DCIS), high-grade with focal necrosis and  calcifications, spanning an area of 4.2 cm  - Lobular carcinoma in situ  - Resection margins are negative for DCIS  - Biopsy site changes  - See oncology table   B. LYMPH NODE, RIGHT AXILLARY, SENTINEL, EXCISION:  - Lymph node, negative for carcinoma (0/1)   C. LYMPH NODE, RIGHT AXILLARY, SENTINEL, EXCISION:  - Lymph node, negative for carcinoma (0/1)   D. LYMPH NODE, RIGHT AXILLARY, SENTINEL, EXCISION:  - Lymph node, negative for carcinoma (0/1)    04/13/2020 Cancer Staging   Staging form: Breast, AJCC 8th Edition - Pathologic stage from 04/13/2020: Stage 0 (pTis (DCIS), pN0, cM0, G3, ER+, PR+) - Signed by Alla Feeling,  NP on 07/30/2020   05/29/2020 -  Anti-estrogen oral therapy   Tamoxifen '20mg'$  once daily    07/30/2020 Survivorship   SCP delivered by Cira Rue, NP       INTERVAL HISTORY:  Judith Chavez is here for a follow up of history of right breast DCIS and LCIS   She was last seen by NP Lacie on 02/03/22 She presents to the clinic alone. Pt states that she still has some sensation from the breast surgery.Pt had a concern about misinformation" In her words".   All other systems were reviewed with the patient and are negative.  MEDICAL HISTORY:  Past Medical History:  Diagnosis Date   Anemia    as a teenager   Anxiety    Arthritis    knees   Cancer ([place]) 03/2020   right breast DCIS, LCIS   Depression    Diabetes mellitus without complication ([place])    Type 2   Family history of breast cancer 02/28/2020   Headache    migraines   History of pneumonia    Numbness    "left foot"    Pneumonia    Poor circulation     SURGICAL HISTORY: Past Surgical History:  Procedure Laterality Date   ABCESS DRAINAGE     "after cesarean- in hospital for 3 months"   ABDOMINAL HYSTERECTOMY     BACK SURGERY     CESAREAN SECTION  07/11/1968   CHOLECYSTECTOMY     COLONOSCOPY     DILATION AND CURETTAGE OF UTERUS     EYE SURGERY Right  MASTECTOMY     MENISCUS REPAIR Bilateral    SIMPLE MASTECTOMY WITH AXILLARY SENTINEL NODE BIOPSY Right 04/13/2020   Procedure: RIGHT MASTECTOMY WITH RIGHT AXILLARY SENTINEL NODE BIOPSY;  Surgeon: Coralie Keens, MD;  Location: [facility];  Service: General;  Laterality: Right;   SPINE SURGERY     TOTAL KNEE ARTHROPLASTY Right 02/22/2021   Procedure: RIGHT TOTAL KNEE ARTHROPLASTY;  Surgeon: Leandrew Koyanagi, MD;  Location: [facility];  Service: Orthopedics;  Laterality: Right;    I have reviewed the social history and family history with the patient and they are unchanged from previous note.  ALLERGIES:  has No Known Allergies.  MEDICATIONS:  Current  Outpatient Medications  Medication Sig Dispense Refill   acetaminophen (TYLENOL 8 HOUR) 650 MG CR tablet Take 1 tablet (650 mg total) by mouth every 8 (eight) hours as needed for pain. 60 tablet 0   azithromycin (ZITHROMAX) 250 MG tablet Take 2 tablets by mouth on day 1, then 1 tablet daily for the next 4 days. 6 tablet 0   benzonatate (TESSALON) 100 MG capsule Take 1-2 capsules (100-200 mg total) by mouth 3 (three) times daily as needed for cough. 100 capsule 0   Cholecalciferol (DIALYVITE VITAMIN D 5000) 125 MCG (5000 UT) capsule Take 5,000 Units by mouth daily.     COVID-19 mRNA bivalent vaccine, Pfizer, (PFIZER COVID-19 VAC BIVALENT) injection Inject into the muscle. 0.3 mL 0   HYDROcodone bit-homatropine (HYCODAN) 5-1.5 MG/5ML syrup Take 5 mLs by mouth every 6 (six) hours as needed 1 hour prior to meals. 120 mL 0   ibuprofen (ADVIL) 200 MG tablet Take 200 mg by mouth every 6 (six) hours as needed for headache or cramping.     influenza vaccine adjuvanted (FLUAD) 0.5 ML injection Inject into the muscle. 0.5 mL 0   NOVOFINE 32G X 6 MM MISC AS DIRECTED ONCE A DAY WITH TRESIBA FLEXTOUCH     ONETOUCH DELICA LANCETS 35T MISC 2 (two) times daily. for testing  2   ONETOUCH VERIO test strip USE AS DIRECTED TO TEST BLOOD SUGAR TWICE A DAY  3   Semaglutide, 2 MG/DOSE, (OZEMPIC, 2 MG/DOSE,) 8 MG/3ML SOPN Inject 2 mg into the skin once a week. 9 mL 1   sitaGLIPtin (JANUVIA) 100 MG tablet Take 100 mg by mouth daily.     traMADol (ULTRAM) 50 MG tablet Take 1-2 tablets (50-100 mg total) by mouth every 12 (twelve) hours as needed. 40 tablet 0   TRESIBA FLEXTOUCH 100 UNIT/ML SOPN FlexTouch Pen Inject 30 Units into the skin daily.     No current facility-administered medications for this visit.    PHYSICAL EXAMINATION: ECOG PERFORMANCE STATUS: 0 - Asymptomatic  Vitals:   07/18/22 1310  BP: (!) 148/98  Pulse: 67  Resp: 18  Temp: 98.7 F (37.1 C)  SpO2: 97%   Wt Readings from Last 3 Encounters:   07/18/22 154 lb 9.6 oz (70.1 kg)  01/10/22 159 lb (72.1 kg)  12/28/21 161 lb 8 oz (73.3 kg)    GENERAL:alert, no distress and comfortable SKIN: skin color, texture, turgor are normal, no rashes or significant lesions EYES: normal, Conjunctiva are pink and non-injected, sclera clear NECK: (-) supple, thyroid normal size, non-tender, without nodularity LYMPH:(-)  no palpable lymphadenopathy in the cervical, axillary  LUNGS: clear to auscultation and percussion with normal breathing effort HEART: regular rate & rhythm and no murmurs and no lower extremity edema ABDOMEN:abdomen soft, non-tender and normal bowel sounds Musculoskeletal:no cyanosis  of digits and no clubbing  NEURO: alert & oriented x 3 with fluent speech, no focal motor/sensory deficits BREAST:RT: s/p right breast mastectomy  with surgical scar tissue, left breast is normal, No palpable mass. Breast exam benign.       LABORATORY DATA:  I have reviewed the data as listed    Latest Ref Rng & Units 07/18/2022   12:48 PM 12/28/2021   11:50 AM 06/09/2021    1:55 PM  CBC  WBC 4.0 - 10.5 K/uL 5.7  5.6  6.5   Hemoglobin 12.0 - 15.0 g/dL 13.1  13.5  13.1   Hematocrit 36.0 - 46.0 % 40.3  41.9  40.4   Platelets 150 - 400 K/uL 182  207  231         Latest Ref Rng & Units 07/18/2022   12:48 PM 12/28/2021   11:50 AM 06/09/2021    1:55 PM  CMP  Glucose 70 - 99 mg/dL 102  119  131   BUN 8 - 23 mg/dL '13  13  19   '$ Creatinine 0.60 - 1.20 mg/dL 0.61  0.64  0.70   Sodium 135 - 145 mmol/L 140  138  142   Potassium 3.5 - 5.1 mmol/L 4.2  4.1  4.4   Chloride 98 - 111 mmol/L 109  106  108   CO2 22 - 32 mmol/L '26  26  24   '$ Calcium 8.9 - 10.3 mg/dL 9.3  9.4  9.2   Total Protein 6.5 - 8.1 g/dL 6.5  7.4  7.4   Total Bilirubin 0.3 - 1.2 mg/dL 0.3  0.3  0.2   Alkaline Phos 38 - 126 U/L 68  75  88   AST 15 - 41 U/L '21  17  18   '$ ALT 10 - 47 U/L '17  15  19       '$ RADIOGRAPHIC STUDIES: I have personally reviewed the radiological images  as listed and agreed with the findings in the report. No results found.    Orders Placed This Encounter  Procedures   MM Digital Screening Unilat L    Standing Status:   Future    Standing Expiration Date:   07/18/2023    Order Specific Question:   Reason for Exam (SYMPTOM  OR DIAGNOSIS REQUIRED)    Answer:   screening    Order Specific Question:   Preferred imaging location?    Answer:   [facility]   DG Bone Density    Standing Status:   Future    Standing Expiration Date:   07/18/2023    Order Specific Question:   Reason for Exam (SYMPTOM  OR DIAGNOSIS REQUIRED)    Answer:   screening    Order Specific Question:   Preferred imaging location?    Answer:   [facility] only)   All questions were answered. The patient knows to call the clinic with any problems, questions or concerns. No barriers to learning was detected. The total time spent in the appointment was 20 minutes.     Truitt Merle, MD 07/18/2022   Felicity Coyer, CMA, am acting as scribe for Truitt Merle, MD.   I have reviewed the above documentation for accuracy and completeness, and I agree with the above.

## 2022-08-11 ENCOUNTER — Other Ambulatory Visit (HOSPITAL_COMMUNITY): Payer: Self-pay

## 2022-10-12 ENCOUNTER — Other Ambulatory Visit (INDEPENDENT_AMBULATORY_CARE_PROVIDER_SITE_OTHER): Payer: Medicare Other

## 2022-10-12 ENCOUNTER — Ambulatory Visit (INDEPENDENT_AMBULATORY_CARE_PROVIDER_SITE_OTHER): Payer: Medicare Other | Admitting: Orthopaedic Surgery

## 2022-10-12 DIAGNOSIS — Z96651 Presence of right artificial knee joint: Secondary | ICD-10-CM

## 2022-10-12 DIAGNOSIS — M1712 Unilateral primary osteoarthritis, left knee: Secondary | ICD-10-CM | POA: Diagnosis not present

## 2022-10-12 MED ORDER — BUPIVACAINE HCL 0.5 % IJ SOLN
2.0000 mL | INTRAMUSCULAR | Status: AC | PRN
  Administered 2022-10-12: 2 mL via INTRA_ARTICULAR

## 2022-10-12 MED ORDER — LIDOCAINE HCL 1 % IJ SOLN
2.0000 mL | INTRAMUSCULAR | Status: AC | PRN
  Administered 2022-10-12: 2 mL

## 2022-10-12 MED ORDER — METHYLPREDNISOLONE ACETATE 40 MG/ML IJ SUSP
40.0000 mg | INTRAMUSCULAR | Status: AC | PRN
Start: 2022-10-12 — End: 2022-10-12
  Administered 2022-10-12: 40 mg via INTRA_ARTICULAR

## 2022-10-12 NOTE — Progress Notes (Signed)
Office Visit Note   Patient: Judith Chavez           Date of Birth: [birth year]           MRN: [id number] Visit Date: 10/12/2022              Requested by: Mayra Neer, MD [address] [place],  [place] [address] PCP: Mayra Neer, MD   Assessment & Plan: Visit Diagnoses:  1. Hx of total knee replacement, right   2. Unilateral primary osteoarthritis, left knee     Plan: Impression is left knee osteoarthritis and status post right total knee replacement.  She is doing well from the knee replacement and has overall had a significant improvement in quality life and pain relief.  She reports some lateral knee pain that I think is related to scar tissue.  For the left knee steroid injection was administered today.  She can follow-up with [place] as needed.  Continue with over-the-counter medications as needed.  Follow-Up Instructions: No follow-ups on file.   Orders:  Orders Placed This Encounter  Procedures   Large Joint Inj: L knee   XR KNEE 3 VIEW RIGHT   XR KNEE 3 VIEW LEFT   No orders of the defined types were placed in this encounter.     Procedures: Large Joint Inj: L knee on 10/12/2022 12:00 PM Details: 22 G needle Medications: 2 mL bupivacaine 0.5 %; 2 mL lidocaine 1 %; 40 mg methylPREDNISolone acetate 40 MG/ML Outcome: tolerated well, no immediate complications Patient was prepped and draped in the usual sterile fashion.       Clinical Data: No additional findings.   Subjective: Chief Complaint  Patient presents with   Right Knee - Pain   Left Knee - Pain    HPI  Judith Chavez comes in today for follow-up of left knee OA.  Previous cortisone injection has worn off and she is requesting another 1.  She is currently taking Aleve for the pain.  Review of Systems  Constitutional: Negative.   HENT: Negative.    Eyes: Negative.   Respiratory: Negative.    Cardiovascular: Negative.   Endocrine: Negative.   Musculoskeletal: Negative.    Neurological: Negative.   Hematological: Negative.   Psychiatric/Behavioral: Negative.    All other systems reviewed and are negative.    Objective: Vital Signs: There were no vitals taken for this visit.  Physical Exam Vitals and nursing note reviewed.  Constitutional:      Appearance: She is well-developed.  HENT:     Head: Normocephalic and atraumatic.  Pulmonary:     Effort: Pulmonary effort is normal.  Abdominal:     Palpations: Abdomen is soft.  Musculoskeletal:     Cervical back: Neck supple.  Skin:    General: Skin is warm.     Capillary Refill: Capillary refill takes less than 2 seconds.  Neurological:     Mental Status: She is alert and oriented to person, place, and time.  Psychiatric:        Behavior: Behavior normal.        Thought Content: Thought content normal.        Judgment: Judgment normal.     Ortho Exam  Exam of the left knee shows trace effusion.  Lateral joint line tenderness.  Pain and crepitus throughout range of motion.  Specialty Comments:  No specialty comments available.  Imaging: XR KNEE 3 VIEW LEFT  Result Date: 10/12/2022 Moderately severe left knee tricompartmental OA  XR KNEE 3 VIEW RIGHT  Result Date: 10/12/2022 Stable rightvtotal knee replacement without complication.    PMFS History: Patient Active Problem List   Diagnosis Date Noted   Status post total right knee replacement 02/22/2021   History of right breast cancer 04/13/2020   Genetic testing 03/10/2020   Family history of breast cancer 02/28/2020   Ductal carcinoma in situ (DCIS) of right breast 02/26/2020   Primary osteoarthritis of right knee 03/20/2019   Primary osteoarthritis of left knee 03/20/2019   Primary osteoarthritis of both knees 04/24/2017   Spondylolisthesis at L3-L4 level 06/01/2015   Past Medical History:  Diagnosis Date   Anemia    as a teenager   Anxiety    Arthritis    knees   Cancer ([place]) 03/2020   right breast DCIS, LCIS    Depression    Diabetes mellitus without complication ([facility] [facility])    Type 2   Family history of breast cancer 02/28/2020   Headache    migraines   History of pneumonia    Numbness    "left foot"    Pneumonia    Poor circulation     Family History  Problem Relation Age of Onset   Breast cancer Cousin        maternal; dx and d. <50   Breast cancer Cousin        maternal; dx and d. <50   Breast cancer Cousin        maternal; dx and d. <50   Breast cancer Cousin        maternal; dx and d. <50   Cancer Paternal Aunt        unknown type cancer dx 77s    Past Surgical History:  Procedure Laterality Date   ABCESS DRAINAGE     "after cesarean- in hospital for 3 months"   ABDOMINAL HYSTERECTOMY     BACK SURGERY     CESAREAN SECTION  07/11/1968   CHOLECYSTECTOMY     COLONOSCOPY     DILATION AND CURETTAGE OF UTERUS     EYE SURGERY Right    MASTECTOMY     MENISCUS REPAIR Bilateral    SIMPLE MASTECTOMY WITH AXILLARY SENTINEL NODE BIOPSY Right 04/13/2020   Procedure: RIGHT MASTECTOMY WITH RIGHT AXILLARY SENTINEL NODE BIOPSY;  Surgeon: Coralie Keens, MD;  Location: [facility];  Service: General;  Laterality: Right;   SPINE SURGERY     TOTAL KNEE ARTHROPLASTY Right 02/22/2021   Procedure: RIGHT TOTAL KNEE ARTHROPLASTY;  Surgeon: Leandrew Koyanagi, MD;  Location: [facility];  Service: Orthopedics;  Laterality: Right;   Social History   Occupational History   Occupation: retired   Tobacco Use   Smoking status: Former    Packs/day: 0.50    Years: 10.00    Additional pack years: 0.00    Total pack years: 5.00    Types: Cigarettes    Quit date: 07/11/1986    Years since quitting: 36.2   Smokeless tobacco: Never  Vaping Use   Vaping Use: Never used  Substance and Sexual Activity   Alcohol use: Yes    Comment: occassionally   Drug use: No   Sexual activity: Not on file

## 2022-11-30 DIAGNOSIS — G4700 Insomnia, unspecified: Secondary | ICD-10-CM | POA: Diagnosis not present

## 2022-11-30 DIAGNOSIS — E1169 Type 2 diabetes mellitus with other specified complication: Secondary | ICD-10-CM | POA: Diagnosis not present

## 2022-12-17 ENCOUNTER — Other Ambulatory Visit (HOSPITAL_BASED_OUTPATIENT_CLINIC_OR_DEPARTMENT_OTHER): Payer: Self-pay

## 2022-12-19 ENCOUNTER — Other Ambulatory Visit (HOSPITAL_BASED_OUTPATIENT_CLINIC_OR_DEPARTMENT_OTHER): Payer: Self-pay

## 2022-12-19 MED ORDER — OZEMPIC (2 MG/DOSE) 8 MG/3ML ~~LOC~~ SOPN
2.0000 mg | PEN_INJECTOR | SUBCUTANEOUS | 1 refills | Status: DC
  Filled 2022-12-19: qty 3, 28d supply, fill #0
  Filled 2023-01-13: qty 3, 28d supply, fill #1

## 2023-01-09 ENCOUNTER — Ambulatory Visit
Admission: RE | Admit: 2023-01-09 | Discharge: 2023-01-09 | Disposition: A | Discharge status: Home / Self Care | Payer: Medicare Other | Source: Ambulatory Visit | Attending: Hematology | Admitting: Hematology

## 2023-01-09 ENCOUNTER — Encounter: Payer: Self-pay | Admitting: Hematology

## 2023-01-09 DIAGNOSIS — Z90722 Acquired absence of ovaries, bilateral: Secondary | ICD-10-CM | POA: Diagnosis not present

## 2023-01-09 DIAGNOSIS — Z853 Personal history of malignant neoplasm of breast: Secondary | ICD-10-CM | POA: Diagnosis not present

## 2023-01-09 DIAGNOSIS — E2839 Other primary ovarian failure: Secondary | ICD-10-CM

## 2023-01-09 DIAGNOSIS — E349 Endocrine disorder, unspecified: Secondary | ICD-10-CM | POA: Diagnosis not present

## 2023-01-09 DIAGNOSIS — M8588 Other specified disorders of bone density and structure, other site: Secondary | ICD-10-CM | POA: Diagnosis not present

## 2023-01-13 DIAGNOSIS — R051 Acute cough: Secondary | ICD-10-CM | POA: Diagnosis not present

## 2023-01-15 ENCOUNTER — Other Ambulatory Visit: Payer: Self-pay | Admitting: Hematology

## 2023-01-16 ENCOUNTER — Other Ambulatory Visit: Payer: Self-pay

## 2023-01-16 ENCOUNTER — Telehealth: Payer: Self-pay

## 2023-01-16 NOTE — Telephone Encounter (Addendum)
Called patient and relayed message below as per Dr. Malachy Mood. Patient voice full understanding. Faxed a copy of last office note and Dexa result notes to PCP Dr. Lupita Raider. Received fax confirmation of receipt.     ----- Message from Malachy Mood, MD sent at 01/15/2023  1:03 PM EDT ----- Please let pt know her DEXA result, she has osteoporosis and may benefit from medical treatment. I have released her from our clinic. Please let her f/u with her PCP on this, and fax the report and my last office note to her PCP, thanks   Malachy Mood

## 2023-02-07 ENCOUNTER — Other Ambulatory Visit (HOSPITAL_BASED_OUTPATIENT_CLINIC_OR_DEPARTMENT_OTHER): Payer: Self-pay

## 2023-02-08 ENCOUNTER — Other Ambulatory Visit (HOSPITAL_BASED_OUTPATIENT_CLINIC_OR_DEPARTMENT_OTHER): Payer: Self-pay

## 2023-02-08 MED ORDER — OZEMPIC (2 MG/DOSE) 8 MG/3ML ~~LOC~~ SOPN
2.0000 mg | PEN_INJECTOR | SUBCUTANEOUS | 1 refills | Status: DC
  Filled 2023-02-08: qty 3, 28d supply, fill #0
  Filled 2023-03-09: qty 3, 28d supply, fill #1

## 2023-02-10 DIAGNOSIS — M810 Age-related osteoporosis without current pathological fracture: Secondary | ICD-10-CM | POA: Diagnosis not present

## 2023-02-14 DIAGNOSIS — M810 Age-related osteoporosis without current pathological fracture: Secondary | ICD-10-CM | POA: Diagnosis not present

## 2023-02-14 DIAGNOSIS — E113293 Type 2 diabetes mellitus with mild nonproliferative diabetic retinopathy without macular edema, bilateral: Secondary | ICD-10-CM | POA: Diagnosis not present

## 2023-02-14 DIAGNOSIS — E1169 Type 2 diabetes mellitus with other specified complication: Secondary | ICD-10-CM | POA: Diagnosis not present

## 2023-02-14 DIAGNOSIS — F3341 Major depressive disorder, recurrent, in partial remission: Secondary | ICD-10-CM | POA: Diagnosis not present

## 2023-02-14 DIAGNOSIS — M25572 Pain in left ankle and joints of left foot: Secondary | ICD-10-CM | POA: Diagnosis not present

## 2023-02-20 ENCOUNTER — Other Ambulatory Visit: Payer: Self-pay | Admitting: Hematology

## 2023-02-20 ENCOUNTER — Ambulatory Visit
Admission: RE | Admit: 2023-02-20 | Discharge: 2023-02-20 | Disposition: A | Discharge status: Home / Self Care | Payer: Medicare Other | Source: Ambulatory Visit | Attending: Hematology | Admitting: Hematology

## 2023-02-20 DIAGNOSIS — Z1231 Encounter for screening mammogram for malignant neoplasm of breast: Secondary | ICD-10-CM

## 2023-02-20 DIAGNOSIS — H35342 Macular cyst, hole, or pseudohole, left eye: Secondary | ICD-10-CM | POA: Diagnosis not present

## 2023-02-20 DIAGNOSIS — H2513 Age-related nuclear cataract, bilateral: Secondary | ICD-10-CM | POA: Diagnosis not present

## 2023-02-20 DIAGNOSIS — E2839 Other primary ovarian failure: Secondary | ICD-10-CM

## 2023-02-20 DIAGNOSIS — D0511 Intraductal carcinoma in situ of right breast: Secondary | ICD-10-CM

## 2023-02-20 DIAGNOSIS — E119 Type 2 diabetes mellitus without complications: Secondary | ICD-10-CM | POA: Diagnosis not present

## 2023-03-06 DIAGNOSIS — H25813 Combined forms of age-related cataract, bilateral: Secondary | ICD-10-CM | POA: Diagnosis not present

## 2023-03-06 DIAGNOSIS — H25812 Combined forms of age-related cataract, left eye: Secondary | ICD-10-CM | POA: Diagnosis not present

## 2023-03-06 DIAGNOSIS — Z01818 Encounter for other preprocedural examination: Secondary | ICD-10-CM | POA: Diagnosis not present

## 2023-03-15 DIAGNOSIS — M810 Age-related osteoporosis without current pathological fracture: Secondary | ICD-10-CM | POA: Diagnosis not present

## 2023-03-16 DIAGNOSIS — H25812 Combined forms of age-related cataract, left eye: Secondary | ICD-10-CM | POA: Diagnosis not present

## 2023-03-20 DIAGNOSIS — R2689 Other abnormalities of gait and mobility: Secondary | ICD-10-CM | POA: Diagnosis not present

## 2023-03-20 DIAGNOSIS — M25572 Pain in left ankle and joints of left foot: Secondary | ICD-10-CM | POA: Diagnosis not present

## 2023-03-23 DIAGNOSIS — R2689 Other abnormalities of gait and mobility: Secondary | ICD-10-CM | POA: Diagnosis not present

## 2023-03-23 DIAGNOSIS — M25572 Pain in left ankle and joints of left foot: Secondary | ICD-10-CM | POA: Diagnosis not present

## 2023-04-03 DIAGNOSIS — R2689 Other abnormalities of gait and mobility: Secondary | ICD-10-CM | POA: Diagnosis not present

## 2023-04-03 DIAGNOSIS — M25572 Pain in left ankle and joints of left foot: Secondary | ICD-10-CM | POA: Diagnosis not present

## 2023-04-05 DIAGNOSIS — R2689 Other abnormalities of gait and mobility: Secondary | ICD-10-CM | POA: Diagnosis not present

## 2023-04-05 DIAGNOSIS — M25572 Pain in left ankle and joints of left foot: Secondary | ICD-10-CM | POA: Diagnosis not present

## 2023-04-06 DIAGNOSIS — H25811 Combined forms of age-related cataract, right eye: Secondary | ICD-10-CM | POA: Diagnosis not present

## 2023-04-13 ENCOUNTER — Other Ambulatory Visit (HOSPITAL_BASED_OUTPATIENT_CLINIC_OR_DEPARTMENT_OTHER): Payer: Self-pay

## 2023-04-13 MED ORDER — OZEMPIC (2 MG/DOSE) 8 MG/3ML ~~LOC~~ SOPN
2.0000 | PEN_INJECTOR | SUBCUTANEOUS | 1 refills | Status: AC
  Filled 2023-04-13: qty 3, 28d supply, fill #0

## 2023-04-17 DIAGNOSIS — M810 Age-related osteoporosis without current pathological fracture: Secondary | ICD-10-CM | POA: Diagnosis not present

## 2023-04-25 ENCOUNTER — Other Ambulatory Visit (INDEPENDENT_AMBULATORY_CARE_PROVIDER_SITE_OTHER): Payer: Medicare Other

## 2023-04-25 ENCOUNTER — Ambulatory Visit: Payer: Medicare Other | Admitting: Orthopaedic Surgery

## 2023-04-25 DIAGNOSIS — M545 Low back pain, unspecified: Secondary | ICD-10-CM

## 2023-04-25 DIAGNOSIS — M25572 Pain in left ankle and joints of left foot: Secondary | ICD-10-CM | POA: Diagnosis not present

## 2023-04-25 MED ORDER — PREDNISONE 10 MG (21) PO TBPK: ORAL_TABLET | ORAL | 3 refills | Status: AC

## 2023-04-25 NOTE — Progress Notes (Addendum)
Office Visit Note   Patient: Judith Chavez           Date of Birth: Dec 28, 1945           MRN: [id number] Visit Date: 04/25/2023              Requested by: Lupita Raider, MD [address] [place],  [place] [address] PCP: Lupita Raider, MD   Assessment & Plan: Visit Diagnoses:  1. Low back pain, unspecified back pain laterality, unspecified chronicity, unspecified whether sciatica present   2. Pain in left ankle and joints of left foot     Plan: Brinya is a 77 year old female with left ankle pain and low back radiculopathy.  Impression is peroneal tendinopathy and adjacent level disease of the lumbar spine with radiculopathy.  We will send her to physical therapy for the ankle and the back.  Will send in prescription for prednisone Dosepak.  ASO brace for the ankle and wean as tolerated.  If symptoms do not improve with the back she will follow-up with Dr. Wynetta Emery  Follow-Up Instructions: No follow-ups on file.   Orders:  Orders Placed This Encounter  Procedures   XR Lumbar Spine 2-3 Views   XR Ankle Complete Left   Ambulatory referral to Physical Therapy   Meds ordered this encounter  Medications   predniSONE (STERAPRED UNI-PAK 21 TAB) 10 MG (21) TBPK tablet    Sig: Take as directed    Dispense:  21 tablet    Refill:  3      Procedures: No procedures performed   Clinical Data: No additional findings.   Subjective: Chief Complaint  Patient presents with   Left Ankle - Pain   Lower Back - Pain    HPI Judith Chavez is a 77 year old female comes in for evaluation of left ankle pain and back pain for about 2 to 3 months.  She feels a swelling around the lateral side of the ankle.  Denies any injuries.  States that his symptoms are worse with standing and walking.  For the back she underwent fusion of L3-4 and L4-5 by Dr. Wynetta Emery in 2016.  She is complaining of radicular leg pain and numbness in the left foot.  She has pain in the morning.  She has been using heat  and massages without significant relief.   Review of Systems  Constitutional: Negative.   HENT: Negative.    Eyes: Negative.   Respiratory: Negative.    Cardiovascular: Negative.   Endocrine: Negative.   Musculoskeletal: Negative.   Neurological: Negative.   Hematological: Negative.   Psychiatric/Behavioral: Negative.       Objective: Vital Signs: There were no vitals taken for this visit.  Physical Exam Vitals and nursing note reviewed.  Constitutional:      Appearance: She is well-developed.  HENT:     Head: Atraumatic.     Nose: Nose normal.  Eyes:     Extraocular Movements: Extraocular movements intact.  Cardiovascular:     Pulses: Normal pulses.  Pulmonary:     Effort: Pulmonary effort is normal.  Abdominal:     Palpations: Abdomen is soft.  Musculoskeletal:     Cervical back: Neck supple.  Skin:    General: Skin is warm.     Capillary Refill: Capillary refill takes less than 2 seconds.  Neurological:     Mental Status: She is alert. Mental status is at baseline.  Psychiatric:        Behavior: Behavior normal.  Thought Content: Thought content normal.        Judgment: Judgment normal.     Ortho Exam Exam of the left ankle shows tenderness along the course of the peroneal tendons.  There is no subluxation.  She has adequate strength and range of motion.  Normal sensation.  Exam of the lumbar spine shows positive straight leg raise.  No motor or sensory deficits. Specialty Comments:  No specialty comments available.  Imaging: XR Lumbar Spine 2-3 Views  Result Date: 04/25/2023 X-rays of the lumbar spine shows prior fusion of L3-4 and 4 5.  There is adjacent level disease with disc space narrowing.  XR Ankle Complete Left  Result Date: 04/25/2023 X-rays of the left ankle show no acute abnormalities.  Mild osteoarthritis.  Mild spurring of the medial malleolus.    PMFS History: Patient Active Problem List   Diagnosis Date Noted   Status post  total right knee replacement 02/22/2021   History of right breast cancer 04/13/2020   Genetic testing 03/10/2020   Family history of breast cancer 02/28/2020   Ductal carcinoma in situ (DCIS) of right breast 02/26/2020   Primary osteoarthritis of right knee 03/20/2019   Primary osteoarthritis of left knee 03/20/2019   Primary osteoarthritis of both knees 04/24/2017   Spondylolisthesis at L3-L4 level 06/01/2015   Past Medical History:  Diagnosis Date   Anemia    as a teenager   Anxiety    Arthritis    knees   Cancer ([facility]) 03/2020   right breast DCIS, LCIS   Depression    Diabetes mellitus without complication ([facility])    Type 2   Family history of breast cancer 02/28/2020   Headache    migraines   History of pneumonia    Numbness    "left foot"    Pneumonia    Poor circulation     Family History  Problem Relation Age of Onset   Breast cancer Cousin        maternal; dx and d. <50   Breast cancer Cousin        maternal; dx and d. <50   Breast cancer Cousin        maternal; dx and d. <50   Breast cancer Cousin        maternal; dx and d. <50   Cancer Paternal Aunt        unknown type cancer dx 42s    Past Surgical History:  Procedure Laterality Date   ABCESS DRAINAGE     "after cesarean- in hospital for 3 months"   ABDOMINAL HYSTERECTOMY     BACK SURGERY     CESAREAN SECTION  07/11/1968   CHOLECYSTECTOMY     COLONOSCOPY     DILATION AND CURETTAGE OF UTERUS     EYE SURGERY Right    MASTECTOMY     MENISCUS REPAIR Bilateral    SIMPLE MASTECTOMY WITH AXILLARY SENTINEL NODE BIOPSY Right 04/13/2020   Procedure: RIGHT MASTECTOMY WITH RIGHT AXILLARY SENTINEL NODE BIOPSY;  Surgeon: Abigail Miyamoto, MD;  Location: [facility];  Service: General;  Laterality: Right;   SPINE SURGERY     TOTAL KNEE ARTHROPLASTY Right 02/22/2021   Procedure: RIGHT TOTAL KNEE ARTHROPLASTY;  Surgeon: Tarry Kos, MD;  [facility];  Service: Orthopedics;  Laterality: Right;    Social History   Occupational History   Occupation: retired   Tobacco Use   Smoking status: Former    Current packs/day: 0.00  Average packs/day: 0.5 packs/day for 10.0 years (5.0 ttl pk-yrs)    Types: Cigarettes    Start date: 07/11/1976    Quit date: 07/11/1986    Years since quitting: 36.8   Smokeless tobacco: Never  Vaping Use   Vaping status: Never Used  Substance and Sexual Activity   Alcohol use: Yes    Comment: occassionally   Drug use: No   Sexual activity: Not on file

## 2023-05-05 ENCOUNTER — Other Ambulatory Visit: Payer: Self-pay

## 2023-05-05 ENCOUNTER — Ambulatory Visit (INDEPENDENT_AMBULATORY_CARE_PROVIDER_SITE_OTHER): Payer: Medicare Other | Admitting: Physical Therapy

## 2023-05-05 ENCOUNTER — Encounter: Payer: Self-pay | Admitting: Physical Therapy

## 2023-05-05 DIAGNOSIS — M5459 Other low back pain: Secondary | ICD-10-CM

## 2023-05-05 DIAGNOSIS — M25572 Pain in left ankle and joints of left foot: Secondary | ICD-10-CM

## 2023-05-05 DIAGNOSIS — R262 Difficulty in walking, not elsewhere classified: Secondary | ICD-10-CM

## 2023-05-05 DIAGNOSIS — M6281 Muscle weakness (generalized): Secondary | ICD-10-CM

## 2023-05-05 NOTE — Therapy (Signed)
OUTPATIENT PHYSICAL THERAPY THORACOLUMBAR EVALUATION   Patient Name: Judith Chavez MRN: [id number] DOB:1945/10/05, 77 y.o., female Today's Date: 05/05/2023  END OF SESSION:  PT End of Session - 05/05/23 0952     Visit Number 1    Number of Visits 13    Date for PT Re-Evaluation 06/16/23    Authorization Type MCR, [place]SH[employer], [employer]BS [place]    Authorization Time Period 05/05/23 to 06/30/23    Progress Note Due on Visit 10    PT Start Time 0932    PT Stop Time 1011    PT Time Calculation (min) 39 min    Activity Tolerance Patient tolerated treatment well    Behavior During Therapy [facility] for tasks assessed/performed;Flat affect;Lability             Past Medical History:  Diagnosis Date   Anemia    as a teenager   Anxiety    Arthritis    knees   Cancer ([facility]) 03/2020   right breast DCIS, LCIS   Depression    Diabetes mellitus without complication ([facility])    Type 2   Family history of breast cancer 02/28/2020   Headache    migraines   History of pneumonia    Numbness    "left foot"    Pneumonia    Poor circulation    Past Surgical History:  Procedure Laterality Date   ABCESS DRAINAGE     "after cesarean- in hospital for 3 months"   ABDOMINAL HYSTERECTOMY     BACK SURGERY     CESAREAN SECTION  07/11/1968   CHOLECYSTECTOMY     COLONOSCOPY     DILATION AND CURETTAGE OF UTERUS     EYE SURGERY Right    MASTECTOMY     MENISCUS REPAIR Bilateral    SIMPLE MASTECTOMY WITH AXILLARY SENTINEL NODE BIOPSY Right 04/13/2020   Procedure: RIGHT MASTECTOMY WITH RIGHT AXILLARY SENTINEL NODE BIOPSY;  Surgeon: Abigail Miyamoto, MD;  Location: [facility];  Service: General;  Laterality: Right;   SPINE SURGERY     TOTAL KNEE ARTHROPLASTY Right 02/22/2021   Procedure: RIGHT TOTAL KNEE ARTHROPLASTY;  Surgeon: Tarry Kos, MD;  Location[facility];  Service: Orthopedics;  Laterality: Right;   Patient Active Problem List   Diagnosis Date Noted   Status post total right knee  replacement 02/22/2021   History of right breast cancer 04/13/2020   Genetic testing 03/10/2020   Family history of breast cancer 02/28/2020   Ductal carcinoma in situ (DCIS) of right breast 02/26/2020   Primary osteoarthritis of right knee 03/20/2019   Primary osteoarthritis of left knee 03/20/2019   Primary osteoarthritis of both knees 04/24/2017   Spondylolisthesis at L3-L4 level 06/01/2015    PCP: Lupita Raider MD   REFERRING PROVIDER: Tarry Kos, MD  REFERRING DIAG: M54.50 (ICD-10-CM) - Low back pain, unspecified back pain laterality, unspecified chronicity, unspecified whether sciatica present M25.572 (ICD-10-CM) - Pain in left ankle and joints of left foot  Rationale for Evaluation and Treatment: Rehabilitation  THERAPY DIAG:  Other low back pain  Muscle weakness (generalized)  Pain in left ankle and joints of left foot  Difficulty in walking, not elsewhere classified  ONSET DATE: 2016 back surgery, pain returned 2024  SUBJECTIVE:  SUBJECTIVE STATEMENT:  Had back surgery in 2016 with Dr. Wynetta Emery, then this year it came back. Started getting injections for osteoporosis in August this year and started having more aches and pains. My left ankle swells and the pain comes up the leg from my ankle, I get a lot of leg cramps at night. My back is very painful, Dr. Roda Shutters recommended that I contact Dr. Wynetta Emery again, I see my PCP soon and would like to talk to her first.   PERTINENT HISTORY:  Anxiety, knee OA, breast CA, depression, DM, back surgery, meniscus repair, R TKA 2022  PAIN:  Are you having pain? Yes: NPRS scale: "back is really hurting today"/10 Pain location: across low back more on R side than L side  Pain description: throbbing and pressure, tender to touch   Aggravating factors:  unclear  Relieving factors: pulling and stretching back  PRECAUTIONS: None  RED FLAGS: None   WEIGHT BEARING RESTRICTIONS: No  FALLS:  Has patient fallen in last 6 months? No  LIVING ENVIRONMENT: Lives with: lives with their spouse Lives in: House/apartment Stairs:  has upstairs but does not have to use it    OCCUPATION: retired   PLOF: Independent, Independent with basic ADLs, Independent with gait, and Independent with transfers  PATIENT GOALS: less pain   NEXT MD VISIT: referring PRN Lew Dawes PT   OBJECTIVE:  Note: Objective measures were completed at Evaluation unless otherwise noted.  DIAGNOSTIC FINDINGS:  X-rays of the lumbar spine shows prior fusion of L3-4 and 4 5.  There is  adjacent level disease with disc space narrowing.  X-rays of the left ankle show no acute abnormalities.  Mild  osteoarthritis.  Mild spurring of the medial malleolus.  Summary:  RIGHT:  - There is no evidence of deep vein thrombosis in the lower extremity.    - No cystic structure found in the popliteal fossa.    LEFT:  - No evidence of common femoral vein obstruction.     PATIENT SURVEYS:  FOTO 45 predicted 46 in 14 visits     COGNITION: Overall cognitive status: Within functional limits for tasks assessed and emotionally labile         POSTURE: rounded shoulders, forward head, decreased lumbar lordosis, decreased thoracic kyphosis, and flexed trunk   PALPATION: L peroneals TTP   LUMBAR ROM:   AROM eval  Flexion Mild limitation, increased pain deferred RFIS   Extension Hypermobile, sharp pain deferred REIS  Right lateral flexion Moderate limitation sharp pain   Left lateral flexion WNL   Right rotation Moderate limitation   Left rotation Moderate limitation    (Blank rows = not tested)  LOWER EXTREMITY ROM:     Active  Right eval Left eval  Hip flexion    Hip extension    Hip abduction    Hip adduction    Hip internal rotation    Hip external rotation     Knee flexion    Knee extension    Ankle dorsiflexion  9*  Ankle plantarflexion  48*  Ankle inversion  23*  Ankle eversion  -6* (unable to actively get to neutral inversion/eversion)   (Blank rows = not tested)  LOWER EXTREMITY MMT:    MMT Right eval Left eval  Hip flexion 3 sudden drop  3 sudden drop  Hip extension    Hip abduction 3- 3  Hip adduction    Hip internal rotation    Hip external rotation    Knee flexion 4+ 3  Knee extension 4+ 4-  Ankle dorsiflexion 3+ 3  Ankle plantarflexion    Ankle inversion 4 4  Ankle eversion 4 3-   (Blank rows = not tested)    GAIT: Distance walked: in clinic distances  Assistive device utilized: None Level of assistance: Complete Independence Comments: antalgic, favors L LE, back very stiff   TODAY'S TREATMENT:                                                                                                                              DATE:   05/05/23- eval, HEP practice, appropriate education    PATIENT EDUCATION:  Education details: exam findings, POC, HEP, encouraged exercise on reclining bike at home as long as it does not increase pain, role of exercise in managing depression and chronic pain  Person educated: Patient Education method: Explanation, Demonstration, and Handouts Education comprehension: verbalized understanding, returned demonstration, and needs further education  HOME EXERCISE PROGRAM: Access Code: [email or web address] URL: [email or web address] Date: 05/05/2023 Prepared by: Nedra Hai  Exercises - Supine Lower Trunk Rotation  - 2 x daily - 7 x weekly - 1 sets - 10 reps - 3 seconds  hold - Supine Posterior Pelvic Tilt  - 2 x daily - 7 x weekly - 1 sets - 15 reps - 3 seconds  hold - Seated Ankle Alphabet  - 2 x daily - 7 x weekly - 1 sets - 2 reps - Seated Quadratus Lumborum Stretch in Chair  - 2 x daily - 7 x weekly - 1 sets - 3 reps - 30 seconds  hold  ASSESSMENT:  CLINICAL  IMPRESSION: Patient is a 77 y.o. F who was seen today for physical therapy evaluation and treatment for M54.50 (ICD-10-CM) - Low back pain, unspecified back pain laterality, unspecified chronicity, unspecified whether sciatica present M25.572 (ICD-10-CM) - Pain in left ankle and joints of left foot. Exam with objective findings as above. Will benefit from skilled PT services to address pain, sounds like she might have an upcoming trip coming up which we will plan to work around.    OBJECTIVE IMPAIRMENTS: Abnormal gait, difficulty walking, decreased ROM, decreased strength, impaired perceived functional ability, increased muscle spasms, impaired flexibility, improper body mechanics, postural dysfunction, obesity, and pain.   ACTIVITY LIMITATIONS: carrying, lifting, bending, sitting, standing, squatting, sleeping, stairs, transfers, bed mobility, and locomotion level  PARTICIPATION LIMITATIONS: meal prep, cleaning, laundry, driving, shopping, community activity, and yard work  PERSONAL FACTORS: Age, Behavior pattern, Fitness, Past/current experiences, Social background, and Time since onset of injury/illness/exacerbation are also affecting patient's functional outcome.   REHAB POTENTIAL: Fair psychosocial factors, chronicity of pain   CLINICAL DECISION MAKING: Stable/uncomplicated  EVALUATION COMPLEXITY: Low   GOALS: Goals reviewed with patient? No  SHORT TERM GOALS: Target date: 05/26/2023    Will be compliant with appropriate progressive HEP  Baseline: Goal status: INITIAL  2.  Pain in L ankle and low back to be no more than 6/10 at  worst  Baseline:  Goal status: INITIAL  3.  Will demonstrate improved functional biomechanics to assist in avoiding pain exacerbations with movement  Baseline:  Goal status: INITIAL  4.  Lumbar and L ankle ROM to be WNL  Baseline:  Goal status: INITIAL    LONG TERM GOALS: Target date: 06/16/2023    MMT to improve by at least 1 grade all tested  groups  Baseline:  Goal status: INITIAL  2.  Low back and L ankle pain to be no more than 4/10 at worst  Baseline:  Goal status: INITIAL  3.  Will be able to perform all housework and desired functional tasks at home (such as getting in/out of bath and jacuzzi) without increased pain levels  Baseline:  Goal status: INITIAL  4.  Will have been able to return to desired exercise program independently without increase in pain  Baseline:  Goal status: INITIAL  5.  FOTO score to be at goal level or higher by time of DC Baseline:  Goal status: INITIAL    PLAN:  PT FREQUENCY: 2x/week  PT DURATION: 6 weeks  PLANNED INTERVENTIONS: 97164- PT Re-evaluation, 97110-Therapeutic exercises, 97530- Therapeutic activity, 97112- Neuromuscular re-education, 97535- Self Care, 41324- Manual therapy, 97014- Electrical stimulation (unattended), 97035- Ultrasound, 40102- Traction (mechanical), Patient/Family education, Joint mobilization, Spinal mobilization, Cryotherapy, and Moist heat.  PLAN FOR NEXT SESSION: how is HEP? Core and hip strength, lumbar and ankle mobility, manual PRN   Nedra Hai, PT, DPT 05/05/23 10:15 AM

## 2023-05-22 DIAGNOSIS — M810 Age-related osteoporosis without current pathological fracture: Secondary | ICD-10-CM | POA: Diagnosis not present

## 2023-05-23 ENCOUNTER — Encounter: Payer: Self-pay | Admitting: Physical Therapy

## 2023-05-23 ENCOUNTER — Ambulatory Visit (INDEPENDENT_AMBULATORY_CARE_PROVIDER_SITE_OTHER): Payer: Medicare Other | Admitting: Physical Therapy

## 2023-05-23 DIAGNOSIS — M6281 Muscle weakness (generalized): Secondary | ICD-10-CM

## 2023-05-23 DIAGNOSIS — M25572 Pain in left ankle and joints of left foot: Secondary | ICD-10-CM | POA: Diagnosis not present

## 2023-05-23 DIAGNOSIS — R262 Difficulty in walking, not elsewhere classified: Secondary | ICD-10-CM

## 2023-05-23 DIAGNOSIS — M5459 Other low back pain: Secondary | ICD-10-CM | POA: Diagnosis not present

## 2023-05-23 NOTE — Therapy (Signed)
OUTPATIENT PHYSICAL THERAPY THORACOLUMBAR TREATMENT   Patient Name: Judith Chavez MRN: [id number] DOB:03/09/1946, 77 y.o., female Today's Date: 05/23/2023  END OF SESSION:  PT End of Session - 05/23/23 1107     Visit Number 2    Number of Visits 13    Date for PT Re-Evaluation 06/16/23    Authorization Type MCR, [place], [employer] [place]    Authorization Time Period 05/05/23 to 06/30/23    Progress Note Due on Visit 10    PT Start Time 1103    PT Stop Time 1142    PT Time Calculation (min) 39 min    Activity Tolerance Patient tolerated treatment well    Behavior During Therapy [facility] for tasks assessed/performed;Flat affect              Past Medical History:  Diagnosis Date   Anemia    as a teenager   Anxiety    Arthritis    knees   Cancer (HCC) 03/2020   right breast DCIS, LCIS   Depression    Diabetes mellitus without complication (HCC)    Type 2   Family history of breast cancer 02/28/2020   Headache    migraines   History of pneumonia    Numbness    "left foot"    Pneumonia    Poor circulation    Past Surgical History:  Procedure Laterality Date   ABCESS DRAINAGE     "after cesarean- in hospital for 3 months"   ABDOMINAL HYSTERECTOMY     BACK SURGERY     CESAREAN SECTION  07/11/1968   CHOLECYSTECTOMY     COLONOSCOPY     DILATION AND CURETTAGE OF UTERUS     EYE SURGERY Right    MASTECTOMY     MENISCUS REPAIR Bilateral    SIMPLE MASTECTOMY WITH AXILLARY SENTINEL NODE BIOPSY Right 04/13/2020   Procedure: RIGHT MASTECTOMY WITH RIGHT AXILLARY SENTINEL NODE BIOPSY;  Surgeon: Abigail Miyamoto, MD;  Location: [facility];  Service: General;  Laterality: Right;   SPINE SURGERY     TOTAL KNEE ARTHROPLASTY Right 02/22/2021   Procedure: RIGHT TOTAL KNEE ARTHROPLASTY;  Surgeon: Tarry Kos, MD;  Location: [facility] OR;  Service: Orthopedics;  Laterality: Right;   Patient Active Problem List   Diagnosis Date Noted   Status post total right knee  replacement 02/22/2021   History of right breast cancer 04/13/2020   Genetic testing 03/10/2020   Family history of breast cancer 02/28/2020   Ductal carcinoma in situ (DCIS) of right breast 02/26/2020   Primary osteoarthritis of right knee 03/20/2019   Primary osteoarthritis of left knee 03/20/2019   Primary osteoarthritis of both knees 04/24/2017   Spondylolisthesis at L3-L4 level 06/01/2015    PCP: Lupita Raider MD   REFERRING PROVIDER: Tarry Kos, MD  REFERRING DIAG: M54.50 (ICD-10-CM) - Low back pain, unspecified back pain laterality, unspecified chronicity, unspecified whether sciatica present M25.572 (ICD-10-CM) - Pain in left ankle and joints of left foot  Rationale for Evaluation and Treatment: Rehabilitation  THERAPY DIAG:  Other low back pain  Muscle weakness (generalized)  Pain in left ankle and joints of left foot  Difficulty in walking, not elsewhere classified  ONSET DATE: 2016 back surgery, pain returned 2024  SUBJECTIVE:  SUBJECTIVE STATEMENT:   Pain is about the same, up until last week when my cold started I was doing those exercises you gave me faithfully. Not noticing much that's making it better or worse right now. Have been using athletic tape on my ankle. Left leg is weak. When I do the exercise when I reach over my head, I do feel a little better but after I continued to move my pain comes right back to where it started from.     EVAL: Had back surgery in 2016 with Dr. Wynetta Emery, then this year it came back. Started getting injections for osteoporosis in August this year and started having more aches and pains. My left ankle swells and the pain comes up the leg from my ankle, I get a lot of leg cramps at night. My back is very painful, Dr. Roda Shutters recommended that I contact  Dr. Wynetta Emery again, I see my PCP soon and would like to talk to her first.   PERTINENT HISTORY:  Anxiety, knee OA, breast CA, depression, DM, back surgery, meniscus repair, R TKA 2022  PAIN:  Are you having pain? Yes: NPRS scale: 8/10 Pain location: across low back more on R side than L side  Pain description: throbbing and pressure, tender to touch   Aggravating factors: unclear  Relieving factors: pulling and stretching back  PRECAUTIONS: None  RED FLAGS: None   WEIGHT BEARING RESTRICTIONS: No  FALLS:  Has patient fallen in last 6 months? No  LIVING ENVIRONMENT: Lives with: lives with their spouse Lives in: House/apartment Stairs:  has upstairs but does not have to use it    OCCUPATION: retired   PLOF: Independent, Independent with basic ADLs, Independent with gait, and Independent with transfers  PATIENT GOALS: less pain   NEXT MD VISIT: referring PRN Lew Dawes PT   OBJECTIVE:  Note: Objective measures were completed at Evaluation unless otherwise noted.  DIAGNOSTIC FINDINGS:  X-rays of the lumbar spine shows prior fusion of L3-4 and 4 5.  There is  adjacent level disease with disc space narrowing.  X-rays of the left ankle show no acute abnormalities.  Mild  osteoarthritis.  Mild spurring of the medial malleolus.  Summary:  RIGHT:  - There is no evidence of deep vein thrombosis in the lower extremity.    - No cystic structure found in the popliteal fossa.    LEFT:  - No evidence of common femoral vein obstruction.     PATIENT SURVEYS:  FOTO 45 predicted 46 in 14 visits     COGNITION: Overall cognitive status: Within functional limits for tasks assessed and emotionally labile         POSTURE: rounded shoulders, forward head, decreased lumbar lordosis, decreased thoracic kyphosis, and flexed trunk   PALPATION: L peroneals TTP   LUMBAR ROM:   AROM eval  Flexion Mild limitation, increased pain deferred RFIS   Extension Hypermobile, sharp pain  deferred REIS  Right lateral flexion Moderate limitation sharp pain   Left lateral flexion WNL   Right rotation Moderate limitation   Left rotation Moderate limitation    (Blank rows = not tested)  LOWER EXTREMITY ROM:     Active  Right eval Left eval  Hip flexion    Hip extension    Hip abduction    Hip adduction    Hip internal rotation    Hip external rotation    Knee flexion    Knee extension    Ankle dorsiflexion  9*  Ankle plantarflexion  48*  Ankle inversion  23*  Ankle eversion  -6* (unable to actively get to neutral inversion/eversion)   (Blank rows = not tested)  LOWER EXTREMITY MMT:    MMT Right eval Left eval  Hip flexion 3 sudden drop  3 sudden drop  Hip extension    Hip abduction 3- 3  Hip adduction    Hip internal rotation    Hip external rotation    Knee flexion 4+ 3  Knee extension 4+ 4-  Ankle dorsiflexion 3+ 3  Ankle plantarflexion    Ankle inversion 4 4  Ankle eversion 4 3-   (Blank rows = not tested)    GAIT: Distance walked: in clinic distances  Assistive device utilized: None Level of assistance: Complete Independence Comments: antalgic, favors L LE, back very stiff   TODAY'S TREATMENT:                                                                                                                              DATE:   05/23/23  TherEx  LAQs red TB x15 L LE  HS red TB x15 L LE  Seated hip abduction with red TB x15 Seated marches red TB x10 B Piriformis stretch 2x30 seconds B TA set + seated march x15 STS with 4 inch box under RLE for increased WB L x7 BUEs  Hip hikes x15 B  Hip hike + ABD x10 B cues for sequencing     MHP on Lx spine during initial seated exercises x10 minutes, PT provided direct supervision and monitoring     05/05/23- eval, HEP practice, appropriate education    PATIENT EDUCATION:  Education details: exam findings, POC, HEP, encouraged exercise on reclining bike at home as long as it does not  increase pain, role of exercise in managing depression and chronic pain  Person educated: Patient Education method: Explanation, Demonstration, and Handouts Education comprehension: verbalized understanding, returned demonstration, and needs further education  HOME EXERCISE PROGRAM:  Access Code: [email or web address] URL: [email or web address] Date: 05/23/2023 Prepared by: Nedra Hai  Exercises - Supine Lower Trunk Rotation  - 2 x daily - 7 x weekly - 1 sets - 10 reps - 3 seconds  hold - Supine Posterior Pelvic Tilt  - 2 x daily - 7 x weekly - 1 sets - 15 reps - 3 seconds  hold - Seated Ankle Alphabet  - 2 x daily - 7 x weekly - 1 sets - 2 reps - Seated Quadratus Lumborum Stretch in Chair  - 2 x daily - 7 x weekly - 1 sets - 3 reps - 30 seconds  hold - Seated Hip Abduction with Resistance  - 1 x daily - 4 x weekly - 2 sets - 10 reps - 2 seconds  hold - Seated March with Resistance  - 1 x daily - 4 x weekly - 2 sets - 10 reps - 1 second  hold    ASSESSMENT:  CLINICAL IMPRESSION:  Pt arrives today feeling OK, low energy due to having a stubborn cold (covid tests repeatedly negative per her report). Focused on functional strengthening and lumbar ROM as time and exercise tolerance allowed today. Encouraged exercise in warm pool, she is interested in water exercises but hesitant to get in cool water. Will continue to progress as able/tolerated.    EVAL: Patient is a 77 y.o. F who was seen today for physical therapy evaluation and treatment for M54.50 (ICD-10-CM) - Low back pain, unspecified back pain laterality, unspecified chronicity, unspecified whether sciatica present M25.572 (ICD-10-CM) - Pain in left ankle and joints of left foot. Exam with objective findings as above. Will benefit from skilled PT services to address pain, sounds like she might have an upcoming trip coming up which we will plan to work around.    OBJECTIVE IMPAIRMENTS: Abnormal gait, difficulty walking,  decreased ROM, decreased strength, impaired perceived functional ability, increased muscle spasms, impaired flexibility, improper body mechanics, postural dysfunction, obesity, and pain.   ACTIVITY LIMITATIONS: carrying, lifting, bending, sitting, standing, squatting, sleeping, stairs, transfers, bed mobility, and locomotion level  PARTICIPATION LIMITATIONS: meal prep, cleaning, laundry, driving, shopping, community activity, and yard work  PERSONAL FACTORS: Age, Behavior pattern, Fitness, Past/current experiences, Social background, and Time since onset of injury/illness/exacerbation are also affecting patient's functional outcome.   REHAB POTENTIAL: Fair psychosocial factors, chronicity of pain   CLINICAL DECISION MAKING: Stable/uncomplicated  EVALUATION COMPLEXITY: Low   GOALS: Goals reviewed with patient? No  SHORT TERM GOALS: Target date: 05/26/2023    Will be compliant with appropriate progressive HEP  Baseline: Goal status: INITIAL  2.  Pain in L ankle and low back to be no more than 6/10 at worst  Baseline:  Goal status: INITIAL  3.  Will demonstrate improved functional biomechanics to assist in avoiding pain exacerbations with movement  Baseline:  Goal status: INITIAL  4.  Lumbar and L ankle ROM to be WNL  Baseline:  Goal status: INITIAL    LONG TERM GOALS: Target date: 06/16/2023    MMT to improve by at least 1 grade all tested groups  Baseline:  Goal status: INITIAL  2.  Low back and L ankle pain to be no more than 4/10 at worst  Baseline:  Goal status: INITIAL  3.  Will be able to perform all housework and desired functional tasks at home (such as getting in/out of bath and jacuzzi) without increased pain levels  Baseline:  Goal status: INITIAL  4.  Will have been able to return to desired exercise program independently without increase in pain  Baseline:  Goal status: INITIAL  5.  FOTO score to be at goal level or higher by time of DC Baseline:   Goal status: INITIAL    PLAN:  PT FREQUENCY: 2x/week  PT DURATION: 6 weeks  PLANNED INTERVENTIONS: 97164- PT Re-evaluation, 97110-Therapeutic exercises, 97530- Therapeutic activity, 97112- Neuromuscular re-education, 97535- Self Care, 08657- Manual therapy, 97014- Electrical stimulation (unattended), 97035- Ultrasound, 84696- Traction (mechanical), Patient/Family education, Joint mobilization, Spinal mobilization, Cryotherapy, and Moist heat.  PLAN FOR NEXT SESSION:  Core and hip strength, lumbar and ankle mobility, manual PRN   Nedra Hai, PT, DPT 05/23/23 11:42 AM

## 2023-05-25 ENCOUNTER — Other Ambulatory Visit (HOSPITAL_BASED_OUTPATIENT_CLINIC_OR_DEPARTMENT_OTHER): Payer: Self-pay

## 2023-05-25 MED ORDER — OZEMPIC (2 MG/DOSE) 8 MG/3ML ~~LOC~~ SOPN
2.0000 mg | PEN_INJECTOR | SUBCUTANEOUS | 11 refills | Status: AC
  Filled 2023-05-25: qty 3, 28d supply, fill #0
  Filled 2023-06-20: qty 3, 28d supply, fill #1
  Filled 2023-07-19: qty 3, 28d supply, fill #2
  Filled 2023-08-17: qty 3, 28d supply, fill #3
  Filled 2023-09-18 (×2): qty 3, 28d supply, fill #4
  Filled 2023-10-11: qty 3, 28d supply, fill #5
  Filled 2023-11-10: qty 3, 28d supply, fill #6
  Filled 2023-12-09 (×2): qty 3, 28d supply, fill #7
  Filled 2024-01-11: qty 3, 28d supply, fill #8
  Filled 2024-02-23: qty 3, 28d supply, fill #9
  Filled 2024-03-19: qty 3, 28d supply, fill #10
  Filled 2024-04-17: qty 3, 28d supply, fill #11
  Filled ????-??-??: fill #4

## 2023-05-26 ENCOUNTER — Ambulatory Visit (INDEPENDENT_AMBULATORY_CARE_PROVIDER_SITE_OTHER): Payer: Medicare Other | Admitting: Physical Therapy

## 2023-05-26 ENCOUNTER — Encounter: Payer: Self-pay | Admitting: Physical Therapy

## 2023-05-26 DIAGNOSIS — M6281 Muscle weakness (generalized): Secondary | ICD-10-CM

## 2023-05-26 DIAGNOSIS — M25572 Pain in left ankle and joints of left foot: Secondary | ICD-10-CM | POA: Diagnosis not present

## 2023-05-26 DIAGNOSIS — M5459 Other low back pain: Secondary | ICD-10-CM | POA: Diagnosis not present

## 2023-05-26 DIAGNOSIS — R262 Difficulty in walking, not elsewhere classified: Secondary | ICD-10-CM | POA: Diagnosis not present

## 2023-05-26 NOTE — Therapy (Signed)
OUTPATIENT PHYSICAL THERAPY THORACOLUMBAR TREATMENT   Patient Name: Judith Chavez MRN: [id number] DOB:12-Apr-1946, 77 y.o., female Today's Date: 05/26/2023  END OF SESSION:  PT End of Session - 05/26/23 1107     Visit Number 3    Number of Visits 13    Date for PT Re-Evaluation 06/16/23    Authorization Type MCR, [place], [employer] [place]    Authorization Time Period 05/05/23 to 06/30/23    Progress Note Due on Visit 10    PT Start Time 1107   pt a few minutes late   PT Stop Time 1141    PT Time Calculation (min) 34 min    Activity Tolerance Patient tolerated treatment well    Behavior During Therapy [facility] for tasks assessed/performed;Flat affect               Past Medical History:  Diagnosis Date   Anemia    as a teenager   Anxiety    Arthritis    knees   Cancer (HCC) 03/2020   right breast DCIS, LCIS   Depression    Diabetes mellitus without complication (HCC)    Type 2   Family history of breast cancer 02/28/2020   Headache    migraines   History of pneumonia    Numbness    "left foot"    Pneumonia    Poor circulation    Past Surgical History:  Procedure Laterality Date   ABCESS DRAINAGE     "after cesarean- in hospital for 3 months"   ABDOMINAL HYSTERECTOMY     BACK SURGERY     CESAREAN SECTION  07/11/1968   CHOLECYSTECTOMY     COLONOSCOPY     DILATION AND CURETTAGE OF UTERUS     EYE SURGERY Right    MASTECTOMY     MENISCUS REPAIR Bilateral    SIMPLE MASTECTOMY WITH AXILLARY SENTINEL NODE BIOPSY Right 04/13/2020   Procedure: RIGHT MASTECTOMY WITH RIGHT AXILLARY SENTINEL NODE BIOPSY;  Surgeon: Abigail Miyamoto, MD;  Location: [facility];  Service: General;  Laterality: Right;   SPINE SURGERY     TOTAL KNEE ARTHROPLASTY Right 02/22/2021   Procedure: RIGHT TOTAL KNEE ARTHROPLASTY;  Surgeon: Tarry Kos, MD;  Location: [facility] OR;  Service: Orthopedics;  Laterality: Right;   Patient Active Problem List   Diagnosis Date Noted   Status post  total right knee replacement 02/22/2021   History of right breast cancer 04/13/2020   Genetic testing 03/10/2020   Family history of breast cancer 02/28/2020   Ductal carcinoma in situ (DCIS) of right breast 02/26/2020   Primary osteoarthritis of right knee 03/20/2019   Primary osteoarthritis of left knee 03/20/2019   Primary osteoarthritis of both knees 04/24/2017   Spondylolisthesis at L3-L4 level 06/01/2015    PCP: Lupita Raider MD   REFERRING PROVIDER: Tarry Kos, MD  REFERRING DIAG: M54.50 (ICD-10-CM) - Low back pain, unspecified back pain laterality, unspecified chronicity, unspecified whether sciatica present M25.572 (ICD-10-CM) - Pain in left ankle and joints of left foot  Rationale for Evaluation and Treatment: Rehabilitation  THERAPY DIAG:  Other low back pain  Muscle weakness (generalized)  Pain in left ankle and joints of left foot  Difficulty in walking, not elsewhere classified  ONSET DATE: 2016 back surgery, pain returned 2024  SUBJECTIVE:  SUBJECTIVE STATEMENT:  I was here Tuesday and you gave me a great workout, felt good Wednesday too, then Thursday I couldn't get out of bed and couldn't walk on my leg. Kept leg elevated and put ice on it, was worried that my baker's cyst ruptured again. I'll tell you from personal experience when you get older you don't have pain the day after you have it in the days after.      EVAL: Had back surgery in 2016 with Dr. Wynetta Emery, then this year it came back. Started getting injections for osteoporosis in August this year and started having more aches and pains. My left ankle swells and the pain comes up the leg from my ankle, I get a lot of leg cramps at night. My back is very painful, Dr. Roda Shutters recommended that I contact Dr. Wynetta Emery again, I see my  PCP soon and would like to talk to her first. My back was terrible yesterday but its better today. Ankle is still sore it was swollen this morning.   PERTINENT HISTORY:  Anxiety, knee OA, breast CA, depression, DM, back surgery, meniscus repair, R TKA 2022  PAIN:  Are you having pain? Yes: NPRS scale: 6-7/10 Pain location: starting from ankle, coming up side and front of calf up to knee/wrapping around back of knee  Pain description: tender, sensitive, achey, still a little swollen   Aggravating factors: walking Relieving factors: rest, ice or heat depending on the place   PRECAUTIONS: None  RED FLAGS: None   WEIGHT BEARING RESTRICTIONS: No  FALLS:  Has patient fallen in last 6 months? No  LIVING ENVIRONMENT: Lives with: lives with their spouse Lives in: House/apartment Stairs:  has upstairs but does not have to use it    OCCUPATION: retired   PLOF: Independent, Independent with basic ADLs, Independent with gait, and Independent with transfers  PATIENT GOALS: less pain   NEXT MD VISIT: referring PRN Lew Dawes PT   OBJECTIVE:  Note: Objective measures were completed at Evaluation unless otherwise noted.  DIAGNOSTIC FINDINGS:  X-rays of the lumbar spine shows prior fusion of L3-4 and 4 5.  There is  adjacent level disease with disc space narrowing.  X-rays of the left ankle show no acute abnormalities.  Mild  osteoarthritis.  Mild spurring of the medial malleolus.  Summary:  RIGHT:  - There is no evidence of deep vein thrombosis in the lower extremity.    - No cystic structure found in the popliteal fossa.    LEFT:  - No evidence of common femoral vein obstruction.     PATIENT SURVEYS:  FOTO 45 predicted 46 in 14 visits     COGNITION: Overall cognitive status: Within functional limits for tasks assessed and emotionally labile         POSTURE: rounded shoulders, forward head, decreased lumbar lordosis, decreased thoracic kyphosis, and flexed trunk    PALPATION: L peroneals TTP   LUMBAR ROM:   AROM eval  Flexion Mild limitation, increased pain deferred RFIS   Extension Hypermobile, sharp pain deferred REIS  Right lateral flexion Moderate limitation sharp pain   Left lateral flexion WNL   Right rotation Moderate limitation   Left rotation Moderate limitation    (Blank rows = not tested)  LOWER EXTREMITY ROM:     Active  Right eval Left eval  Hip flexion    Hip extension    Hip abduction    Hip adduction    Hip internal rotation    Hip external  rotation    Knee flexion    Knee extension    Ankle dorsiflexion  9*  Ankle plantarflexion  48*  Ankle inversion  23*  Ankle eversion  -6* (unable to actively get to neutral inversion/eversion)   (Blank rows = not tested)  LOWER EXTREMITY MMT:    MMT Right eval Left eval  Hip flexion 3 sudden drop  3 sudden drop  Hip extension    Hip abduction 3- 3  Hip adduction    Hip internal rotation    Hip external rotation    Knee flexion 4+ 3  Knee extension 4+ 4-  Ankle dorsiflexion 3+ 3  Ankle plantarflexion    Ankle inversion 4 4  Ankle eversion 4 3-   (Blank rows = not tested)    GAIT: Distance walked: in clinic distances  Assistive device utilized: None Level of assistance: Complete Independence Comments: antalgic, favors L LE, back very stiff   TODAY'S TREATMENT:                                                                                                                              DATE:    05/26/23  TherEx   Bridges x8 TA sets 15x3 second holds (2 rounds) Hamstring isometrics 10x3 seconds B PPT 15x3 seconds holds Quad sets 10x2 seconds B  Seated TA sets + march x10 B Seated clams red TB x15    05/23/23  TherEx  LAQs red TB x15 L LE  HS red TB x15 L LE  Seated hip abduction with red TB x15 Seated marches red TB x10 B Piriformis stretch 2x30 seconds B TA set + seated march x15 STS with 4 inch box under RLE for increased WB L x7 BUEs   Hip hikes x15 B  Hip hike + ABD x10 B cues for sequencing     MHP on Lx spine during initial seated exercises x10 minutes, PT provided direct supervision and monitoring     05/05/23- eval, HEP practice, appropriate education    PATIENT EDUCATION:  Education details: exam findings, POC, HEP, encouraged exercise on reclining bike at home as long as it does not increase pain, role of exercise in managing depression and chronic pain  Person educated: Patient Education method: Explanation, Demonstration, and Handouts Education comprehension: verbalized understanding, returned demonstration, and needs further education  HOME EXERCISE PROGRAM:  Access Code: [email or web address] URL: [email or web address] Date: 05/23/2023 Prepared by: Nedra Hai  Exercises - Supine Lower Trunk Rotation  - 2 x daily - 7 x weekly - 1 sets - 10 reps - 3 seconds  hold - Supine Posterior Pelvic Tilt  - 2 x daily - 7 x weekly - 1 sets - 15 reps - 3 seconds  hold - Seated Ankle Alphabet  - 2 x daily - 7 x weekly - 1 sets - 2 reps - Seated Quadratus Lumborum Stretch in Chair  - 2 x daily - 7 x weekly - 1 sets -  3 reps - 30 seconds  hold - Seated Hip Abduction with Resistance  - 1 x daily - 4 x weekly - 2 sets - 10 reps - 2 seconds  hold - Seated March with Resistance  - 1 x daily - 4 x weekly - 2 sets - 10 reps - 1 second  hold    ASSESSMENT:  CLINICAL IMPRESSION:  Pt arrives today doing OK, felt good the day after last PT session then had severe pain the following day (2 days after PT). This is an unusual presentation for delayed onset mm soreness, reviewed exercises performed last session and removed standing exercises out of caution today. Worked on open chain and table based activities as able/tolerated this session, still very pain limited today. Hopefully she will not have severe pain after today- if she does, water therapy may be the best route forward if she is agreeable. Pain and gait pattern  improved at EOS, no adverse effects noted today.     EVAL: Patient is a 77 y.o. F who was seen today for physical therapy evaluation and treatment for M54.50 (ICD-10-CM) - Low back pain, unspecified back pain laterality, unspecified chronicity, unspecified whether sciatica present M25.572 (ICD-10-CM) - Pain in left ankle and joints of left foot. Exam with objective findings as above. Will benefit from skilled PT services to address pain, sounds like she might have an upcoming trip coming up which we will plan to work around.    OBJECTIVE IMPAIRMENTS: Abnormal gait, difficulty walking, decreased ROM, decreased strength, impaired perceived functional ability, increased muscle spasms, impaired flexibility, improper body mechanics, postural dysfunction, obesity, and pain.   ACTIVITY LIMITATIONS: carrying, lifting, bending, sitting, standing, squatting, sleeping, stairs, transfers, bed mobility, and locomotion level  PARTICIPATION LIMITATIONS: meal prep, cleaning, laundry, driving, shopping, community activity, and yard work  PERSONAL FACTORS: Age, Behavior pattern, Fitness, Past/current experiences, Social background, and Time since onset of injury/illness/exacerbation are also affecting patient's functional outcome.   REHAB POTENTIAL: Fair psychosocial factors, chronicity of pain   CLINICAL DECISION MAKING: Stable/uncomplicated  EVALUATION COMPLEXITY: Low   GOALS: Goals reviewed with patient? No  SHORT TERM GOALS: Target date: 05/26/2023    Will be compliant with appropriate progressive HEP  Baseline: Goal status: INITIAL  2.  Pain in L ankle and low back to be no more than 6/10 at worst  Baseline:  Goal status: INITIAL  3.  Will demonstrate improved functional biomechanics to assist in avoiding pain exacerbations with movement  Baseline:  Goal status: INITIAL  4.  Lumbar and L ankle ROM to be WNL  Baseline:  Goal status: INITIAL    LONG TERM GOALS: Target date: 06/16/2023     MMT to improve by at least 1 grade all tested groups  Baseline:  Goal status: INITIAL  2.  Low back and L ankle pain to be no more than 4/10 at worst  Baseline:  Goal status: INITIAL  3.  Will be able to perform all housework and desired functional tasks at home (such as getting in/out of bath and jacuzzi) without increased pain levels  Baseline:  Goal status: INITIAL  4.  Will have been able to return to desired exercise program independently without increase in pain  Baseline:  Goal status: INITIAL  5.  FOTO score to be at goal level or higher by time of DC Baseline:  Goal status: INITIAL    PLAN:  PT FREQUENCY: 2x/week  PT DURATION: 6 weeks  PLANNED INTERVENTIONS: 97164- PT Re-evaluation, 97110-Therapeutic exercises,  54098- Therapeutic activity, [id number]- Neuromuscular re-education, [id number]- Self Care, 11914- Manual therapy, 97014- Electrical stimulation (unattended), [id number]- Ultrasound, [id number]- Traction (mechanical), Patient/Family education, Joint mobilization, Spinal mobilization, Cryotherapy, and Moist heat.  PLAN FOR NEXT SESSION:  Core and hip strength, lumbar and ankle mobility, manual PRN. How did she feel after last session? If still having increased pain, encourage water PT so we can build tolerance to land therapies   Nedra Hai, PT, DPT 05/26/23 11:42 AM

## 2023-05-30 ENCOUNTER — Ambulatory Visit (INDEPENDENT_AMBULATORY_CARE_PROVIDER_SITE_OTHER): Payer: Medicare Other | Admitting: Physical Therapy

## 2023-05-30 ENCOUNTER — Encounter: Payer: Self-pay | Admitting: Physical Therapy

## 2023-05-30 DIAGNOSIS — M6281 Muscle weakness (generalized): Secondary | ICD-10-CM | POA: Diagnosis not present

## 2023-05-30 DIAGNOSIS — R262 Difficulty in walking, not elsewhere classified: Secondary | ICD-10-CM

## 2023-05-30 DIAGNOSIS — M25572 Pain in left ankle and joints of left foot: Secondary | ICD-10-CM | POA: Diagnosis not present

## 2023-05-30 DIAGNOSIS — M5459 Other low back pain: Secondary | ICD-10-CM | POA: Diagnosis not present

## 2023-05-30 NOTE — Therapy (Signed)
OUTPATIENT PHYSICAL THERAPY THORACOLUMBAR TREATMENT   Patient Name: Judith Chavez MRN: [id number] DOB:May 15, 1946, 77 y.o., female Today's Date: 05/30/2023  END OF SESSION:  PT End of Session - 05/30/23 1035     Visit Number 4    Number of Visits 13    Date for PT Re-Evaluation 06/16/23    Authorization Type MCR, [place]IP, BCBS [place]    Authorization Time Period 05/05/23 to 06/30/23    Progress Note Due on Visit 10    PT Start Time 1027   MHP not included in billing/session very pain limited   PT Stop Time 1056    PT Time Calculation (min) 29 min    Activity Tolerance Patient tolerated treatment well;Patient limited by pain    Behavior During Therapy [facility] for tasks assessed/performed;Flat affect                Past Medical History:  Diagnosis Date   Anemia    as a teenager   Anxiety    Arthritis    knees   Cancer (HCC) 03/2020   right breast DCIS, LCIS   Depression    Diabetes mellitus without complication (HCC)    Type 2   Family history of breast cancer 02/28/2020   Headache    migraines   History of pneumonia    Numbness    "left foot"    Pneumonia    Poor circulation    Past Surgical History:  Procedure Laterality Date   ABCESS DRAINAGE     "after cesarean- in hospital for 3 months"   ABDOMINAL HYSTERECTOMY     BACK SURGERY     CESAREAN SECTION  07/11/1968   CHOLECYSTECTOMY     COLONOSCOPY     DILATION AND CURETTAGE OF UTERUS     EYE SURGERY Right    MASTECTOMY     MENISCUS REPAIR Bilateral    SIMPLE MASTECTOMY WITH AXILLARY SENTINEL NODE BIOPSY Right 04/13/2020   Procedure: RIGHT MASTECTOMY WITH RIGHT AXILLARY SENTINEL NODE BIOPSY;  Surgeon: Abigail Miyamoto, MD;  Location: [facility];  Service: General;  Laterality: Right;   SPINE SURGERY     TOTAL KNEE ARTHROPLASTY Right 02/22/2021   Procedure: RIGHT TOTAL KNEE ARTHROPLASTY;  Surgeon: Tarry Kos, MD;  Location: [facility];  Service: Orthopedics;  Laterality: Right;   Patient  Active Problem List   Diagnosis Date Noted   Status post total right knee replacement 02/22/2021   History of right breast cancer 04/13/2020   Genetic testing 03/10/2020   Family history of breast cancer 02/28/2020   Ductal carcinoma in situ (DCIS) of right breast 02/26/2020   Primary osteoarthritis of right knee 03/20/2019   Primary osteoarthritis of left knee 03/20/2019   Primary osteoarthritis of both knees 04/24/2017   Spondylolisthesis at L3-L4 level 06/01/2015    PCP: Lupita Raider MD   REFERRING PROVIDER: Tarry Kos, MD  REFERRING DIAG: M54.50 (ICD-10-CM) - Low back pain, unspecified back pain laterality, unspecified chronicity, unspecified whether sciatica present M25.572 (ICD-10-CM) - Pain in left ankle and joints of left foot  Rationale for Evaluation and Treatment: Rehabilitation  THERAPY DIAG:  Other low back pain  Muscle weakness (generalized)  Pain in left ankle and joints of left foot  Difficulty in walking, not elsewhere classified  ONSET DATE: 2016 back surgery, pain returned 2024  SUBJECTIVE:  SUBJECTIVE STATEMENT:  Back has been worse. Its been bad sitting, walking, everything on the right side. Felt OK after last visit. Couldn't go to church on Sunday, couldn't get out of bed just got on a heating pad. Later jacuzzi with salts didn't help either, just laid around all day. Husband is going out for knee replacement on the 25th, I will need to help her so I cancelled some appointments so I can be home. Pain in R side of back is new. Ankle is better but its still swollen.      EVAL: Had back surgery in 2016 with Dr. Wynetta Emery, then this year it came back. Started getting injections for osteoporosis in August this year and started having more aches and pains. My left ankle swells  and the pain comes up the leg from my ankle, I get a lot of leg cramps at night. My back is very painful, Dr. Roda Shutters recommended that I contact Dr. Wynetta Emery again, I see my PCP soon and would like to talk to her first. My back was terrible yesterday but its better today. Ankle is still sore it was swollen this morning.   PERTINENT HISTORY:  Anxiety, knee OA, breast CA, depression, DM, back surgery, meniscus repair, R TKA 2022  PAIN:  Are you having pain? Yes: NPRS scale: "max its up there 10+"/10 Pain location: R side of back going down into R LE  Pain description: something is just pressing and just hurts, tender, sore   Aggravating factors: walking/sitting/movement/laying down/everything  Relieving factors: nothing   PRECAUTIONS: None  RED FLAGS: None   WEIGHT BEARING RESTRICTIONS: No  FALLS:  Has patient fallen in last 6 months? No  LIVING ENVIRONMENT: Lives with: lives with their spouse Lives in: House/apartment Stairs:  has upstairs but does not have to use it    OCCUPATION: retired   PLOF: Independent, Independent with basic ADLs, Independent with gait, and Independent with transfers  PATIENT GOALS: less pain   NEXT MD VISIT: referring PRN Lew Dawes PT   OBJECTIVE:  Note: Objective measures were completed at Evaluation unless otherwise noted.  DIAGNOSTIC FINDINGS:  X-rays of the lumbar spine shows prior fusion of L3-4 and 4 5.  There is  adjacent level disease with disc space narrowing.  X-rays of the left ankle show no acute abnormalities.  Mild  osteoarthritis.  Mild spurring of the medial malleolus.  Summary:  RIGHT:  - There is no evidence of deep vein thrombosis in the lower extremity.    - No cystic structure found in the popliteal fossa.    LEFT:  - No evidence of common femoral vein obstruction.     PATIENT SURVEYS:  FOTO 45 predicted 46 in 14 visits     COGNITION: Overall cognitive status: Within functional limits for tasks assessed and emotionally  labile         POSTURE: rounded shoulders, forward head, decreased lumbar lordosis, decreased thoracic kyphosis, and flexed trunk   PALPATION: L peroneals TTP   LUMBAR ROM:   AROM eval  Flexion Mild limitation, increased pain deferred RFIS   Extension Hypermobile, sharp pain deferred REIS  Right lateral flexion Moderate limitation sharp pain   Left lateral flexion WNL   Right rotation Moderate limitation   Left rotation Moderate limitation    (Blank rows = not tested)  LOWER EXTREMITY ROM:     Active  Right eval Left eval  Hip flexion    Hip extension    Hip abduction  Hip adduction    Hip internal rotation    Hip external rotation    Knee flexion    Knee extension    Ankle dorsiflexion  9*  Ankle plantarflexion  48*  Ankle inversion  23*  Ankle eversion  -6* (unable to actively get to neutral inversion/eversion)   (Blank rows = not tested)  LOWER EXTREMITY MMT:    MMT Right eval Left eval  Hip flexion 3 sudden drop  3 sudden drop  Hip extension    Hip abduction 3- 3  Hip adduction    Hip internal rotation    Hip external rotation    Knee flexion 4+ 3  Knee extension 4+ 4-  Ankle dorsiflexion 3+ 3  Ankle plantarflexion    Ankle inversion 4 4  Ankle eversion 4 3-   (Blank rows = not tested)    GAIT: Distance walked: in clinic distances  Assistive device utilized: None Level of assistance: Complete Independence Comments: antalgic, favors L LE, back very stiff   TODAY'S TREATMENT:                                                                                                                              DATE:   05/30/23  MHP Lx spine in supine x10 minutes not included in billing   TherEx  TA sets seated 15x3 second holds Seated marches + TA set x10 B Attempted seated figure 4 stretch unable to tolerate Forward QL stretch 3x30 seconds QL stretch for R side 2x30 seconds     Education  Discussed PT concerns about pain patterns and  severity, also pain changing locations and being quite debilitating and progressive limited exercise tolerance in PT sessions, PT recommendations for holding PT/ return to MD also potential MRI given worsening of pain and inconsistent location thereof    05/26/23  TherEx   Bridges x8 TA sets 15x3 second holds (2 rounds) Hamstring isometrics 10x3 seconds B PPT 15x3 seconds holds Quad sets 10x2 seconds B  Seated TA sets + march x10 B Seated clams red TB x15    05/23/23  TherEx  LAQs red TB x15 L LE  HS red TB x15 L LE  Seated hip abduction with red TB x15 Seated marches red TB x10 B Piriformis stretch 2x30 seconds B TA set + seated march x15 STS with 4 inch box under RLE for increased WB L x7 BUEs  Hip hikes x15 B  Hip hike + ABD x10 B cues for sequencing     MHP on Lx spine during initial seated exercises x10 minutes, PT provided direct supervision and monitoring     05/05/23- eval, HEP practice, appropriate education    PATIENT EDUCATION:  Education details: exam findings, POC, HEP, encouraged exercise on reclining bike at home as long as it does not increase pain, role of exercise in managing depression and chronic pain  Person educated: Patient Education method: Explanation, Demonstration, and Handouts Education comprehension:  verbalized understanding, returned demonstration, and needs further education  HOME EXERCISE PROGRAM:  Access Code: [email or web address] URL: [email or web address] Date: 05/23/2023 Prepared by: Nedra Hai  Exercises - Supine Lower Trunk Rotation  - 2 x daily - 7 x weekly - 1 sets - 10 reps - 3 seconds  hold - Supine Posterior Pelvic Tilt  - 2 x daily - 7 x weekly - 1 sets - 15 reps - 3 seconds  hold - Seated Ankle Alphabet  - 2 x daily - 7 x weekly - 1 sets - 2 reps - Seated Quadratus Lumborum Stretch in Chair  - 2 x daily - 7 x weekly - 1 sets - 3 reps - 30 seconds  hold - Seated Hip Abduction with Resistance  - 1 x daily - 4 x  weekly - 2 sets - 10 reps - 2 seconds  hold - Seated March with Resistance  - 1 x daily - 4 x weekly - 2 sets - 10 reps - 1 second  hold    ASSESSMENT:  CLINICAL IMPRESSION:  Pt again returns with high pain levels now on R side and going into R leg today. We tried some MHP as pain was very high today and really limited functional activity tolerance, otherwise focused on core work. Unsure if PT has been beneficial so far, also her visits are limited as her spouse is having surgery soon and she would like to be able to focus on caring for him. May benefit from return to MD given ongoing severity of pain and varying response to interventions thus far, discussed this today. MRI may also be helpful here to r/o anything more insidious given current pain patterns and how pain has been getting more severe and changing location as well- discussed this with patient today, will reach out to referring MD via epic secure chat.     EVAL: Patient is a 77 y.o. F who was seen today for physical therapy evaluation and treatment for M54.50 (ICD-10-CM) - Low back pain, unspecified back pain laterality, unspecified chronicity, unspecified whether sciatica present M25.572 (ICD-10-CM) - Pain in left ankle and joints of left foot. Exam with objective findings as above. Will benefit from skilled PT services to address pain, sounds like she might have an upcoming trip coming up which we will plan to work around.    OBJECTIVE IMPAIRMENTS: Abnormal gait, difficulty walking, decreased ROM, decreased strength, impaired perceived functional ability, increased muscle spasms, impaired flexibility, improper body mechanics, postural dysfunction, obesity, and pain.   ACTIVITY LIMITATIONS: carrying, lifting, bending, sitting, standing, squatting, sleeping, stairs, transfers, bed mobility, and locomotion level  PARTICIPATION LIMITATIONS: meal prep, cleaning, laundry, driving, shopping, community activity, and yard work  PERSONAL  FACTORS: Age, Behavior pattern, Fitness, Past/current experiences, Social background, and Time since onset of injury/illness/exacerbation are also affecting patient's functional outcome.   REHAB POTENTIAL: Fair psychosocial factors, chronicity of pain   CLINICAL DECISION MAKING: Stable/uncomplicated  EVALUATION COMPLEXITY: Low   GOALS: Goals reviewed with patient? No  SHORT TERM GOALS: Target date: 05/26/2023    Will be compliant with appropriate progressive HEP  Baseline: Goal status: INITIAL  2.  Pain in L ankle and low back to be no more than 6/10 at worst  Baseline:  Goal status: INITIAL  3.  Will demonstrate improved functional biomechanics to assist in avoiding pain exacerbations with movement  Baseline:  Goal status: INITIAL  4.  Lumbar and L ankle ROM to be WNL  Baseline:  Goal status: INITIAL  LONG TERM GOALS: Target date: 06/16/2023    MMT to improve by at least 1 grade all tested groups  Baseline:  Goal status: INITIAL  2.  Low back and L ankle pain to be no more than 4/10 at worst  Baseline:  Goal status: INITIAL  3.  Will be able to perform all housework and desired functional tasks at home (such as getting in/out of bath and jacuzzi) without increased pain levels  Baseline:  Goal status: INITIAL  4.  Will have been able to return to desired exercise program independently without increase in pain  Baseline:  Goal status: INITIAL  5.  FOTO score to be at goal level or higher by time of DC Baseline:  Goal status: INITIAL    PLAN:  PT FREQUENCY: 2x/week  PT DURATION: 6 weeks  PLANNED INTERVENTIONS: 97164- PT Re-evaluation, 97110-Therapeutic exercises, 97530- Therapeutic activity, 97112- Neuromuscular re-education, 97535- Self Care, 16109- Manual therapy, 97014- Electrical stimulation (unattended), 97035- Ultrasound, 60454- Traction (mechanical), Patient/Family education, Joint mobilization, Spinal mobilization, Cryotherapy, and Moist  heat.  PLAN FOR NEXT SESSION:  on hold pending MRI   Nedra Hai, PT, DPT 05/30/23 11:01 AM

## 2023-05-31 ENCOUNTER — Encounter: Payer: Self-pay | Admitting: Physical Therapy

## 2023-06-02 ENCOUNTER — Encounter: Payer: Self-pay | Admitting: Physical Therapy

## 2023-06-02 ENCOUNTER — Encounter: Payer: Medicare Other | Admitting: Physical Therapy

## 2023-06-05 ENCOUNTER — Encounter: Payer: Self-pay | Admitting: Physical Therapy

## 2023-06-06 ENCOUNTER — Encounter: Payer: Medicare Other | Admitting: Physical Therapy

## 2023-06-13 ENCOUNTER — Encounter: Payer: Medicare Other | Admitting: Physical Therapy

## 2023-06-20 ENCOUNTER — Other Ambulatory Visit (HOSPITAL_BASED_OUTPATIENT_CLINIC_OR_DEPARTMENT_OTHER): Payer: Self-pay

## 2023-06-28 DIAGNOSIS — Z23 Encounter for immunization: Secondary | ICD-10-CM | POA: Diagnosis not present

## 2023-06-28 DIAGNOSIS — M549 Dorsalgia, unspecified: Secondary | ICD-10-CM | POA: Diagnosis not present

## 2023-06-28 DIAGNOSIS — Z0000 Encounter for general adult medical examination without abnormal findings: Secondary | ICD-10-CM | POA: Diagnosis not present

## 2023-06-28 DIAGNOSIS — G4700 Insomnia, unspecified: Secondary | ICD-10-CM | POA: Diagnosis not present

## 2023-06-28 DIAGNOSIS — M8580 Other specified disorders of bone density and structure, unspecified site: Secondary | ICD-10-CM | POA: Diagnosis not present

## 2023-06-28 DIAGNOSIS — A6000 Herpesviral infection of urogenital system, unspecified: Secondary | ICD-10-CM | POA: Diagnosis not present

## 2023-06-28 DIAGNOSIS — Z853 Personal history of malignant neoplasm of breast: Secondary | ICD-10-CM | POA: Diagnosis not present

## 2023-06-28 DIAGNOSIS — F3342 Major depressive disorder, recurrent, in full remission: Secondary | ICD-10-CM | POA: Diagnosis not present

## 2023-06-28 DIAGNOSIS — E1169 Type 2 diabetes mellitus with other specified complication: Secondary | ICD-10-CM | POA: Diagnosis not present

## 2023-06-28 DIAGNOSIS — M1990 Unspecified osteoarthritis, unspecified site: Secondary | ICD-10-CM | POA: Diagnosis not present

## 2023-06-29 ENCOUNTER — Ambulatory Visit: Payer: Medicare Other | Admitting: Physician Assistant

## 2023-06-29 ENCOUNTER — Encounter: Payer: Self-pay | Admitting: Physician Assistant

## 2023-06-29 ENCOUNTER — Other Ambulatory Visit: Payer: Self-pay

## 2023-06-29 DIAGNOSIS — M25512 Pain in left shoulder: Secondary | ICD-10-CM

## 2023-06-29 MED ORDER — LIDOCAINE HCL 1 % IJ SOLN
3.0000 mL | INTRAMUSCULAR | Status: AC | PRN
Start: 2023-06-29 — End: 2023-06-29
  Administered 2023-06-29: 3 mL

## 2023-06-29 MED ORDER — METHYLPREDNISOLONE ACETATE 40 MG/ML IJ SUSP
40.0000 mg | INTRAMUSCULAR | Status: AC | PRN
Start: 2023-06-29 — End: 2023-06-29
  Administered 2023-06-29: 40 mg via INTRA_ARTICULAR

## 2023-06-29 NOTE — Progress Notes (Signed)
Office Visit Note   Patient: Judith Chavez           Date of Birth: 13-Mar-1946           MRN: [id number] Visit Date: 06/29/2023              Requested by: Lupita Raider, MD [address] [place],  [place] [address] PCP: Lupita Raider, MD  Chief Complaint  Patient presents with   Left Shoulder - Pain      HPI: Judith Chavez is a pleasant 77 year old woman with a 1 month history of left shoulder pain.  She denies any previous history or any particular injury.  Hurts with overhead motion and internal rotation behind her back.  She denies any fever or chills  Assessment & Plan: Visit Diagnoses:  1. Acute pain of left shoulder     Plan: Findings consistent with rotator cuff tendinitis.  She has had steroid injections into her knee before and done quite well after this her today.  If she does not improve could consider physical therapy she is a diabetic but has fairly well-controlled blood sugars  Follow-Up Instructions: No follow-ups on file.   Ortho Exam  Patient is alert, oriented, no adenopathy, well-dressed, normal affect, normal respiratory effort. Examination of her left shoulder she is neurovascular intact she has full forward elevation though painful painful internal rotation behind her back good grip strength no radicular findings no neck pain she has a positive empty can test no pain with external rotation strength is intact  Imaging: No results found. No images are attached to the encounter.  Labs: Lab Results  Component Value Date   HGBA1C 7.4 (H) 02/15/2021   HGBA1C 7.3 (H) 11/25/2020   HGBA1C (H) 03/05/2010    6.1 (NOTE)                                                                       According to the ADA Clinical Practice Recommendations for 2011, when HbA1c is used as a screening test:   >=6.5%   Diagnostic of Diabetes Mellitus           (if abnormal result  is confirmed)  5.7-6.4%   Increased risk of developing Diabetes Mellitus   References:Diagnosis and Classification of Diabetes Mellitus,Diabetes Care,2011,34(Suppl 1):S62-S69 and Standards of Medical Care in         Diabetes - 2011,Diabetes Care,2011,34  (Suppl 1):S11-S61.   REPTSTATUS 06/13/2015 FINAL 06/11/2015   CULT 60,000 COLONIES/ml PROTEUS MIRABILIS 06/11/2015   LABORGA PROTEUS MIRABILIS 06/11/2015     Lab Results  Component Value Date   ALBUMIN 4.0 07/18/2022   ALBUMIN 4.1 12/28/2021   ALBUMIN 3.7 06/09/2021    No results found for: "MG" No results found for: "VD25OH"  No results found for: "PREALBUMIN"    Latest Ref Rng & Units 07/18/2022   12:48 PM 12/28/2021   11:50 AM 06/09/2021    1:55 PM  CBC EXTENDED  WBC 4.0 - 10.5 K/uL 5.7  5.6  6.5   RBC 3.87 - 5.11 MIL/uL 4.77  4.94  4.93   Hemoglobin 12.0 - 15.0 g/dL 19.1  47.8  29.5   HCT 36.0 - 46.0 % 40.3  41.9  40.4   Platelets  150 - 400 K/uL 182  207  231   NEUT# 1.7 - 7.7 K/uL 2.7  2.9  3.3   Lymph# 0.7 - 4.0 K/uL 2.2  2.1  2.3      There is no height or weight on file to calculate BMI.  Orders:  Orders Placed This Encounter  Procedures   XR Shoulder Left   No orders of the defined types were placed in this encounter.    Procedures: Large Joint Inj: L subacromial bursa on 06/29/2023 9:05 AM Indications: diagnostic evaluation and pain Details: 25 G 1.5 in needle, posterior approach  Arthrogram: No  Medications: 3 mL lidocaine 1 %; 40 mg methylPREDNISolone acetate 40 MG/ML Outcome: tolerated well, no immediate complications Procedure, treatment alternatives, risks and benefits explained, specific risks discussed. Consent was given by the patient.      Clinical Data: No additional findings.  ROS:  All other systems negative, except as noted in the HPI. Review of Systems  Objective: Vital Signs: There were no vitals taken for this visit.  Specialty Comments:  No specialty comments available.  PMFS History: Patient Active Problem List   Diagnosis Date Noted   Pain  in left shoulder 06/29/2023   Status post total right knee replacement 02/22/2021   History of right breast cancer 04/13/2020   Genetic testing 03/10/2020   Family history of breast cancer 02/28/2020   Ductal carcinoma in situ (DCIS) of right breast 02/26/2020   Primary osteoarthritis of right knee 03/20/2019   Primary osteoarthritis of left knee 03/20/2019   Primary osteoarthritis of both knees 04/24/2017   Spondylolisthesis at L3-L4 level 06/01/2015   Past Medical History:  Diagnosis Date   Anemia    as a teenager   Anxiety    Arthritis    knees   Cancer (HCC) 03/2020   right breast DCIS, LCIS   Depression    Diabetes mellitus without complication (HCC)    Type 2   Family history of breast cancer 02/28/2020   Headache    migraines   History of pneumonia    Numbness    "left foot"    Pneumonia    Poor circulation     Family History  Problem Relation Age of Onset   Breast cancer Cousin        maternal; dx and d. <50   Breast cancer Cousin        maternal; dx and d. <50   Breast cancer Cousin        maternal; dx and d. <50   Breast cancer Cousin        maternal; dx and d. <50   Cancer Paternal Aunt        unknown type cancer dx 29s    Past Surgical History:  Procedure Laterality Date   ABCESS DRAINAGE     "after cesarean- in hospital for 3 months"   ABDOMINAL HYSTERECTOMY     BACK SURGERY     CESAREAN SECTION  07/11/1968   CHOLECYSTECTOMY     COLONOSCOPY     DILATION AND CURETTAGE OF UTERUS     EYE SURGERY Right    MASTECTOMY     MENISCUS REPAIR Bilateral    SIMPLE MASTECTOMY WITH AXILLARY SENTINEL NODE BIOPSY Right 04/13/2020   Procedure: RIGHT MASTECTOMY WITH RIGHT AXILLARY SENTINEL NODE BIOPSY;  Surgeon: Abigail Miyamoto, MD;  Location: [facility];  Service: General;  Laterality: Right;   SPINE SURGERY     TOTAL KNEE ARTHROPLASTY Right 02/22/2021  Procedure: RIGHT TOTAL KNEE ARTHROPLASTY;  Surgeon: Tarry Kos, MD;  Location: [facility];   Service: Orthopedics;  Laterality: Right;   Social History   Occupational History   Occupation: retired   Tobacco Use   Smoking status: Former    Current packs/day: 0.00    Average packs/day: 0.5 packs/day for 10.0 years (5.0 ttl pk-yrs)    Types: Cigarettes    Start date: 07/11/1976    Quit date: 07/11/1986    Years since quitting: 36.9   Smokeless tobacco: Never  Vaping Use   Vaping status: Never Used  Substance and Sexual Activity   Alcohol use: Yes    Comment: occassionally   Drug use: No   Sexual activity: Not on file

## 2023-07-11 ENCOUNTER — Encounter: Payer: Self-pay | Admitting: Physical Therapy

## 2023-07-11 NOTE — Therapy (Signed)
 PHYSICAL THERAPY DISCHARGE SUMMARY  Visits from Start of Care: 4  Current functional level related to goals / functional outcomes: Has not returned in >30 days, DC per [facility] policy    Remaining deficits: N/A    Education / Equipment: N/A   Patient agrees to discharge. Patient goals were not met. Patient is being discharged due to not returning since the last visit.  Josette Rough, PT, DPT 07/11/23 12:23 PM

## 2023-07-25 DIAGNOSIS — M5440 Lumbago with sciatica, unspecified side: Secondary | ICD-10-CM | POA: Diagnosis not present

## 2023-08-01 DIAGNOSIS — M810 Age-related osteoporosis without current pathological fracture: Secondary | ICD-10-CM | POA: Diagnosis not present

## 2023-08-17 DIAGNOSIS — M48061 Spinal stenosis, lumbar region without neurogenic claudication: Secondary | ICD-10-CM | POA: Diagnosis not present

## 2023-08-17 DIAGNOSIS — M47816 Spondylosis without myelopathy or radiculopathy, lumbar region: Secondary | ICD-10-CM | POA: Diagnosis not present

## 2023-08-17 DIAGNOSIS — M5127 Other intervertebral disc displacement, lumbosacral region: Secondary | ICD-10-CM | POA: Diagnosis not present

## 2023-08-17 DIAGNOSIS — M47817 Spondylosis without myelopathy or radiculopathy, lumbosacral region: Secondary | ICD-10-CM | POA: Diagnosis not present

## 2023-08-17 DIAGNOSIS — M5440 Lumbago with sciatica, unspecified side: Secondary | ICD-10-CM | POA: Diagnosis not present

## 2023-09-04 DIAGNOSIS — M810 Age-related osteoporosis without current pathological fracture: Secondary | ICD-10-CM | POA: Diagnosis not present

## 2023-09-05 DIAGNOSIS — M5440 Lumbago with sciatica, unspecified side: Secondary | ICD-10-CM | POA: Diagnosis not present

## 2023-09-18 ENCOUNTER — Other Ambulatory Visit (HOSPITAL_BASED_OUTPATIENT_CLINIC_OR_DEPARTMENT_OTHER): Payer: Self-pay

## 2023-09-18 DIAGNOSIS — M5416 Radiculopathy, lumbar region: Secondary | ICD-10-CM | POA: Diagnosis not present

## 2023-10-04 DIAGNOSIS — M810 Age-related osteoporosis without current pathological fracture: Secondary | ICD-10-CM | POA: Diagnosis not present

## 2023-10-08 DIAGNOSIS — J309 Allergic rhinitis, unspecified: Secondary | ICD-10-CM | POA: Diagnosis not present

## 2023-10-08 DIAGNOSIS — M5481 Occipital neuralgia: Secondary | ICD-10-CM | POA: Diagnosis not present

## 2023-10-09 DIAGNOSIS — R110 Nausea: Secondary | ICD-10-CM | POA: Diagnosis not present

## 2023-10-09 DIAGNOSIS — R519 Headache, unspecified: Secondary | ICD-10-CM | POA: Diagnosis not present

## 2023-11-06 DIAGNOSIS — M810 Age-related osteoporosis without current pathological fracture: Secondary | ICD-10-CM | POA: Diagnosis not present

## 2023-11-16 DIAGNOSIS — M5416 Radiculopathy, lumbar region: Secondary | ICD-10-CM | POA: Diagnosis not present

## 2023-12-07 DIAGNOSIS — M810 Age-related osteoporosis without current pathological fracture: Secondary | ICD-10-CM | POA: Diagnosis not present

## 2023-12-09 ENCOUNTER — Other Ambulatory Visit (HOSPITAL_BASED_OUTPATIENT_CLINIC_OR_DEPARTMENT_OTHER): Payer: Self-pay

## 2023-12-27 DIAGNOSIS — R5383 Other fatigue: Secondary | ICD-10-CM | POA: Diagnosis not present

## 2023-12-27 DIAGNOSIS — E1169 Type 2 diabetes mellitus with other specified complication: Secondary | ICD-10-CM | POA: Diagnosis not present

## 2024-01-08 DIAGNOSIS — M810 Age-related osteoporosis without current pathological fracture: Secondary | ICD-10-CM | POA: Diagnosis not present

## 2024-01-10 ENCOUNTER — Other Ambulatory Visit: Payer: Self-pay | Admitting: Hematology

## 2024-01-10 DIAGNOSIS — Z1231 Encounter for screening mammogram for malignant neoplasm of breast: Secondary | ICD-10-CM

## 2024-01-18 ENCOUNTER — Telehealth: Payer: Self-pay

## 2024-01-18 NOTE — Telephone Encounter (Signed)
 Patient called and would like to be worked into Dr. Benjiman schedule for her left knee.  CB# [phone].  Please advise.  Thank you.

## 2024-01-18 NOTE — Telephone Encounter (Signed)
 Patient called back. She will see Judith Chavez tomorrow at [facility] for her left knee.

## 2024-01-18 NOTE — Telephone Encounter (Signed)
 See message below. Do you want to work her in tomorrow?

## 2024-01-19 ENCOUNTER — Encounter (HOSPITAL_BASED_OUTPATIENT_CLINIC_OR_DEPARTMENT_OTHER): Payer: Self-pay | Admitting: Student

## 2024-01-19 ENCOUNTER — Ambulatory Visit (INDEPENDENT_AMBULATORY_CARE_PROVIDER_SITE_OTHER): Admitting: Student

## 2024-01-19 ENCOUNTER — Ambulatory Visit (HOSPITAL_BASED_OUTPATIENT_CLINIC_OR_DEPARTMENT_OTHER)

## 2024-01-19 DIAGNOSIS — M1712 Unilateral primary osteoarthritis, left knee: Secondary | ICD-10-CM

## 2024-01-19 DIAGNOSIS — M25562 Pain in left knee: Secondary | ICD-10-CM

## 2024-01-19 NOTE — Progress Notes (Unsigned)
 Chief Complaint: Left knee pain     History of Present Illness:    Judith Chavez is a 78 y.o. female with known history of left knee osteoarthritis who presents today for evaluation of left knee pain.  Patient reports that 2 days ago she began to develop aching and swelling in the knee.  She does note history of a Baker's cyst and feels like she may have had this rupture recently.  Patient was last seen for the left knee in April 2024 by Dr. Jerri and received a cortisone injection which did give her good relief.  She has tried icing the knee but denies use of any pain medication.  Has history of a right total knee arthroplasty which is overall doing well.   Surgical History:   Right knee TKA 2022  PMH/PSH/Family History/Social History/Meds/Allergies:    Past Medical History:  Diagnosis Date   Anemia    as a teenager   Anxiety    Arthritis    knees   Cancer (HCC) 03/2020   right breast DCIS, LCIS   Depression    Diabetes mellitus without complication (HCC)    Type 2   Family history of breast cancer 02/28/2020   Headache    migraines   History of pneumonia    Numbness    left foot    Pneumonia    Poor circulation    Past Surgical History:  Procedure Laterality Date   ABCESS DRAINAGE     after cesarean- in hospital for 3 months   ABDOMINAL HYSTERECTOMY     BACK SURGERY     CESAREAN SECTION  07/11/1968   CHOLECYSTECTOMY     COLONOSCOPY     DILATION AND CURETTAGE OF UTERUS     EYE SURGERY Right    MASTECTOMY     MENISCUS REPAIR Bilateral    SIMPLE MASTECTOMY WITH AXILLARY SENTINEL NODE BIOPSY Right 04/13/2020   Procedure: RIGHT MASTECTOMY WITH RIGHT AXILLARY SENTINEL NODE BIOPSY;  Surgeon: Vernetta Berg, MD;  Location: [facility];  Service: General;  Laterality: Right;   SPINE SURGERY     TOTAL KNEE ARTHROPLASTY Right 02/22/2021   Procedure: RIGHT TOTAL KNEE ARTHROPLASTY;  Surgeon: Jerri Kay HERO, MD;  Location:  [facility] OR;  Service: Orthopedics;  Laterality: Right;   Social History   Socioeconomic History   Marital status: Married    Spouse name: Not on file   Number of children: 1   Years of education: Not on file   Highest education level: Not on file  Occupational History   Occupation: retired   Tobacco Use   Smoking status: Former    Current packs/day: 0.00    Average packs/day: 0.5 packs/day for 10.0 years (5.0 ttl pk-yrs)    Types: Cigarettes    Start date: 07/11/1976    Quit date: 07/11/1986    Years since quitting: 37.5   Smokeless tobacco: Never  Vaping Use   Vaping status: Never Used  Substance and Sexual Activity   Alcohol  use: Yes    Comment: occassionally   Drug use: No   Sexual activity: Not on file  Other Topics Concern   Not on file  Social History Narrative   Not on file   Social Drivers of Health   Financial Resource Strain: Not on file  Food  Insecurity: Not on file  Transportation Needs: Not on file  Physical Activity: Not on file  Stress: Not on file  Social Connections: Not on file   Family History  Problem Relation Age of Onset   Breast cancer Cousin        maternal; dx and d. <50   Breast cancer Cousin        maternal; dx and d. <50   Breast cancer Cousin        maternal; dx and d. <50   Breast cancer Cousin        maternal; dx and d. <50   Cancer Paternal Aunt        unknown type cancer dx 23s   No Known Allergies Current Outpatient Medications  Medication Sig Dispense Refill   acetaminophen  (TYLENOL  8 HOUR) 650 MG CR tablet Take 1 tablet (650 mg total) by mouth every 8 (eight) hours as needed for pain. 60 tablet 0   Cholecalciferol (DIALYVITE VITAMIN D 5000) 125 MCG (5000 UT) capsule Take 5,000 Units by mouth daily.     HYDROcodone  bit-homatropine (HYCODAN) 5-1.5 MG/5ML syrup Take 5 mLs by mouth every 6 (six) hours as needed 1 hour prior to meals. 120 mL 0   ibuprofen (ADVIL) 200 MG tablet Take 200 mg by mouth every 6 (six) hours as needed for  headache or cramping.     influenza vaccine adjuvanted (FLUAD) 0.5 ML injection Inject into the muscle. 0.5 mL 0   NOVOFINE 32G X 6 MM MISC AS DIRECTED ONCE A DAY WITH TRESIBA FLEXTOUCH     ONETOUCH DELICA LANCETS 33G MISC 2 (two) times daily. for testing  2   ONETOUCH VERIO test strip USE AS DIRECTED TO TEST BLOOD SUGAR TWICE A DAY  3   predniSONE  (STERAPRED UNI-PAK 21 TAB) 10 MG (21) TBPK tablet Take as directed 21 tablet 3   Semaglutide , 2 MG/DOSE, (OZEMPIC , 2 MG/DOSE,) 8 MG/3ML SOPN Inject 2 m into the skin every 7 (seven) days. 3 mL 1   Semaglutide , 2 MG/DOSE, (OZEMPIC , 2 MG/DOSE,) 8 MG/3ML SOPN Inject 2 mg into the skin once a week. 3 mL 11   sitaGLIPtin (JANUVIA) 100 MG tablet Take 100 mg by mouth daily.     TRESIBA FLEXTOUCH 100 UNIT/ML SOPN FlexTouch Pen Inject 30 Units into the skin daily.     No current facility-administered medications for this visit.   No results found.  Review of Systems:   A ROS was performed including pertinent positives and negatives as documented in the HPI.  Physical Exam :   Constitutional: NAD and appears stated age Neurological: Alert and oriented Psych: Appropriate affect and cooperative There were no vitals taken for this visit.   Comprehensive Musculoskeletal Exam:    Left knee exam demonstrates active range of motion from 0 to 110 degrees with palpable crepitus.  Positive for medial joint line tenderness.  Baker's cyst palpable in the posterior knee.  Stable collaterals with varus and valgus stress.  No overlying erythema or warmth.  Imaging:   Xray (left knee 4 views): Moderate tricompartmental osteoarthritis with diffuse osteophytes   I personally reviewed and interpreted the radiographs.   Assessment:   78 y.o. female with osteoarthritis of the left knee.  This does appear at least moderate with tricompartmental changes noted, although there is some joint space maintained.  There is a palpable Baker's cyst in the posterior knee  although not likely that this is extremely symptomatic.  No significant effusion present on exam  and ultrasound evaluation.  I do think she would benefit from a repeat cortisone injection as it has been over 1 year since she received this last.  Patient is agreeable and injection was performed into the left knee which she tolerated well.  Plan to have her return as needed and continue following with Dr. Jerri for further management.  Plan :    - Left knee cortisone injection performed today - Continue following with Dr. Jerri for further evaluation and management    Procedure Note  Patient: Tatisha Cerino             Date of Birth: [birth year]           MRN: [id number]             Visit Date: 01/19/2024  Procedures: Visit Diagnoses:  1. Unilateral primary osteoarthritis, left knee     Large Joint Inj: L knee on 01/19/2024 2:40 PM Indications: pain Details: 22 G 1.5 in needle, anterolateral approach Medications: 4 mL lidocaine  1 %; 2 mL triamcinolone  acetonide 40 MG/ML Outcome: tolerated well, no immediate complications Procedure, treatment alternatives, risks and benefits explained, specific risks discussed. Consent was given by the patient. Immediately prior to procedure a time out was called to verify the correct patient, procedure, equipment, support staff and site/side marked as required. Patient was prepped and draped in the usual sterile fashion.       I personally saw and evaluated the patient, and participated in the management and treatment plan.  Leonce Reveal, PA-C Orthopedics

## 2024-01-20 MED ORDER — LIDOCAINE HCL 1 % IJ SOLN
4.0000 mL | INTRAMUSCULAR | Status: AC | PRN
Start: 2024-01-19 — End: 2024-01-19
  Administered 2024-01-19: 4 mL

## 2024-01-20 MED ORDER — TRIAMCINOLONE ACETONIDE 40 MG/ML IJ SUSP
2.0000 mL | INTRAMUSCULAR | Status: AC | PRN
Start: 2024-01-19 — End: 2024-01-19
  Administered 2024-01-19: 2 mL via INTRA_ARTICULAR

## 2024-01-26 ENCOUNTER — Encounter: Payer: Self-pay | Admitting: Advanced Practice Midwife

## 2024-01-30 ENCOUNTER — Emergency Department (HOSPITAL_COMMUNITY)

## 2024-01-30 ENCOUNTER — Encounter (HOSPITAL_COMMUNITY): Payer: Self-pay

## 2024-01-30 ENCOUNTER — Emergency Department (HOSPITAL_COMMUNITY)
Admission: EM | Admit: 2024-01-30 | Discharge: 2024-01-31 | Disposition: A | Discharge status: Home / Self Care | Attending: Emergency Medicine | Admitting: Emergency Medicine

## 2024-01-30 ENCOUNTER — Other Ambulatory Visit: Payer: Self-pay

## 2024-01-30 DIAGNOSIS — R9431 Abnormal electrocardiogram [ECG] [EKG]: Secondary | ICD-10-CM | POA: Diagnosis not present

## 2024-01-30 DIAGNOSIS — I6782 Cerebral ischemia: Secondary | ICD-10-CM | POA: Diagnosis not present

## 2024-01-30 DIAGNOSIS — H538 Other visual disturbances: Secondary | ICD-10-CM | POA: Diagnosis not present

## 2024-01-30 DIAGNOSIS — R110 Nausea: Secondary | ICD-10-CM | POA: Diagnosis not present

## 2024-01-30 DIAGNOSIS — S0003XA Contusion of scalp, initial encounter: Secondary | ICD-10-CM | POA: Insufficient documentation

## 2024-01-30 DIAGNOSIS — R519 Headache, unspecified: Secondary | ICD-10-CM | POA: Diagnosis present

## 2024-01-30 DIAGNOSIS — F0781 Postconcussional syndrome: Secondary | ICD-10-CM | POA: Insufficient documentation

## 2024-01-30 DIAGNOSIS — W11XXXA Fall on and from ladder, initial encounter: Secondary | ICD-10-CM | POA: Diagnosis not present

## 2024-01-30 DIAGNOSIS — W19XXXA Unspecified fall, initial encounter: Secondary | ICD-10-CM | POA: Diagnosis not present

## 2024-01-30 DIAGNOSIS — R270 Ataxia, unspecified: Secondary | ICD-10-CM | POA: Diagnosis not present

## 2024-01-30 DIAGNOSIS — R42 Dizziness and giddiness: Secondary | ICD-10-CM | POA: Diagnosis not present

## 2024-01-30 DIAGNOSIS — S0990XA Unspecified injury of head, initial encounter: Secondary | ICD-10-CM | POA: Diagnosis not present

## 2024-01-30 DIAGNOSIS — Z853 Personal history of malignant neoplasm of breast: Secondary | ICD-10-CM | POA: Insufficient documentation

## 2024-01-30 DIAGNOSIS — E1165 Type 2 diabetes mellitus with hyperglycemia: Secondary | ICD-10-CM | POA: Insufficient documentation

## 2024-01-30 DIAGNOSIS — R4781 Slurred speech: Secondary | ICD-10-CM | POA: Diagnosis not present

## 2024-01-30 DIAGNOSIS — I499 Cardiac arrhythmia, unspecified: Secondary | ICD-10-CM | POA: Diagnosis not present

## 2024-01-30 DIAGNOSIS — S199XXA Unspecified injury of neck, initial encounter: Secondary | ICD-10-CM | POA: Diagnosis not present

## 2024-01-30 NOTE — ED Provider Triage Note (Signed)
 Emergency Medicine Provider Triage Evaluation Note  Judith Chavez , a 78 y.o. female  was evaluated in triage.  Pt complains of headache, some blurred vision, trouble with walking after a fall yesterday.  Patient was hanging a picture, fell over backwards and struck the back of her head.  She is not on blood thinners..  Review of Systems  Positive:  Negative:   Physical Exam  BP 129/77 (BP Location: Left Arm)   Pulse 84   Temp 98.6 F (37 C) (Oral)   Resp 16   Ht 5' 1 (1.549 m)   Wt 69.4 kg   SpO2 97%   BMI 28.91 kg/m  Gen:   Awake, no distress   Resp:  Normal effort  MSK:   Moves extremities without difficulty  Other:  Cranial nerves II through XII intact.  Normal speech.  Gait not assessed.  Medical Decision Making  Medically screening exam initiated at 6:31 PM.  Appropriate orders placed.  Judith Chavez was informed that the remainder of the evaluation will be completed by another provider, this initial triage assessment does not replace that evaluation, and the importance of remaining in the ED until their evaluation is complete.  78 year old female with a fall.  Imaging of the head and neck ordered.  Patient with no neurological deficits on my limited hallway exam.   Mannie Fairy DASEN, DO 01/30/24 1832

## 2024-01-30 NOTE — ED Triage Notes (Addendum)
 Pt was standing on a chair yesterday trying to hang picture. Pt fell back and hit head. C/O woke up with increased slurred speech/ diplopia, and nausea. VSS. Axox4. Pt has hematoma to back of head

## 2024-01-31 ENCOUNTER — Emergency Department (HOSPITAL_COMMUNITY)

## 2024-01-31 DIAGNOSIS — S0003XA Contusion of scalp, initial encounter: Secondary | ICD-10-CM | POA: Diagnosis not present

## 2024-01-31 DIAGNOSIS — R29818 Other symptoms and signs involving the nervous system: Secondary | ICD-10-CM | POA: Diagnosis not present

## 2024-01-31 LAB — CBC WITH DIFFERENTIAL/PLATELET
Abs Immature Granulocytes: 0.05 K/uL (ref 0.00–0.07)
Basophils Absolute: 0.1 K/uL (ref 0.0–0.1)
Basophils Relative: 1 %
Eosinophils Absolute: 0.2 K/uL (ref 0.0–0.5)
Eosinophils Relative: 3 %
HCT: 47 % — ABNORMAL HIGH (ref 36.0–46.0)
Hemoglobin: 14.3 g/dL (ref 12.0–15.0)
Immature Granulocytes: 1 %
Lymphocytes Relative: 33 %
Lymphs Abs: 2.5 K/uL (ref 0.7–4.0)
MCH: 27.7 pg (ref 26.0–34.0)
MCHC: 30.4 g/dL (ref 30.0–36.0)
MCV: 91.1 fL (ref 80.0–100.0)
Monocytes Absolute: 0.5 K/uL (ref 0.1–1.0)
Monocytes Relative: 6 %
Neutro Abs: 4.2 K/uL (ref 1.7–7.7)
Neutrophils Relative %: 56 %
Platelets: UNDETERMINED K/uL (ref 150–400)
RBC: 5.16 MIL/uL — ABNORMAL HIGH (ref 3.87–5.11)
RDW: 13.8 % (ref 11.5–15.5)
WBC: 8.2 K/uL (ref 4.0–10.5)
nRBC: 0 % (ref 0.0–0.2)

## 2024-01-31 LAB — BASIC METABOLIC PANEL WITH GFR
Anion gap: 13 (ref 5–15)
BUN: 13 mg/dL (ref 8–23)
CO2: 22 mmol/L (ref 22–32)
Calcium: 9.4 mg/dL (ref 8.9–10.3)
Chloride: 106 mmol/L (ref 98–111)
Creatinine, Ser: 0.75 mg/dL (ref 0.44–1.00)
GFR, Estimated: >60 mL/min (ref >60)
Glucose, Bld: 231 mg/dL — ABNORMAL HIGH (ref 70–99)
Potassium: 4.1 mmol/L (ref 3.5–5.1)
Sodium: 141 mmol/L (ref 135–145)

## 2024-01-31 LAB — MAGNESIUM: Magnesium: 2 mg/dL (ref 1.7–2.4)

## 2024-01-31 MED ORDER — KETOROLAC TROMETHAMINE 15 MG/ML IJ SOLN
15.0000 mg | Freq: Once | INTRAMUSCULAR | Status: AC
  Administered 2024-01-31: 15 mg via INTRAVENOUS
  Filled 2024-01-31: qty 1

## 2024-01-31 MED ORDER — METOCLOPRAMIDE HCL 5 MG/ML IJ SOLN
10.0000 mg | Freq: Once | INTRAMUSCULAR | Status: AC
  Administered 2024-01-31: 10 mg via INTRAVENOUS
  Filled 2024-01-31: qty 2

## 2024-01-31 MED ORDER — ONDANSETRON 4 MG PO TBDP: 4.0000 mg | ORAL_TABLET | Freq: Three times a day (TID) | ORAL | 0 refills | Status: AC | PRN

## 2024-01-31 MED ORDER — DIPHENHYDRAMINE HCL 50 MG/ML IJ SOLN
12.5000 mg | Freq: Once | INTRAMUSCULAR | Status: AC
  Administered 2024-01-31: 12.5 mg via INTRAVENOUS
  Filled 2024-01-31: qty 1

## 2024-01-31 MED ORDER — LACTATED RINGERS IV BOLUS
1000.0000 mL | Freq: Once | INTRAVENOUS | Status: AC
  Administered 2024-01-31: 1000 mL via INTRAVENOUS

## 2024-01-31 NOTE — ED Notes (Signed)
 NT called CCMD to monitor Pt.

## 2024-01-31 NOTE — ED Notes (Signed)
 Patient transported to MRI

## 2024-01-31 NOTE — ED Notes (Signed)
 Patient brought back to triage to talk to an RN. Patient became loud and yelled at staff demanding to be taken to the back to be seen.

## 2024-01-31 NOTE — Discharge Instructions (Addendum)
 Your test results today were reassuring.  The irregularity in your heart rate is due to premature atrial complexes.  Imaging of your brain today did not show any new findings.  I suspect that your symptoms are due to a concussion from the recent head injury.  You may have ongoing symptoms for the next days to weeks.  Take ibuprofen and/or Tylenol  as needed for headaches.  A prescription for a medication called ondansetron  was sent to your pharmacy.  Take this as needed for nausea.  Avoid activities that worsen your symptoms.  Follow-up with your primary care doctor.  Return to the emergency department for any new or worsening symptoms of concern.

## 2024-01-31 NOTE — ED Provider Notes (Signed)
 [place] EMERGENCY DEPARTMENT AT [facility] Provider Note   CSN: [id number] Arrival date & time: 01/30/24  1727     Patient presents with: Judith Chavez   Sharene Krikorian is a 78 y.o. female.    Fall Associated symptoms include headaches.  Presents after fall.  Medical history includes arthritis, remote breast cancer, depression, anxiety, DM.  2 nights ago, patient had a mechanical fall.  At the time, she was standing on the first rung of a stepladder.  She lost her balance and fell backwards.  She struck the back of her head on the linoleum floor.  She did not lose consciousness.  She went to bed and slept longer than usual the next day.  When she woke up, she had headache, swelling to the back of her head, she felt off balance with walking, she had some slurred speech.  Symptoms have persisted throughout the day.  She called her PCPs office and was sent to urgent care.  At urgent care, they were concerned of an irregular heart rate.  She was sent to the ED for further evaluation.  She is not on a blood thinner.     Prior to Admission medications   Medication Sig Start Date End Date Taking? Authorizing Provider  ondansetron  (ZOFRAN -ODT) 4 MG disintegrating tablet Take 1 tablet (4 mg total) by mouth every 8 (eight) hours as needed. 01/31/24  Yes Melvenia Motto, MD  acetaminophen  (TYLENOL  8 HOUR) 650 MG CR tablet Take 1 tablet (650 mg total) by mouth every 8 (eight) hours as needed for pain. 03/19/21   Magnant, Carlin CROME, PA-C  Cholecalciferol (DIALYVITE VITAMIN D 5000) 125 MCG (5000 UT) capsule Take 5,000 Units by mouth daily.    [provider]  HYDROcodone  bit-homatropine (HYCODAN) 5-1.5 MG/5ML syrup Take 5 mLs by mouth every 6 (six) hours as needed 1 hour prior to meals. 06/22/22     ibuprofen (ADVIL) 200 MG tablet Take 200 mg by mouth every 6 (six) hours as needed for headache or cramping.    [provider]  influenza vaccine adjuvanted (FLUAD) 0.5 ML injection Inject  into the muscle. 05/03/21   Luiz Channel, MD  NOVOFINE 32G X 6 MM MISC AS DIRECTED ONCE A DAY WITH TRESIBA [facility] 07/01/19   [provider]  [facility] DELICA LANCETS 33G MISC 2 (two) times daily. for testing 12/14/15   [provider]  [facility] VERIO test strip USE AS DIRECTED TO TEST BLOOD SUGAR TWICE A DAY 12/09/15   [provider]  predniSONE  (STERAPRED UNI-PAK 21 TAB) 10 MG (21) TBPK tablet Take as directed 04/25/23   Jerri Kay HERO, MD  Semaglutide , 2 MG/DOSE, (OZEMPIC , 2 MG/DOSE,) 8 MG/3ML SOPN Inject 2 m into the skin every 7 (seven) days. 04/13/23     Semaglutide , 2 MG/DOSE, (OZEMPIC , 2 MG/DOSE,) 8 MG/3ML SOPN Inject 2 mg into the skin once a week. 05/25/23     sitaGLIPtin (JANUVIA) 100 MG tablet Take 100 mg by mouth daily.    [provider]  TRESIBA FLEXTOUCH 100 UNIT/ML SOPN FlexTouch Pen Inject 30 Units into the skin daily. 07/01/19   [provider]    Allergies: Patient has no known allergies.    Review of Systems  Eyes:  Positive for visual disturbance.  Musculoskeletal:  Positive for gait problem.  Neurological:  Positive for dizziness and headaches.  All other systems reviewed and are negative.   Updated Vital Signs BP 120/81   Pulse 81   Temp 98.3 F (  36.8 C)   Resp (!) 23   Ht 5' 1 (1.549 m)   Wt 69.4 kg   SpO2 96%   BMI 28.91 kg/m   Physical Exam Vitals and nursing note reviewed.  Constitutional:      General: She is not in acute distress.    Appearance: Normal appearance. She is well-developed. She is not ill-appearing, toxic-appearing or diaphoretic.  HENT:     Head: Normocephalic.     Comments: Hematoma to posterior scalp.    Right Ear: External ear normal.     Left Ear: External ear normal.     Nose: Nose normal.  Eyes:     Extraocular Movements: Extraocular movements intact.     Conjunctiva/sclera: Conjunctivae normal.  Cardiovascular:     Rate and Rhythm: Normal rate and regular rhythm.   Pulmonary:     Effort: Pulmonary effort is normal. No respiratory distress.  Abdominal:     General: There is no distension.     Palpations: Abdomen is soft.     Tenderness: There is no abdominal tenderness.  Musculoskeletal:        General: No swelling. Normal range of motion.     Cervical back: Normal range of motion and neck supple.  Skin:    General: Skin is warm and dry.     Capillary Refill: Capillary refill takes less than 2 seconds.     Coloration: Skin is not jaundiced or pale.  Neurological:     General: No focal deficit present.     Mental Status: She is alert and oriented to person, place, and time.     Cranial Nerves: No cranial nerve deficit.     Sensory: No sensory deficit.     Motor: No weakness.     Coordination: Coordination normal.  Psychiatric:        Mood and Affect: Mood normal.        Behavior: Behavior normal.     (all labs ordered are listed, but only abnormal results are displayed) Labs Reviewed  CBC WITH DIFFERENTIAL/PLATELET - Abnormal; Notable for the following components:      Result Value   RBC 5.16 (*)    HCT 47.0 (*)    All other components within normal limits  BASIC METABOLIC PANEL WITH GFR - Abnormal; Notable for the following components:   Glucose, Bld 231 (*)    All other components within normal limits  MAGNESIUM    EKG: EKG Interpretation Date/Time:  Wednesday January 31 2024 02:02:39 EDT Ventricular Rate:  75 PR Interval:  132 QRS Duration:  77 QT Interval:  357 QTC Calculation: 399 R Axis:   26  Text Interpretation: Sinus rhythm Atrial premature complex Confirmed by Melvenia Motto [id number]) on 01/31/2024 2:03:52 AM  Radiology: MR BRAIN WO CONTRAST Result Date: 01/31/2024 EXAM: MRI BRAIN WITHOUT CONTRAST 01/31/2024 03:58:00 AM TECHNIQUE: Multiplanar multisequence MRI of the head/brain was performed without the administration of intravenous contrast. COMPARISON: MRI head 01/13/2017 CLINICAL HISTORY: Neuro deficit, acute, stroke  suspected. FINDINGS: BRAIN AND VENTRICLES: No acute infarct. No intracranial hemorrhage. No mass. No midline shift. No hydrocephalus. The sella is unremarkable. Normal flow voids. Progressive periventricular and subcortical white matter T2 hyperintensities are mildly advanced for age. ORBITS: No acute abnormality. SINUSES AND MASTOIDS: No acute abnormality. BONES AND SOFT TISSUES: Normal marrow signal. No acute soft tissue abnormality. IMPRESSION: 1. No acute intracranial abnormality. 2. Progressive periventricular and subcortical white matter T2 hyperintensities, mildly advanced for age. This most likely reflects the sequelae of  chronic microvascular ischemia. Electronically signed by: Lonni Necessary MD 01/31/2024 04:18 AM EDT RP Workstation: [id number]   CT Head Wo Contrast Result Date: 01/30/2024 EXAM: CT HEAD AND CERVICAL SPINE 01/30/2024 06:58:42 PM TECHNIQUE: CT of the head and cervical spine was performed without the administration of intravenous contrast. Multiplanar reformatted images are provided for review. Automated exposure control, iterative reconstruction, and/or weight based adjustment of the mA/kV was utilized to reduce the radiation dose to as low as reasonably achievable. COMPARISON: None available. CLINICAL HISTORY: Ataxia, head trauma. FINDINGS: CT HEAD BRAIN AND VENTRICLES: No acute intracranial hemorrhage. No mass effect or midline shift. No abnormal extra-axial fluid collection. Gray-white differentiation is maintained. No hydrocephalus. Mild subcortical and periventricular small vessel ischemic changes. ORBITS: No acute abnormality. SINUSES AND MASTOIDS: No acute abnormality. SOFT TISSUES AND SKULL: No acute skull fracture. No acute soft tissue abnormality. CT CERVICAL SPINE BONES AND ALIGNMENT: No acute fracture or traumatic malalignment. DEGENERATIVE CHANGES: Mild degenerative changes in the mid / lower cervical spine. SOFT TISSUES: No prevertebral soft tissue swelling. IMPRESSION:  1. No acute intracranial abnormality. Mild small vessel ischemic changes. 2. No traumatic injury to the cervical spine. Electronically signed by: Pinkie Pebbles MD 01/30/2024 07:12 PM EDT RP Workstation: [id number]   CT Cervical Spine Wo Contrast Result Date: 01/30/2024 EXAM: CT HEAD AND CERVICAL SPINE 01/30/2024 06:58:42 PM TECHNIQUE: CT of the head and cervical spine was performed without the administration of intravenous contrast. Multiplanar reformatted images are provided for review. Automated exposure control, iterative reconstruction, and/or weight based adjustment of the mA/kV was utilized to reduce the radiation dose to as low as reasonably achievable. COMPARISON: None available. CLINICAL HISTORY: Ataxia, head trauma. FINDINGS: CT HEAD BRAIN AND VENTRICLES: No acute intracranial hemorrhage. No mass effect or midline shift. No abnormal extra-axial fluid collection. Gray-white differentiation is maintained. No hydrocephalus. Mild subcortical and periventricular small vessel ischemic changes. ORBITS: No acute abnormality. SINUSES AND MASTOIDS: No acute abnormality. SOFT TISSUES AND SKULL: No acute skull fracture. No acute soft tissue abnormality. CT CERVICAL SPINE BONES AND ALIGNMENT: No acute fracture or traumatic malalignment. DEGENERATIVE CHANGES: Mild degenerative changes in the mid / lower cervical spine. SOFT TISSUES: No prevertebral soft tissue swelling. IMPRESSION: 1. No acute intracranial abnormality. Mild small vessel ischemic changes. 2. No traumatic injury to the cervical spine. Electronically signed by: Pinkie Pebbles MD 01/30/2024 07:12 PM EDT RP Workstation: [id number]     Procedures   Medications Ordered in the ED  lactated ringers  bolus 1,000 mL (0 mLs Intravenous Stopped 01/31/24 0312)  ketorolac  (TORADOL ) 15 MG/ML injection 15 mg (15 mg Intravenous Given 01/31/24 0206)  metoCLOPramide  (REGLAN ) injection 10 mg (10 mg Intravenous Given 01/31/24 0201)  diphenhydrAMINE  (BENADRYL )  injection 12.5 mg (12.5 mg Intravenous Given 01/31/24 0158)                                    Medical Decision Making Amount and/or Complexity of Data Reviewed Labs: ordered. Radiology: ordered.  Risk Prescription drug management.   This patient presents to the ED for concern of headache and dizziness, this involves an extensive number of treatment options, and is a complaint that carries with it a high risk of complications and morbidity.  The differential diagnosis includes postconcussive syndrome, CVA, anemia, metabolic derangements, vestibular neuritis, migraine   Co morbidities / Chronic conditions that complicate the patient evaluation  arthritis, remote breast cancer, depression, anxiety, DM   Additional history obtained:  Additional history obtained from EMR External records from outside source obtained and reviewed including patient's husband   Lab Tests:  I Ordered, and personally interpreted labs.  The pertinent results include: Normal hemoglobin, no leukocytosis, normal kidney function, normal electrolytes.  Mild hyperglycemia without evidence of DKA.   Imaging Studies ordered:  I ordered imaging studies including CT of head and cervical spine, MRI brain I independently visualized and interpreted imaging which showed no acute findings I agree with the radiologist interpretation   Cardiac Monitoring: / EKG:  The patient was maintained on a cardiac monitor.  I personally viewed and interpreted the cardiac monitored which showed an underlying rhythm of: Sinus rhythm   Problem List / ED Course / Critical interventions / Medication management  Patient presents for symptoms of headache, blurred vision, imbalance with walking.  This does follow with sounds like a significant head injury that occurred 2 nights ago.  Patient states that she fell backwards off of the first rung of a stepladder and struck the back of her head.  She does have a hematoma.  She states  that the swelling has been improving.  She underwent CT imaging of head and cervical spine which did not show any acute findings.  Given her age, will obtain MRI to rule out stroke.  Headache cocktail was ordered in the ED.  Additionally, she states that she was seen urgent care earlier today and they were concerned of an irregular heart rate.  On ED cardiac monitor, patient has a normal sinus rhythm with PACs.  Patient's MRI was negative for acute findings.  On reassessment, headache has resolved.  I suspect that patient has postconcussive symptoms given her head injury 2 days ago.  She is stable for discharge. I ordered medication including IV fluids, Toradol , Reglan , Benadryl  for headache Reevaluation of the patient after these medicines showed that the patient improved I have reviewed the patients home medicines and have made adjustments as needed   Social Determinants of Health:  Lives at home with husband     Final diagnoses:  Fall, initial encounter  Postconcussive syndrome    ED Discharge Orders          Ordered    ondansetron  (ZOFRAN -ODT) 4 MG disintegrating tablet  Every 8 hours PRN        01/31/24 0508               Melvenia Motto, MD 01/31/24 [phone]

## 2024-02-08 DIAGNOSIS — M810 Age-related osteoporosis without current pathological fracture: Secondary | ICD-10-CM | POA: Diagnosis not present

## 2024-02-14 DIAGNOSIS — R54 Age-related physical debility: Secondary | ICD-10-CM | POA: Diagnosis not present

## 2024-02-14 DIAGNOSIS — E1169 Type 2 diabetes mellitus with other specified complication: Secondary | ICD-10-CM | POA: Diagnosis not present

## 2024-02-14 DIAGNOSIS — R000 Tachycardia, unspecified: Secondary | ICD-10-CM | POA: Diagnosis not present

## 2024-02-14 DIAGNOSIS — S0990XA Unspecified injury of head, initial encounter: Secondary | ICD-10-CM | POA: Diagnosis not present

## 2024-02-14 DIAGNOSIS — T148XXA Other injury of unspecified body region, initial encounter: Secondary | ICD-10-CM | POA: Diagnosis not present

## 2024-02-14 DIAGNOSIS — R519 Headache, unspecified: Secondary | ICD-10-CM | POA: Diagnosis not present

## 2024-02-14 DIAGNOSIS — W19XXXA Unspecified fall, initial encounter: Secondary | ICD-10-CM | POA: Diagnosis not present

## 2024-02-14 DIAGNOSIS — S060X9A Concussion with loss of consciousness of unspecified duration, initial encounter: Secondary | ICD-10-CM | POA: Diagnosis not present

## 2024-02-14 DIAGNOSIS — F3342 Major depressive disorder, recurrent, in full remission: Secondary | ICD-10-CM | POA: Diagnosis not present

## 2024-02-21 ENCOUNTER — Ambulatory Visit
Admission: RE | Admit: 2024-02-21 | Discharge: 2024-02-21 | Disposition: A | Discharge status: Home / Self Care | Source: Ambulatory Visit | Attending: Hematology | Admitting: Hematology

## 2024-02-21 DIAGNOSIS — Z1231 Encounter for screening mammogram for malignant neoplasm of breast: Secondary | ICD-10-CM

## 2024-02-26 ENCOUNTER — Ambulatory Visit: Attending: Physician Assistant

## 2024-02-26 VITALS — BP 140/94 | HR 72

## 2024-02-26 DIAGNOSIS — M6281 Muscle weakness (generalized): Secondary | ICD-10-CM | POA: Diagnosis not present

## 2024-02-26 DIAGNOSIS — R2681 Unsteadiness on feet: Secondary | ICD-10-CM | POA: Insufficient documentation

## 2024-02-26 DIAGNOSIS — R42 Dizziness and giddiness: Secondary | ICD-10-CM | POA: Diagnosis not present

## 2024-02-26 DIAGNOSIS — R262 Difficulty in walking, not elsewhere classified: Secondary | ICD-10-CM | POA: Insufficient documentation

## 2024-02-26 NOTE — Therapy (Signed)
 OUTPATIENT PHYSICAL THERAPY VESTIBULAR EVALUATION     Patient Name: Judith Chavez MRN: [id number] DOB:1946-07-04, 78 y.o., female Today's Date: 02/26/2024  END OF SESSION:  PT End of Session - 02/26/24 1122     Visit Number 1    Number of Visits 17    Date for PT Re-Evaluation 04/26/24    Authorization Type Medicare/ [place]    PT Start Time 1128    PT Stop Time 1206    PT Time Calculation (min) 38 min    Activity Tolerance Treatment limited secondary to medical complications (Comment)   nausea   Behavior During Therapy Flat affect;WFL for tasks assessed/performed          Past Medical History:  Diagnosis Date   Anemia    as a teenager   Anxiety    Arthritis    knees   Cancer (HCC) 03/2020   right breast DCIS, LCIS   Depression    Diabetes mellitus without complication (HCC)    Type 2   Family history of breast cancer 02/28/2020   Headache    migraines   History of pneumonia    Numbness    left foot    Pneumonia    Poor circulation    Past Surgical History:  Procedure Laterality Date   ABCESS DRAINAGE     after cesarean- in hospital for 3 months   ABDOMINAL HYSTERECTOMY     BACK SURGERY     CESAREAN SECTION  07/11/1968   CHOLECYSTECTOMY     COLONOSCOPY     DILATION AND CURETTAGE OF UTERUS     EYE SURGERY Right    MASTECTOMY     MENISCUS REPAIR Bilateral    SIMPLE MASTECTOMY WITH AXILLARY SENTINEL NODE BIOPSY Right 04/13/2020   Procedure: RIGHT MASTECTOMY WITH RIGHT AXILLARY SENTINEL NODE BIOPSY;  Surgeon: Vernetta Berg, MD;  Location: [facility];  Service: General;  Laterality: Right;   SPINE SURGERY     TOTAL KNEE ARTHROPLASTY Right 02/22/2021   Procedure: RIGHT TOTAL KNEE ARTHROPLASTY;  Surgeon: Jerri Kay HERO, MD;  Location: [facility];  Service: Orthopedics;  Laterality: Right;   Patient Active Problem List   Diagnosis Date Noted   Pain in left shoulder 06/29/2023   Status post total right knee replacement 02/22/2021   History  of right breast cancer 04/13/2020   Genetic testing 03/10/2020   Family history of breast cancer 02/28/2020   Ductal carcinoma in situ (DCIS) of right breast 02/26/2020   Primary osteoarthritis of right knee 03/20/2019   Primary osteoarthritis of left knee 03/20/2019   Primary osteoarthritis of both knees 04/24/2017   Spondylolisthesis at L3-L4 level 06/01/2015    PCP: Joen Gentry, MD REFERRING PROVIDER: Sydelle Close, PA  REFERRING DIAG: S06.0X9A (ICD-10-CM) - Concussion with loss of consciousness of unspecified duration, initial encounter   THERAPY DIAG:  Dizziness and giddiness  Unsteadiness on feet  ONSET DATE: 02/15/24 referral   Rationale for Evaluation and Treatment: Rehabilitation  SUBJECTIVE:   SUBJECTIVE STATEMENT: Patient arrives to clinic with husband. Reports that she was up all night last night vomiting. Does not feel as though it's directly related to her fall. Has been having significant HA, nausea, R sided face pain, sometimes so sharp that she feels as though she has to hold her R eyelid up. She was standing on a ladder and was stepping over to a chair, lost her balance and fell on the back of her head.  Pt accompanied by: significant other  PERTINENT  HISTORY: anxiety, Breast CA, depression, DMII, migraines  PAIN:  Are you having pain? Yes: NPRS scale: 4-5/10 Pain location: B posterior head Pain description: piercing   PRECAUTIONS: Fall   WEIGHT BEARING RESTRICTIONS: No  FALLS: Has patient fallen in last 6 months? No  LIVING ENVIRONMENT: Lives with: lives with their spouse Lives in: House/apartment Stairs: Yes: Internal: 1 steps; none and External: flight steps; can reach both Has following equipment at home: Single point cane, Walker - 2 wheeled, Crutches, and shower chair  PLOF: Independent  PATIENT GOALS: to get rid of headache and some of the pain  OBJECTIVE:  Note: Objective measures were completed at Evaluation unless otherwise  noted.  DIAGNOSTIC FINDINGS: 01/30/24 CT head  IMPRESSION: 1. No acute intracranial abnormality. Mild small vessel ischemic changes. 2. No traumatic injury to the cervical spine.  COGNITION: Overall cognitive status: Within functional limits for tasks assessed   SENSATION: Subjectively n/t in B hands and toes, slightly increased since fall   POSTURE:  No Significant postural limitations  Cervical ROM:   WFL, pain free  STRENGTH: WFL  BED MOBILITY:  Independent, intermittent dizziness   PATIENT SURVEYS:  [place] Post Concussion Symptoms Questionnaire:  Compared to before the accident, do you now (last 24 hours) suffer from:  Headaches 4 = severe problem  Feeling of Dizziness 4 = severe problem  Nausea and/or vomiting 4 = severe problem  RPQ-3 (total of first 3 items) 12     Noise sensitivity (easily upset by loud noises) 4 = severe problem  Sleep disturbances 4 = severe problem  Fatigue, tiring more easily 4 = severe problem  Being irritable, easily angered: 4 = severe problem  Feeling depressed or tearful 3 = moderate problem  Feeling frustrated or impatient 3 = moderate problem  Forgetfulness, poor memory 3 = moderate problem  Poor concentration 3 = moderate problem  Taking longer to think 3 = moderate problem  Blurred vision 3 = moderate problem  Light sensitivity (easily upset by bright light) 4 = severe problem  Double vision 2 = mild problem  Restlessness 3 = moderate problem  RPQ-13 (total for next 13 items) 43/52     VESTIBULAR ASSESSMENT:  GENERAL OBSERVATION: NAD, no AD   SYMPTOM BEHAVIOR:  Subjective history: see above  Non-Vestibular symptoms: headaches and nausea/vomiting  Type of dizziness: Imbalance (Disequilibrium) more toward the R  Frequency: all day   Duration: all day  Aggravating factors: Induced by position change: lying supine, Induced by motion: bending down to the ground, Worse outside or in busy environment, and Moving  eyes  Relieving factors: dark room, rest, and slow movements  Progression of symptoms: unchanged  OCULOMOTOR EXAM:  Ocular Alignment: normal  Ocular ROM: No Limitations  Spontaneous Nystagmus: absent  Gaze-Induced Nystagmus: absent  Smooth Pursuits: intact  Saccades: intact  Convergence/Divergence: ~10 cm alternating eye suppression   VESTIBULAR - OCULAR REFLEX:   Slow VOR: Positive Right  VOR Cancellation: Normal  Head-Impulse Test: HIT Right: positive HIT Left: negative    POSITIONAL TESTING: TBA PRN  MOTION SENSITIVITY:  Motion Sensitivity Quotient Intensity: 0 = none, 1 = Lightheaded, 2 = Mild, 3 = Moderate, 4 = Severe, 5 = Vomiting  Intensity  1. Sitting to supine   2. Supine to L side   3. Supine to R side   4. Supine to sitting   5. L Hallpike-Dix   6. Up from L    7. R Hallpike-Dix   8. Up from R  9. Sitting, head tipped to L knee   10. Head up from L knee   11. Sitting, head tipped to R knee   12. Head up from R knee   13. Sitting head turns x5   14.Sitting head nods x5   15. In stance, 180 turn to L    16. In stance, 180 turn to R                                                                                                                                TREATMENT  Self care/home management: -pathophys of concussion -brain breaks, symptom- guided behavior  -brock string   PATIENT EDUCATION: Education details: PT POC, exam findings, see above Person educated: Patient and Spouse Education method: Explanation, Demonstration, and Handouts Education comprehension: verbalized understanding and needs further education  HOME EXERCISE PROGRAM:  GOALS: Goals reviewed with patient? Yes  SHORT TERM GOALS: Target date: 03/29/24  Pt will be independent with initial HEP for improved symptom report   Baseline: to be updated Goal status: INITIAL  2.  Patient will demonstrate a convergence of </=8cm to demonstrate improved binocular  functioning Baseline: 10cm Goal status: INITIAL  3.  Patient will score </=9 on the RPQ-3 to demonstrate improved symptom report related to her concussion Baseline: 12/12 Goal status: INITIAL  4.  Patient will score </=34 on the RPQ-13 to demonstrate improvement in symptoms Baseline: 43/52  Goal status: INITIAL  5.  MSQ goal  Baseline: to be assessed  Goal status: INITIAL   LONG TERM GOALS: Target date: 04/26/24  Pt will be independent with final HEP for improved symptom report   Baseline: to be updated Goal status: INITIAL  Patient will demonstrate a convergence of </=6cm to demonstrate improved binocular functioning Baseline: 10cm Goal status: INITIAL   Patient will score </=6 on the RPQ-3 to demonstrate improved symptom report related to her concussion Baseline: 12/12 Goal status: INITIAL  Patient will score </=25 on the RPQ-13 to demonstrate improvement in symptoms Baseline: 43/52  Goal status: INITIAL  MSQ goal  Baseline: to be assessed  Goal status: INITIAL  ASSESSMENT:  CLINICAL IMPRESSION: Patient is an 78 y.o. female who was seen today for physical therapy evaluation and treatment for post-concussion symptoms.  Roughly 1 month ago, she fell and sustained a concussion, landing on the posterior aspect of her head. Since then, she has experienced daily HA, photo/phonophobia, dizziness/imbalance and brain fog. She scored a 12/12 on the RPQ-3 and a 43/52 on the RPQ-13, both reflective of significant disability and impact from her post-concussion symptoms. Her vestibular exam is reflective of a R vestibular hypofunction, also contributing to her symptomology. She would benefit from skilled PT services to address the above mentioned deficits.   OBJECTIVE IMPAIRMENTS: decreased balance, decreased knowledge of condition, dizziness, impaired vision/preception, and pain.   ACTIVITY LIMITATIONS: bending, standing, stairs, bed mobility, locomotion level, and caring for  others  PARTICIPATION LIMITATIONS: interpersonal  relationship, driving, shopping, community activity, yard work, and church  PERSONAL FACTORS: Age, Fitness, Past/current experiences, Sex, Time since onset of injury/illness/exacerbation, and 3+ comorbidities: see above are also affecting patient's functional outcome.   REHAB POTENTIAL: Good  CLINICAL DECISION MAKING: Stable/uncomplicated  EVALUATION COMPLEXITY: Low   PLAN:  PT FREQUENCY: 2x/week  PT DURATION: 8 weeks  PLANNED INTERVENTIONS: 97164- PT Re-evaluation, 97750- Physical Performance Testing, 97110-Therapeutic exercises, 97530- Therapeutic activity, [id number]- Neuromuscular re-education, 97535- Self Care, [id number]- Manual therapy, [phone]- Gait training, [phone]- Orthotic Initial, [phone]- Orthotic/Prosthetic subsequent, [phone]- Canalith repositioning, [phone]- Aquatic Therapy, Patient/Family education, Balance training, Stair training, Joint mobilization, Spinal mobilization, Vestibular training, Visual/preceptual remediation/compensation, Cognitive remediation, DME instructions, Cryotherapy, and Moist heat  PLAN FOR NEXT SESSION: MSQ + goal, address convergence, slow VOR, slow VOR cancellation, may need to be in dimmed tx room to tolerate exercises, review Lucious string, may need to address cervical impairments/pain at some point   Delon DELENA Pop, PT Delon DELENA Pop, PT, DPT, CBIS  02/26/2024, 12:34 PM

## 2024-03-05 ENCOUNTER — Ambulatory Visit: Admitting: Physical Therapy

## 2024-03-05 ENCOUNTER — Encounter: Payer: Self-pay | Admitting: Physical Therapy

## 2024-03-05 DIAGNOSIS — R2681 Unsteadiness on feet: Secondary | ICD-10-CM | POA: Diagnosis not present

## 2024-03-05 DIAGNOSIS — M6281 Muscle weakness (generalized): Secondary | ICD-10-CM | POA: Diagnosis not present

## 2024-03-05 DIAGNOSIS — R42 Dizziness and giddiness: Secondary | ICD-10-CM | POA: Diagnosis not present

## 2024-03-05 DIAGNOSIS — R262 Difficulty in walking, not elsewhere classified: Secondary | ICD-10-CM | POA: Diagnosis not present

## 2024-03-05 NOTE — Therapy (Signed)
 OUTPATIENT PHYSICAL THERAPY VESTIBULAR TREATMENT NOTE     Patient Name: Judith Chavez MRN: [id number] DOB:01/12/1946, 78 y.o., female Today's Date: 03/05/2024  END OF SESSION:  PT End of Session - 03/05/24 1135     Visit Number 2    Number of Visits 17    Date for PT Re-Evaluation 04/26/24    Authorization Type Medicare/ [place]    PT Start Time 0932    PT Stop Time 1015    PT Time Calculation (min) 43 min    Activity Tolerance Patient tolerated treatment well   nausea   Behavior During Therapy [facility] for tasks assessed/performed;Anxious           Past Medical History:  Diagnosis Date   Anemia    as a teenager   Anxiety    Arthritis    knees   Cancer (HCC) 03/2020   right breast DCIS, LCIS   Depression    Diabetes mellitus without complication (HCC)    Type 2   Family history of breast cancer 02/28/2020   Headache    migraines   History of pneumonia    Numbness    left foot    Pneumonia    Poor circulation    Past Surgical History:  Procedure Laterality Date   ABCESS DRAINAGE     after cesarean- in hospital for 3 months   ABDOMINAL HYSTERECTOMY     BACK SURGERY     CESAREAN SECTION  07/11/1968   CHOLECYSTECTOMY     COLONOSCOPY     DILATION AND CURETTAGE OF UTERUS     EYE SURGERY Right    MASTECTOMY     MENISCUS REPAIR Bilateral    SIMPLE MASTECTOMY WITH AXILLARY SENTINEL NODE BIOPSY Right 04/13/2020   Procedure: RIGHT MASTECTOMY WITH RIGHT AXILLARY SENTINEL NODE BIOPSY;  Surgeon: Vernetta Berg, MD;  Location: [facility];  Service: General;  Laterality: Right;   SPINE SURGERY     TOTAL KNEE ARTHROPLASTY Right 02/22/2021   Procedure: RIGHT TOTAL KNEE ARTHROPLASTY;  Surgeon: Jerri Kay HERO, MD;  Location: [facility];  Service: Orthopedics;  Laterality: Right;   Patient Active Problem List   Diagnosis Date Noted   Pain in left shoulder 06/29/2023   Status post total right knee replacement 02/22/2021   History of right breast cancer  04/13/2020   Genetic testing 03/10/2020   Family history of breast cancer 02/28/2020   Ductal carcinoma in situ (DCIS) of right breast 02/26/2020   Primary osteoarthritis of right knee 03/20/2019   Primary osteoarthritis of left knee 03/20/2019   Primary osteoarthritis of both knees 04/24/2017   Spondylolisthesis at L3-L4 level 06/01/2015    PCP: Joen Gentry, MD REFERRING PROVIDER: Sydelle Close, PA  REFERRING DIAG: S06.0X9A (ICD-10-CM) - Concussion with loss of consciousness of unspecified duration, initial encounter   THERAPY DIAG:  Dizziness and giddiness  ONSET DATE: 02/15/24 referral   Rationale for Evaluation and Treatment: Rehabilitation  SUBJECTIVE:   SUBJECTIVE STATEMENT: Patient reports she is feeling overwhelmed with everything, including so many scheduled appts (somewhat tearful) and wants to get away for a while.  Pt states she is trying to make arrangements to go and stay in [place]  for a couple of months to rest and focus on recovering - pt states I am tired; Pt drove herself to PT today - says it is the first time she has driven since her injury; says she got in the car and couldn't remember the address of the clinic - had to  go ask her husband.  Pt reports she is having problems with her memory, processing time and saying/writing the correct word with what she is thinking; informed pt that she may benefit from a ST referral to address these problems.   Pt says she tried the eye exercises Charmain string) but it made her dizzy so she didn't do them very much.  Reports she has been limiting screen time; has been avoiding reading; encouraged pt to keep trying and do them as tolerated   Pt accompanied by: Self   PERTINENT HISTORY: anxiety, Breast CA, depression, DMII, migraines  PAIN:  Are you having pain? Yes: NPRS scale: 4/10 Pain location: B posterior head Pain description: piercing  Pt reports she feels as if she has a sinus infection - has pressure behind  eyes  PRECAUTIONS: Fall   WEIGHT BEARING RESTRICTIONS: No  FALLS: Has patient fallen in last 6 months? No  LIVING ENVIRONMENT: Lives with: lives with their spouse Lives in: House/apartment Stairs: Yes: Internal: 1 steps; none and External: flight steps; can reach both Has following equipment at home: Single point cane, Walker - 2 wheeled, Crutches, and shower chair  PLOF: Independent  PATIENT GOALS: to get rid of headache and some of the pain  OBJECTIVE:  Note: Objective measures were completed at Evaluation unless otherwise noted.  DIAGNOSTIC FINDINGS: 01/30/24 CT head  IMPRESSION: 1. No acute intracranial abnormality. Mild small vessel ischemic changes. 2. No traumatic injury to the cervical spine.  COGNITION: Overall cognitive status: Within functional limits for tasks assessed   SENSATION: Subjectively n/t in B hands and toes, slightly increased since fall   POSTURE:  No Significant postural limitations  Cervical ROM:   WFL, pain free  STRENGTH: WFL  BED MOBILITY:  Independent, intermittent dizziness   PATIENT SURVEYS:  [place] Post Concussion Symptoms Questionnaire:  Compared to before the accident, do you now (last 24 hours) suffer from:  Headaches 4 = severe problem  Feeling of Dizziness 4 = severe problem  Nausea and/or vomiting 4 = severe problem  RPQ-3 (total of first 3 items) 12     Noise sensitivity (easily upset by loud noises) 4 = severe problem  Sleep disturbances 4 = severe problem  Fatigue, tiring more easily 4 = severe problem  Being irritable, easily angered: 4 = severe problem  Feeling depressed or tearful 3 = moderate problem  Feeling frustrated or impatient 3 = moderate problem  Forgetfulness, poor memory 3 = moderate problem  Poor concentration 3 = moderate problem  Taking longer to think 3 = moderate problem  Blurred vision 3 = moderate problem  Light sensitivity (easily upset by bright light) 4 = severe problem  Double  vision 2 = mild problem  Restlessness 3 = moderate problem  RPQ-13 (total for next 13 items) 43/52     VESTIBULAR ASSESSMENT:  GENERAL OBSERVATION: NAD, no AD   SYMPTOM BEHAVIOR:  Subjective history: see above  Non-Vestibular symptoms: headaches and nausea/vomiting  Type of dizziness: Imbalance (Disequilibrium) more toward the R  Frequency: all day   Duration: all day  Aggravating factors: Induced by position change: lying supine, Induced by motion: bending down to the ground, Worse outside or in busy environment, and Moving eyes  Relieving factors: dark room, rest, and slow movements  Progression of symptoms: unchanged  OCULOMOTOR EXAM:  Ocular Alignment: normal  Ocular ROM: No Limitations  Spontaneous Nystagmus: absent  Gaze-Induced Nystagmus: absent  Smooth Pursuits: intact  Saccades: intact  Convergence/Divergence: ~10 cm alternating eye  suppression   VESTIBULAR - OCULAR REFLEX:   Slow VOR: Positive Right  VOR Cancellation: Normal  Head-Impulse Test: HIT Right: positive HIT Left: negative    POSITIONAL TESTING: TBA PRN  MOTION SENSITIVITY:  Motion Sensitivity Quotient Intensity: 0 = none, 1 = Lightheaded, 2 = Mild, 3 = Moderate, 4 = Severe, 5 = Vomiting  Intensity  1. Sitting to supine 2  2. Supine to L side 2-3  3. Supine to R side 0  4. Supine to sitting 2-3 (feels like she is going over to Lt side)  5. L Hallpike-Dix 3  6. Up from L  3  7. R Hallpike-Dix 2  8. Up from R  3 - c/o pressure in head  9. Sitting, head tipped to L knee 2  10. Head up from L knee 2  11. Sitting, head tipped to R knee 3  12. Head up from R knee 3  13. Sitting head turns x5 3 (nauseous) EC  14.Sitting head nods x5 3  tinnitus provoked   15. In stance, 180 turn to L  3  16. In stance, 180 turn to R 2-3                                                                                                                               TREATMENT:  03-05-24  TherAct:   Performed MSQ  - see above  Habituation - pt performed sit to Rt sidelying 5 reps; c/o dizziness and pressure in head with return to upright;      Sit to Lt sidelying 3 reps only due to dizziness; pt unable to transfer Lt sidelying to sitting with EO but able to do so with EC - on all 3 reps  NeuroRe-ed: Reviewed convergence exercise as issued for HEP Pt performed x1 viewing in seated position - horizontal head turns 30 secs with min. C/o dizziness; vertical head turns 20 secs with pt reporting letter (A) becoming wide and somewhat blurry; pt performed 1 rep each direction  Performed VOR cancellation in seated position 10 reps Smooth pursuits seated position approx. 3 reps horizontally and vertically (for HEP instruction - 3 reps only due to time constraint) Saccades seated position - 3 reps horizontal and vertical directions (for HEP instruction - 3 reps only due to time constraint)  HEP:  Medbridge Access Code: [email or web address] URL: [email or web address] Date: 03/05/2024 Prepared by: Rock Kussmaul  Exercises - Seated Gaze Stabilization with Head Nod  - 2-3 x daily - 7 x weekly - 1 sets - 10 reps - 30 sec hold - Seated Gaze Stabilization with Head Rotation  - 2-3 x daily - 7 x weekly - 1 sets - 10 reps - 30 secs hold - Seated VOR Cancellation  - 2 x daily - 7 x weekly - 1 sets - 1 reps - Seated Horizontal Smooth Pursuit  - 2 x daily - 7 x weekly - 1 sets - 1 reps -  Seated Vertical Smooth Pursuit  - 2 x daily - 7 x weekly - 1 sets - 1 reps  PATIENT EDUCATION: Education details: Medbridge HEP - see above Person educated: Patient and Spouse Education method: Explanation, Demonstration, and Handouts Education comprehension: verbalized understanding and needs further education  HOME EXERCISE PROGRAM:  GOALS: Goals reviewed with patient? Yes  SHORT TERM GOALS: Target date: 03/29/24  Pt will be independent with initial HEP for improved symptom report   Baseline: to be updated Goal status:  INITIAL  2.  Patient will demonstrate a convergence of </=8cm to demonstrate improved binocular functioning Baseline: 10cm Goal status: INITIAL  3.  Patient will score </=9 on the RPQ-3 to demonstrate improved symptom report related to her concussion Baseline: 12/12 Goal status: INITIAL  4.  Patient will score </=34 on the RPQ-13 to demonstrate improvement in symptoms Baseline: 43/52  Goal status: INITIAL  5.  MSQ goal; perform sit to supine and horizontal and vertical head turns with c/o dizziness </= 2/5 Baseline: to be assessed  Goal status: INITIAL   LONG TERM GOALS: Target date: 04/26/24  Pt will be independent with final HEP for improved symptom report   Baseline: to be updated Goal status: INITIAL  Patient will demonstrate a convergence of </=6cm to demonstrate improved binocular functioning Baseline: 10cm Goal status: INITIAL   Patient will score </=6 on the RPQ-3 to demonstrate improved symptom report related to her concussion Baseline: 12/12 Goal status: INITIAL  Patient will score </=25 on the RPQ-13 to demonstrate improvement in symptoms Baseline: 43/52  Goal status: INITIAL  MSQ goal; perform all motions on MSQ with dizziness rating </= 1/5 Baseline: to be assessed  Goal status: INITIAL  ASSESSMENT:  CLINICAL IMPRESSION: PT session focused on further vestibular assessment with completion of MSQ with pt reporting dizziness and pressure in head with return to upright sitting from sidelying and Dix-Hallpike test positions. No nystagmus noted during any positional testing or movement.  Pt reported dizziness provoked with vertical head turns with gaze stabilization with change in acuity of target after 20 secs.  Pt reported smooth pursuit exercise made her feel more tired.  Pt appears to have some motion sensitivity as symptoms provoked with sit to/from sidelying; pt unable to transfer from Lt sidelying to sitting position with EO but was able to do so with EC.   Cont with POC.    OBJECTIVE IMPAIRMENTS: decreased balance, decreased knowledge of condition, dizziness, impaired vision/preception, and pain.   ACTIVITY LIMITATIONS: bending, standing, stairs, bed mobility, locomotion level, and caring for others  PARTICIPATION LIMITATIONS: interpersonal relationship, driving, shopping, community activity, yard work, and church  PERSONAL FACTORS: Age, Fitness, Past/current experiences, Sex, Time since onset of injury/illness/exacerbation, and 3+ comorbidities: see above are also affecting patient's functional outcome.   REHAB POTENTIAL: Good  CLINICAL DECISION MAKING: Stable/uncomplicated  EVALUATION COMPLEXITY: Low   PLAN:  PT FREQUENCY: 2x/week  PT DURATION: 8 weeks  PLANNED INTERVENTIONS: 97164- PT Re-evaluation, 97750- Physical Performance Testing, 97110-Therapeutic exercises, 97530- Therapeutic activity, 97112- Neuromuscular re-education, 97535- Self Care, 02859- Manual therapy, [phone]- Gait training, [phone]- Orthotic Initial, [phone]- Orthotic/Prosthetic subsequent, [phone]- Canalith repositioning, [phone]- Aquatic Therapy, Patient/Family education, Balance training, Stair training, Joint mobilization, Spinal mobilization, Vestibular training, Visual/preceptual remediation/compensation, Cognitive remediation, DME instructions, Cryotherapy, and Moist heat  PLAN FOR NEXT SESSION: Review convergence ex and other oculomotor exercises issued in today's session; add to HEP as indicated;  do mCTSIB   Bob Daversa, [place], PT  03/05/2024, 11:39 AM

## 2024-03-08 ENCOUNTER — Other Ambulatory Visit (HOSPITAL_COMMUNITY): Payer: Self-pay | Admitting: Family Medicine

## 2024-03-08 ENCOUNTER — Ambulatory Visit (HOSPITAL_BASED_OUTPATIENT_CLINIC_OR_DEPARTMENT_OTHER)
Admission: RE | Admit: 2024-03-08 | Discharge: 2024-03-08 | Disposition: A | Discharge status: Home / Self Care | Source: Ambulatory Visit | Attending: Family Medicine | Admitting: Family Medicine

## 2024-03-08 ENCOUNTER — Other Ambulatory Visit: Payer: Self-pay | Admitting: Family Medicine

## 2024-03-08 ENCOUNTER — Ambulatory Visit

## 2024-03-08 VITALS — BP 144/105 | HR 74

## 2024-03-08 DIAGNOSIS — G4700 Insomnia, unspecified: Secondary | ICD-10-CM | POA: Diagnosis not present

## 2024-03-08 DIAGNOSIS — M6281 Muscle weakness (generalized): Secondary | ICD-10-CM

## 2024-03-08 DIAGNOSIS — R2681 Unsteadiness on feet: Secondary | ICD-10-CM | POA: Diagnosis not present

## 2024-03-08 DIAGNOSIS — S0990XA Unspecified injury of head, initial encounter: Secondary | ICD-10-CM | POA: Diagnosis not present

## 2024-03-08 DIAGNOSIS — R519 Headache, unspecified: Secondary | ICD-10-CM

## 2024-03-08 DIAGNOSIS — R030 Elevated blood-pressure reading, without diagnosis of hypertension: Secondary | ICD-10-CM | POA: Diagnosis not present

## 2024-03-08 DIAGNOSIS — R42 Dizziness and giddiness: Secondary | ICD-10-CM | POA: Diagnosis not present

## 2024-03-08 DIAGNOSIS — R262 Difficulty in walking, not elsewhere classified: Secondary | ICD-10-CM

## 2024-03-08 DIAGNOSIS — S060X9A Concussion with loss of consciousness of unspecified duration, initial encounter: Secondary | ICD-10-CM | POA: Diagnosis not present

## 2024-03-08 NOTE — Therapy (Signed)
 OUTPATIENT PHYSICAL THERAPY VESTIBULAR TREATMENT NOTE     Patient Name: Judith Chavez MRN: [id number] DOB:1945-07-14, 78 y.o., female Today's Date: 03/08/2024  END OF SESSION:  PT End of Session - 03/08/24 1143     Visit Number 3    Number of Visits 17    Date for PT Re-Evaluation 04/26/24    Authorization Type Medicare/ [place]    Progress Note Due on Visit 10    PT Start Time 1145    PT Stop Time 1210   HTN   PT Time Calculation (min) 25 min    Activity Tolerance Treatment limited secondary to medical complications (Comment)   HTN   Behavior During Therapy Flat affect;Anxious           Past Medical History:  Diagnosis Date   Anemia    as a teenager   Anxiety    Arthritis    knees   Cancer (HCC) 03/2020   right breast DCIS, LCIS   Depression    Diabetes mellitus without complication (HCC)    Type 2   Family history of breast cancer 02/28/2020   Headache    migraines   History of pneumonia    Numbness    left foot    Pneumonia    Poor circulation    Past Surgical History:  Procedure Laterality Date   ABCESS DRAINAGE     after cesarean- in hospital for 3 months   ABDOMINAL HYSTERECTOMY     BACK SURGERY     CESAREAN SECTION  07/11/1968   CHOLECYSTECTOMY     COLONOSCOPY     DILATION AND CURETTAGE OF UTERUS     EYE SURGERY Right    MASTECTOMY     MENISCUS REPAIR Bilateral    SIMPLE MASTECTOMY WITH AXILLARY SENTINEL NODE BIOPSY Right 04/13/2020   Procedure: RIGHT MASTECTOMY WITH RIGHT AXILLARY SENTINEL NODE BIOPSY;  Surgeon: Vernetta Berg, MD;  Location: [facility];  Service: General;  Laterality: Right;   SPINE SURGERY     TOTAL KNEE ARTHROPLASTY Right 02/22/2021   Procedure: RIGHT TOTAL KNEE ARTHROPLASTY;  Surgeon: Jerri Kay HERO, MD;  Location: [facility] OR;  Service: Orthopedics;  Laterality: Right;   Patient Active Problem List   Diagnosis Date Noted   Pain in left shoulder 06/29/2023   Status post total right knee replacement  02/22/2021   History of right breast cancer 04/13/2020   Genetic testing 03/10/2020   Family history of breast cancer 02/28/2020   Ductal carcinoma in situ (DCIS) of right breast 02/26/2020   Primary osteoarthritis of right knee 03/20/2019   Primary osteoarthritis of left knee 03/20/2019   Primary osteoarthritis of both knees 04/24/2017   Spondylolisthesis at L3-L4 level 06/01/2015    PCP: Joen Gentry, MD REFERRING PROVIDER: Sydelle Close, PA  REFERRING DIAG: S06.0X9A (ICD-10-CM) - Concussion with loss of consciousness of unspecified duration, initial encounter   THERAPY DIAG:  Dizziness and giddiness  Unsteadiness on feet  Muscle weakness (generalized)  Difficulty in walking, not elsewhere classified  ONSET DATE: 02/15/24 referral   Rationale for Evaluation and Treatment: Rehabilitation  SUBJECTIVE:   SUBJECTIVE STATEMENT: Patient reports doing well. Arrives to clinic alone. Reports exercises are going fair. Denies falls. Patient denies overt dizziness, but endorses significant fatigue.   PERTINENT HISTORY: anxiety, Breast CA, depression, DMII, migraines  PAIN:  Are you having pain? No   PRECAUTIONS: Fall  PATIENT GOALS: to get rid of headache and some of the pain  OBJECTIVE:  Note: Objective measures  were completed at Evaluation unless otherwise noted.  DIAGNOSTIC FINDINGS: 01/30/24 CT head  IMPRESSION: 1. No acute intracranial abnormality. Mild small vessel ischemic changes. 2. No traumatic injury to the cervical spine.  PATIENT SURVEYS:  [place] Post Concussion Symptoms Questionnaire:  Compared to before the accident, do you now (last 24 hours) suffer from:  Headaches 4 = severe problem  Feeling of Dizziness 4 = severe problem  Nausea and/or vomiting 4 = severe problem  RPQ-3 (total of first 3 items) 12     Noise sensitivity (easily upset by loud noises) 4 = severe problem  Sleep disturbances 4 = severe problem  Fatigue, tiring more easily 4 =  severe problem  Being irritable, easily angered: 4 = severe problem  Feeling depressed or tearful 3 = moderate problem  Feeling frustrated or impatient 3 = moderate problem  Forgetfulness, poor memory 3 = moderate problem  Poor concentration 3 = moderate problem  Taking longer to think 3 = moderate problem  Blurred vision 3 = moderate problem  Light sensitivity (easily upset by bright light) 4 = severe problem  Double vision 2 = mild problem  Restlessness 3 = moderate problem  RPQ-13 (total for next 13 items) 43/52     VESTIBULAR ASSESSMENT:  GENERAL OBSERVATION: NAD, no AD   SYMPTOM BEHAVIOR:  Subjective history: see above  Non-Vestibular symptoms: headaches and nausea/vomiting  Type of dizziness: Imbalance (Disequilibrium) more toward the R  Frequency: all day   Duration: all day  Aggravating factors: Induced by position change: lying supine, Induced by motion: bending down to the ground, Worse outside or in busy environment, and Moving eyes  Relieving factors: dark room, rest, and slow movements  Progression of symptoms: unchanged  OCULOMOTOR EXAM:  Ocular Alignment: normal  Ocular ROM: No Limitations  Spontaneous Nystagmus: absent  Gaze-Induced Nystagmus: absent  Smooth Pursuits: intact  Saccades: intact  Convergence/Divergence: ~10 cm alternating eye suppression   VESTIBULAR - OCULAR REFLEX:   Slow VOR: Positive Right  VOR Cancellation: Normal  Head-Impulse Test: HIT Right: positive HIT Left: negative    POSITIONAL TESTING: TBA PRN  MOTION SENSITIVITY:  Motion Sensitivity Quotient Intensity: 0 = none, 1 = Lightheaded, 2 = Mild, 3 = Moderate, 4 = Severe, 5 = Vomiting  Intensity  1. Sitting to supine 2  2. Supine to L side 2-3  3. Supine to R side 0  4. Supine to sitting 2-3 (feels like she is going over to Lt side)  5. L Hallpike-Dix 3  6. Up from L  3  7. R Hallpike-Dix 2  8. Up from R  3 - c/o pressure in head  9. Sitting, head tipped to L knee 2  10.  Head up from L knee 2  11. Sitting, head tipped to R knee 3  12. Head up from R knee 3  13. Sitting head turns x5 3 (nauseous) EC  14.Sitting head nods x5 3  tinnitus provoked   15. In stance, 180 turn to L  3  16. In stance, 180 turn to R 2-3  TREATMENT:  Theract: -habituation R sidelying: onset of HA  L sidelying: no dizziness, but reports pressure   Vitals:   03/08/24 1154 03/08/24 1159  BP: (!) 137/105 (!) 144/105  Pulse: 87 74   -Review of safe BP parameters and s/s of CVA  PATIENT EDUCATION: Education details: see above Person educated: Patient and Spouse Education method: Explanation, Demonstration, and Handouts Education comprehension: verbalized understanding and needs further education  HOME EXERCISE PROGRAM: Medbridge Access Code: [email or web address] URL: [email or web address] Date: 03/05/2024 Prepared by: Rock Kussmaul  Exercises - Seated Gaze Stabilization with Head Nod  - 2-3 x daily - 7 x weekly - 1 sets - 10 reps - 30 sec hold - Seated Gaze Stabilization with Head Rotation  - 2-3 x daily - 7 x weekly - 1 sets - 10 reps - 30 secs hold - Seated VOR Cancellation  - 2 x daily - 7 x weekly - 1 sets - 1 reps - Seated Horizontal Smooth Pursuit  - 2 x daily - 7 x weekly - 1 sets - 1 reps - Seated Vertical Smooth Pursuit  - 2 x daily - 7 x weekly - 1 sets - 1 reps GOALS: Goals reviewed with patient? Yes  SHORT TERM GOALS: Target date: 03/29/24  Pt will be independent with initial HEP for improved symptom report   Baseline: to be updated Goal status: INITIAL  2.  Patient will demonstrate a convergence of </=8cm to demonstrate improved binocular functioning Baseline: 10cm Goal status: INITIAL  3.  Patient will score </=9 on the RPQ-3 to demonstrate improved symptom report related to her concussion Baseline: 12/12 Goal status:  INITIAL  4.  Patient will score </=34 on the RPQ-13 to demonstrate improvement in symptoms Baseline: 43/52  Goal status: INITIAL  5.  MSQ goal; perform sit to supine and horizontal and vertical head turns with c/o dizziness </= 2/5 Baseline: to be assessed  Goal status: INITIAL   LONG TERM GOALS: Target date: 04/26/24  Pt will be independent with final HEP for improved symptom report   Baseline: to be updated Goal status: INITIAL  Patient will demonstrate a convergence of </=6cm to demonstrate improved binocular functioning Baseline: 10cm Goal status: INITIAL   Patient will score </=6 on the RPQ-3 to demonstrate improved symptom report related to her concussion Baseline: 12/12 Goal status: INITIAL  Patient will score </=25 on the RPQ-13 to demonstrate improvement in symptoms Baseline: 43/52  Goal status: INITIAL  MSQ goal; perform all motions on MSQ with dizziness rating </= 1/5 Baseline: to be assessed  Goal status: INITIAL  ASSESSMENT:  CLINICAL IMPRESSION: Patient seen for skilled PT session with emphasis on patient education. She reports slight improvements in her dizziness, but does still have pressure in her head and states she's tired and more irritable. Her BP was elevated today, well beyond typical (128/84 per last PCP note). She reports R eye blurriness that has progressed since last night, but no other overt s/s of CVA. PT provided patient with information for s/s of CVA. PT encouraging patient to reach out to PCP. Patient verbalized understanding. Continue POC as able.   OBJECTIVE IMPAIRMENTS: decreased balance, decreased knowledge of condition, dizziness, impaired vision/preception, and pain.   ACTIVITY LIMITATIONS: bending, standing, stairs, bed mobility, locomotion level, and caring for others  PARTICIPATION LIMITATIONS: interpersonal relationship, driving, shopping, community activity, yard work, and church  PERSONAL FACTORS: Age, Fitness, Past/current  experiences, Sex, Time since onset of injury/illness/exacerbation, and 3+ comorbidities: see above are also affecting patient's functional  outcome.   REHAB POTENTIAL: Good  CLINICAL DECISION MAKING: Stable/uncomplicated  EVALUATION COMPLEXITY: Low   PLAN:  PT FREQUENCY: 2x/week  PT DURATION: 8 weeks  PLANNED INTERVENTIONS: [id number]- PT Re-evaluation, 97750- Physical Performance Testing, 97110-Therapeutic exercises, 97530- Therapeutic activity, [id number]- Neuromuscular re-education, 97535- Self Care, [id number]- Manual therapy, [phone]- Gait training, [phone]- Orthotic Initial, [phone]- Orthotic/Prosthetic subsequent, [phone]- Canalith repositioning, [phone]- Aquatic Therapy, Patient/Family education, Balance training, Stair training, Joint mobilization, Spinal mobilization, Vestibular training, Visual/preceptual remediation/compensation, Cognitive remediation, DME instructions, Cryotherapy, and Moist heat  PLAN FOR NEXT SESSION:  CHECK BP! Review convergence ex and other oculomotor exercises issued in today's session; add to HEP as indicated;  do mCTSIB   Delon DELENA Pop, PT Delon DELENA Pop, PT, DPT, CBIS  03/08/2024, 12:28 PM

## 2024-03-12 ENCOUNTER — Ambulatory Visit: Admitting: Physical Therapy

## 2024-03-12 DIAGNOSIS — M810 Age-related osteoporosis without current pathological fracture: Secondary | ICD-10-CM | POA: Diagnosis not present

## 2024-03-12 DIAGNOSIS — I10 Essential (primary) hypertension: Secondary | ICD-10-CM | POA: Diagnosis not present

## 2024-03-14 ENCOUNTER — Ambulatory Visit: Admitting: Physical Therapy

## 2024-03-18 ENCOUNTER — Ambulatory Visit

## 2024-03-19 ENCOUNTER — Other Ambulatory Visit (HOSPITAL_BASED_OUTPATIENT_CLINIC_OR_DEPARTMENT_OTHER): Payer: Self-pay

## 2024-03-22 ENCOUNTER — Ambulatory Visit

## 2024-03-25 ENCOUNTER — Ambulatory Visit: Admitting: Physical Therapy

## 2024-03-29 ENCOUNTER — Ambulatory Visit

## 2024-04-01 ENCOUNTER — Ambulatory Visit

## 2024-04-05 ENCOUNTER — Ambulatory Visit

## 2024-04-08 ENCOUNTER — Ambulatory Visit: Admitting: Physical Therapy

## 2024-04-12 ENCOUNTER — Ambulatory Visit

## 2024-04-15 ENCOUNTER — Ambulatory Visit

## 2024-04-19 ENCOUNTER — Ambulatory Visit

## 2024-04-22 ENCOUNTER — Ambulatory Visit

## 2024-04-26 ENCOUNTER — Ambulatory Visit

## 2024-05-13 ENCOUNTER — Encounter: Payer: Self-pay | Admitting: Radiology

## 2024-05-13 DIAGNOSIS — Z03818 Encounter for observation for suspected exposure to other biological agents ruled out: Secondary | ICD-10-CM | POA: Diagnosis not present

## 2024-05-13 DIAGNOSIS — B349 Viral infection, unspecified: Secondary | ICD-10-CM | POA: Diagnosis not present

## 2024-05-13 DIAGNOSIS — R509 Fever, unspecified: Secondary | ICD-10-CM | POA: Diagnosis not present

## 2024-05-13 DIAGNOSIS — J029 Acute pharyngitis, unspecified: Secondary | ICD-10-CM | POA: Diagnosis not present

## 2024-05-13 DIAGNOSIS — R051 Acute cough: Secondary | ICD-10-CM | POA: Diagnosis not present

## 2024-05-21 DIAGNOSIS — M5440 Lumbago with sciatica, unspecified side: Secondary | ICD-10-CM | POA: Diagnosis not present

## 2024-05-21 DIAGNOSIS — Z6829 Body mass index (BMI) 29.0-29.9, adult: Secondary | ICD-10-CM | POA: Diagnosis not present

## 2024-06-04 DIAGNOSIS — M5416 Radiculopathy, lumbar region: Secondary | ICD-10-CM | POA: Diagnosis not present

## 2024-06-27 DIAGNOSIS — E1169 Type 2 diabetes mellitus with other specified complication: Secondary | ICD-10-CM | POA: Diagnosis not present

## 2024-08-27 ENCOUNTER — Ambulatory Visit: Admitting: Orthopaedic Surgery
